# Patient Record
Sex: Female | Born: 1955
Health system: Southern US, Community
[De-identification: ages and names within clinical notes are randomized; demographics above are authoritative.]

## PROBLEM LIST (undated history)

## (undated) DIAGNOSIS — I471 Supraventricular tachycardia: Secondary | ICD-10-CM

## (undated) DIAGNOSIS — I4892 Unspecified atrial flutter: Secondary | ICD-10-CM

## (undated) DIAGNOSIS — H15101 Unspecified episcleritis, right eye: Secondary | ICD-10-CM

## (undated) DIAGNOSIS — R9430 Abnormal result of cardiovascular function study, unspecified: Secondary | ICD-10-CM

## (undated) DIAGNOSIS — R232 Flushing: Secondary | ICD-10-CM

## (undated) DIAGNOSIS — I480 Paroxysmal atrial fibrillation: Secondary | ICD-10-CM

## (undated) DIAGNOSIS — K52832 Lymphocytic colitis: Secondary | ICD-10-CM

## (undated) DIAGNOSIS — H15001 Unspecified scleritis, right eye: Secondary | ICD-10-CM

## (undated) DIAGNOSIS — Z9289 Personal history of other medical treatment: Secondary | ICD-10-CM

## (undated) DIAGNOSIS — I10 Essential (primary) hypertension: Secondary | ICD-10-CM

## (undated) HISTORY — DX: Lymphocytic colitis: K52.832

## (undated) HISTORY — DX: Paroxysmal atrial fibrillation: I48.0

## (undated) HISTORY — PX: WRIST SURGERY: SHX841

## (undated) HISTORY — DX: Personal history of other medical treatment: Z92.89

## (undated) HISTORY — DX: Supraventricular tachycardia: I47.1

## (undated) HISTORY — DX: Abnormal result of cardiovascular function study, unspecified: R94.30

## (undated) HISTORY — DX: Unspecified atrial flutter: I48.92

## (undated) HISTORY — DX: Flushing: R23.2

## (undated) HISTORY — DX: Unspecified episcleritis, right eye: H15.101

## (undated) HISTORY — DX: Essential (primary) hypertension: I10

---
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
## 1988-03-05 HISTORY — PX: VARICOSE VEIN SURGERY: SHX832

## 2012-08-28 ENCOUNTER — Ambulatory Visit (INDEPENDENT_AMBULATORY_CARE_PROVIDER_SITE_OTHER): Payer: BC Managed Care – PPO | Admitting: Family Medicine

## 2012-08-28 ENCOUNTER — Encounter: Payer: Self-pay | Admitting: Family Medicine

## 2012-08-28 VITALS — BP 124/80 | Temp 98.3°F | Ht 62.25 in | Wt 171.0 lb

## 2012-08-28 DIAGNOSIS — H15001 Unspecified scleritis, right eye: Secondary | ICD-10-CM

## 2012-08-28 DIAGNOSIS — J329 Chronic sinusitis, unspecified: Secondary | ICD-10-CM

## 2012-08-28 DIAGNOSIS — R232 Flushing: Secondary | ICD-10-CM

## 2012-08-28 DIAGNOSIS — N951 Menopausal and female climacteric states: Secondary | ICD-10-CM

## 2012-08-28 DIAGNOSIS — Z803 Family history of malignant neoplasm of breast: Secondary | ICD-10-CM | POA: Insufficient documentation

## 2012-08-28 DIAGNOSIS — Z7689 Persons encountering health services in other specified circumstances: Secondary | ICD-10-CM

## 2012-08-28 DIAGNOSIS — H15009 Unspecified scleritis, unspecified eye: Secondary | ICD-10-CM

## 2012-08-28 DIAGNOSIS — H15101 Unspecified episcleritis, right eye: Secondary | ICD-10-CM

## 2012-08-28 DIAGNOSIS — Z7189 Other specified counseling: Secondary | ICD-10-CM

## 2012-08-28 HISTORY — DX: Unspecified scleritis, right eye: H15.001

## 2012-08-28 HISTORY — DX: Unspecified episcleritis, right eye: H15.101

## 2012-08-28 MED ORDER — AMOXICILLIN 875 MG PO TABS: 875.0000 mg | ORAL_TABLET | Freq: Two times a day (BID) | ORAL | Status: DC

## 2012-08-28 NOTE — Addendum Note (Signed)
Addended by: Terressa Koyanagi on: 08/28/2012 12:10 PM   Modules accepted: Level of Service

## 2012-08-28 NOTE — Patient Instructions (Addendum)
-  schedule your mammogram  -schedule physical with pap and breast exam  -As we discussed, we have prescribed a new medication for you at this appointment. We discussed the common and serious potential adverse effects of this medication and you can review these and more with the pharmacist when you pick up your medication.  Please follow the instructions for use carefully and notify us immediately if you have any problems taking this medication.  -We placed a referral for you as discussed. It usually takes about 1-2 weeks to process and schedule this referral. If you have not heard from [place] regarding this appointment in 2 weeks please contact our office.  -schedule your physical with pap in the next 1-2 months

## 2012-08-28 NOTE — Progress Notes (Addendum)
Chief Complaint  Patient presents with  . Establish Care  . Medication Management  . URI    achy, fever, mucus,congestion     HPI:  Veronica Valencia is here to establish care. Was seeing Merri Brunette recently, but wants to come here as husband is here. Husband sees me and struggled with alcoholism and depression but is doing better.  Last PCP and physical: almost 2 years ago - pap and mammo and all normal - had cholesterol and basic labs about several months ago and was normal  Has the following chronic problems and concerns today:  Patient Active Problem List   Diagnosis Date Noted  . Hot flashes 08/28/2012  . FH: breast cancer in first degree relative 08/28/2012  . Scleritis and episcleritis of right eye 08/28/2012   Hot Flashes: -on HRT norethindrone and ethinyl estradiol, on this for about 1 year - takes daily -aware of risks and does want to continue for now  Sinus congestion: -started 3-4 weeks ago -symptoms: nasal congestion, cough, PND - seems to be getting better now, sinus pain, a little aches  Health Maintenance: -needs physical, UTD on vaccines  ROS: See pertinent positives and negatives per HPI.  Past Medical History  Diagnosis Date  . Hot flashes   . Episcleritis of right eye     Family History  Problem Relation Age of Onset  . Diabetes Mother   . Hypertension Mother   . Heart disease Father 46    MI  . Stroke Father   . Hypertension Father   . Cancer Sister 74    breast    History   Social History  . Marital Status: Married    Spouse Name: N/A    Number of Children: N/A  . Years of Education: N/A   Social History Main Topics  . Smoking status: Never Smoker   . Smokeless tobacco: None  . Alcohol Use: Yes     Comment: couple of glasses of wine daily   . Drug Use: No  . Sexually Active: None   Other Topics Concern  . 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 , Disp: , Rfl: ;  prednisoLONE acetate (PRED FORTE) 1 % ophthalmic suspension, Place 1 drop into the right eye 4 (four) times daily. , Disp: , Rfl:   EXAM:  Filed Vitals:   08/28/12 1119  BP: 124/80  Temp: 98.3 F (36.8 C)    Body mass index is 31.03 kg/(m^2).  GENERAL: vitals reviewed and listed above, alert, oriented, appears well hydrated and in no acute distress  HEENT: atraumatic, conjunttiva clear, no obvious abnormalities on inspection of external nose and ears, normal appearance of ear canals and TMs, clear nasal congestion, mild post oropharyngeal erythema with PND, no tonsillar edema or exudate, no sinus TTP  NECK: no obvious masses on inspection  LUNGS: clear to auscultation bilaterally, no wheezes, rales or rhonchi, good air movement  CV: HRRR, no peripheral edema  MS: moves all extremities without noticeable abnormality  PSYCH:  pleasant and cooperative, no obvious depression or anxiety  ASSESSMENT AND PLAN:  Discussed the following assessment and plan:  Encounter to establish care  FH: breast cancer in first degree relative - Plan: Ambulatory referral to Genetics  Sinusitis - Plan: amoxicillin (AMOXIL) 875 MG tablet  Hot flashes  Scleritis and episcleritis of right eye   -We reviewed the PMH, PSH, FH, SH, Meds and Allergies. -We provided refills for any medications we will prescribe as needed. -We addressed current concerns per orders and patient instructions. -We have asked for records for pertinent exams, studies, vaccines and notes from previous providers. -We have advised patient to follow up per instructions below. -amox for sinusitis - risks  and return precautions discussed -follow up for CPE with pap 45 minutes spent face to face with this patient   -Patient advised to return or notify a doctor immediately if symptoms worsen or persist or new concerns arise.  Patient Instructions  -schedule your mammogram  -schedule physical with pap and breast exam  -As we discussed, we have prescribed a new medication for you at this appointment. We discussed the common and serious potential adverse effects of this medication and you can review these and more with the pharmacist when you pick up your medication.  Please follow the instructions for use carefully and notify us immediately if you have any problems taking this medication.  -We placed a referral for you as discussed. It usually takes about 1-2 weeks to process and schedule this referral. If you have not heard from [place] regarding this appointment in 2 weeks please contact our office.  -schedule your physical with pap in the next 1-2 months        KIM, HANNAH R.

## 2012-09-02 DIAGNOSIS — I480 Paroxysmal atrial fibrillation: Secondary | ICD-10-CM

## 2012-09-02 HISTORY — DX: Paroxysmal atrial fibrillation: I48.0

## 2012-09-19 DIAGNOSIS — I480 Paroxysmal atrial fibrillation: Secondary | ICD-10-CM | POA: Insufficient documentation

## 2012-09-23 ENCOUNTER — Encounter (HOSPITAL_COMMUNITY): Payer: Self-pay | Admitting: *Deleted

## 2012-09-23 ENCOUNTER — Emergency Department (HOSPITAL_COMMUNITY)
Admission: EM | Admit: 2012-09-23 | Discharge: 2012-09-23 | Disposition: A | Discharge status: Home / Self Care | Payer: BC Managed Care – PPO | Attending: Emergency Medicine | Admitting: Emergency Medicine

## 2012-09-23 ENCOUNTER — Telehealth: Payer: Self-pay | Admitting: Genetic Counselor

## 2012-09-23 ENCOUNTER — Emergency Department (HOSPITAL_COMMUNITY): Payer: BC Managed Care – PPO

## 2012-09-23 DIAGNOSIS — I498 Other specified cardiac arrhythmias: Secondary | ICD-10-CM | POA: Insufficient documentation

## 2012-09-23 DIAGNOSIS — I471 Supraventricular tachycardia: Secondary | ICD-10-CM

## 2012-09-23 DIAGNOSIS — Z792 Long term (current) use of antibiotics: Secondary | ICD-10-CM | POA: Insufficient documentation

## 2012-09-23 DIAGNOSIS — Z79899 Other long term (current) drug therapy: Secondary | ICD-10-CM | POA: Insufficient documentation

## 2012-09-23 DIAGNOSIS — Z8669 Personal history of other diseases of the nervous system and sense organs: Secondary | ICD-10-CM | POA: Insufficient documentation

## 2012-09-23 DIAGNOSIS — Z8742 Personal history of other diseases of the female genital tract: Secondary | ICD-10-CM | POA: Insufficient documentation

## 2012-09-23 DIAGNOSIS — Z791 Long term (current) use of non-steroidal anti-inflammatories (NSAID): Secondary | ICD-10-CM | POA: Insufficient documentation

## 2012-09-23 LAB — CBC WITH DIFFERENTIAL/PLATELET
Basophils Absolute: 0.1 10*3/uL (ref 0.0–0.1)
Basophils Relative: 1 % (ref 0–1)
Eosinophils Absolute: 0.1 10*3/uL (ref 0.0–0.7)
Eosinophils Relative: 1 % (ref 0–5)
HCT: 38.2 % (ref 36.0–46.0)
Hemoglobin: 13.7 g/dL (ref 12.0–15.0)
Lymphocytes Relative: 15 % (ref 12–46)
Lymphs Abs: 0.9 10*3/uL (ref 0.7–4.0)
MCH: 33.7 pg (ref 26.0–34.0)
MCHC: 35.9 g/dL (ref 30.0–36.0)
MCV: 94.1 fL (ref 78.0–100.0)
Monocytes Absolute: 1 10*3/uL (ref 0.1–1.0)
Monocytes Relative: 18 % — ABNORMAL HIGH (ref 3–12)
Neutro Abs: 3.9 10*3/uL (ref 1.7–7.7)
Neutrophils Relative %: 66 % (ref 43–77)
Platelets: 162 10*3/uL (ref 150–400)
RBC: 4.06 MIL/uL (ref 3.87–5.11)
RDW: 12.4 % (ref 11.5–15.5)
WBC: 5.9 10*3/uL (ref 4.0–10.5)

## 2012-09-23 LAB — BASIC METABOLIC PANEL
BUN: 15 mg/dL (ref 6–23)
CO2: 22 mEq/L (ref 19–32)
Calcium: 9.7 mg/dL (ref 8.4–10.5)
Chloride: 101 mEq/L (ref 96–112)
Creatinine, Ser: 0.47 mg/dL — ABNORMAL LOW (ref 0.50–1.10)
GFR calc Af Amer: >90 mL/min (ref >90)
GFR calc non Af Amer: >90 mL/min (ref >90)
Glucose, Bld: 103 mg/dL — ABNORMAL HIGH (ref 70–99)
Potassium: 4.1 mEq/L (ref 3.5–5.1)
Sodium: 135 mEq/L (ref 135–145)

## 2012-09-23 LAB — TSH: TSH: 1.262 u[IU]/mL (ref 0.350–4.500)

## 2012-09-23 LAB — TROPONIN I: Troponin I: <0.3 ng/mL (ref <0.30)

## 2012-09-23 MED ORDER — METOPROLOL TARTRATE 12.5 MG HALF TABLET: 25.0000 mg | ORAL_TABLET | Freq: Two times a day (BID) | ORAL | Status: DC

## 2012-09-23 NOTE — ED Provider Notes (Signed)
History    CSN: [id number] Arrival date & time 09/23/12  4098  First MD Initiated Contact with Patient 09/23/12 [phone]     Chief Complaint  Patient presents with  . Resolved SVT    (Consider location/radiation/quality/duration/timing/severity/associated sxs/prior Treatment) HPI Comments: 57 year old female with no significant past medical history who presents with a complaint of palpitations and chest pain. She states that this has been intermittent over the last several days, nothing seems to make it better or worse, it was severe this morning and when the paramedics found the patient she was diaphoretic with a heart rate of 200. According to the paramedics cardiac tracings she was in an SVT, narrow complex, regular and this resolved with bearing down vagal maneuvers. She states that a normal day for her is drinking approximately 3 cups of coffee in the morning followed by 2 diet sodas later in the day and 2-3 glasses of alcohol in the evening. At this time the patient has no chest pain, no shortness of breath, no other symptoms. She does note having 2 loose stools earlier today. She has no history of cardiac disease, takes no upper prescription medications and has not been on any over-the-counter medications in the last 3 weeks after having a sinus infection that was treated with sinus medications as well as an antibiotic.  The history is provided by the patient.   Past Medical History  Diagnosis Date  . Hot flashes   . Episcleritis of right eye    Past Surgical History  Procedure Laterality Date  . Varicose vein surgery  1990  . Cesarean section     Family History  Problem Relation Age of Onset  . Diabetes Mother   . Hypertension Mother   . Heart disease Father 70    MI  . Stroke Father   . Hypertension Father   . Cancer Sister 70    breast   History  Substance Use Topics  . Smoking status: Never Smoker   . Smokeless tobacco: Not on file  . Alcohol Use: Yes     Comment:  couple of glasses of wine daily    OB History   Grav Para Term Preterm Abortions TAB SAB Ect Mult Living                 Review of Systems  All other systems reviewed and are negative.    Allergies  Review of patient's allergies indicates no known allergies.  Home Medications   Current Outpatient Rx  Name  Route  Sig  Dispense  Refill  . JINTELI 1-5 MG-MCG TABS   Oral   Take 1 tablet by mouth every morning.          . naproxen sodium (ANAPROX) 220 MG tablet   Oral   Take 220 mg by mouth 2 (two) times daily with a meal.         . prednisoLONE acetate (PRED FORTE) 1 % ophthalmic suspension   Right Eye   Place 1 drop into the right eye 4 (four) times daily.          Marland Kitchen amoxicillin (AMOXIL) 875 MG tablet   Oral   Take 1 tablet (875 mg total) by mouth 2 (two) times daily.   20 tablet   0   . metoprolol (LOPRESSOR) 12.5 mg TABS   Oral   Take 1 tablet (25 mg total) by mouth 2 (two) times daily.   60 tablet   1    BP  142/81  Pulse 64  Temp(Src) 98.7 F (37.1 C) (Oral)  Resp 20  SpO2 98% Physical Exam  Nursing note and vitals reviewed. Constitutional: She appears well-developed and well-nourished. No distress.  HENT:  Head: Normocephalic and atraumatic.  Mouth/Throat: Oropharynx is clear and moist. No oropharyngeal exudate.  Eyes: Conjunctivae and EOM are normal. Pupils are equal, round, and reactive to light. Right eye exhibits no discharge. Left eye exhibits no discharge. No scleral icterus.  Neck: Normal range of motion. Neck supple. No JVD present. No thyromegaly present.  Cardiovascular: Normal rate, regular rhythm, normal heart sounds and intact distal pulses.  Exam reveals no gallop and no friction rub.   No murmur heard. Pulmonary/Chest: Effort normal and breath sounds normal. No respiratory distress. She has no wheezes. She has no rales.  Abdominal: Soft. Bowel sounds are normal. She exhibits no distension and no mass. There is no tenderness.   Musculoskeletal: Normal range of motion. She exhibits no edema and no tenderness.  Lymphadenopathy:    She has no cervical adenopathy.  Neurological: She is alert. Coordination normal.  Skin: Skin is warm and dry. No rash noted. No erythema.  Psychiatric: She has a normal mood and affect. Her behavior is normal.    ED Course  Procedures (including critical care time) Labs Reviewed  BASIC METABOLIC PANEL - Abnormal; Notable for the following:    Glucose, Bld 103 (*)    Creatinine, Ser 0.47 (*)    All other components within normal limits  CBC WITH DIFFERENTIAL  ETHANOL  TROPONIN I  TSH   Dg Chest 2 View  09/23/2012   *RADIOLOGY REPORT*  Clinical Data: Shortness of breath, palpitations.  CHEST - 2 VIEW  Comparison: None  Findings: Heart is upper limits normal in size.  Lungs are clear. No effusions or edema.  No acute bony abnormality.  IMPRESSION: No acute cardiopulmonary disease.   Original Report Authenticated By: Charlett Nose, M.D.   1. SVT (supraventricular tachycardia)     MDM  At this time the patient is a normal cardiac exam, she has no signs of thyroid dysfunction including no weight loss, no changes in her hair, no subacute or chronic changes in her bowel habits. She is afebrile, normal blood pressure, normal heart rate and her EKG shows no signs of WPW or other arrhythmia. Laboratory workup pending, TSH drawn, patient informed that she will need to followup as an outpatient for further testing and cardiology referral. She will likely require low-dose beta blocker treatment for prevention. I've arty cautioned her on her ingestions including the amount of stimulant that she takes them everyday. She has expressed her understanding.  ED ECG REPORT  I personally interpreted this EKG   Date: 09/23/2012   Rate: 85  Rhythm: normal sinus rhythm  QRS Axis: left  Intervals: normal  ST/T Wave abnormalities: normal  Conduction Disutrbances:none  Narrative Interpretation:  - no WPW   Old EKG Reviewed: none available  Rhythm strips from EMS according to my interpretation show that she had a regular narrow complex tachycardia without obvious P waves.  The post vagal maneuvers ECG show normal sinus rhythm.  I have personally seen and interpreted the chest x-ray which is a 2 view PA and lateral, there is no signs of infiltrate asymmetry, pneumothorax, abnormal mediastinum or other significant abnormalities.  No further arrhythmias while in the ED - I prescribed metoprolol, the patient will be discharged. At this time we do not have a CBC vailable to [place] as the  machine is broken in the lab according to the lab technician.  The patient does not appear acutely anemic or in distress, her family doctor can followup on this test as well as her TSH.  Vida Roller, MD 09/23/12 1146

## 2012-09-23 NOTE — Progress Notes (Signed)
While in ED  Pt.'s friend requested that I visit with pt. to provide emotional, spiritual support and ministry of presence. Pt was experiencing  high levels of anxiety. I encouraged pt. through presence, conversation and listening. Friend is at bedside offering support and comfort.  Will follow as needed.   09/23/12 1100  Clinical Encounter Type  Visited With Patient;Health care provider;Other (Comment) (Personal Friend at bedside)  Visit Type Initial;Spiritual support;ED  Referral From Other (Comment) (Pt.'s friend)  Spiritual Encounters  Spiritual Needs Emotional  Stress Factors  Patient Stress Factors Exhausted  Veronica Valencia [place] [phone]

## 2012-09-23 NOTE — Telephone Encounter (Signed)
LVOM FOR PT TO RETURN CALL IN RE TO GENETIC APPT.  °

## 2012-09-23 NOTE — ED Notes (Signed)
Pt has been having couple days of intermittent chest pain and on arrival by ems pt was diaphoretic with HR 270.  HR decreased to 80 with bearing down.  Pt usually takes 3 cups coffee per day and diet sodas.

## 2012-09-26 ENCOUNTER — Encounter: Payer: Self-pay | Admitting: Family Medicine

## 2012-09-26 ENCOUNTER — Ambulatory Visit (INDEPENDENT_AMBULATORY_CARE_PROVIDER_SITE_OTHER): Payer: BC Managed Care – PPO | Admitting: Family Medicine

## 2012-09-26 VITALS — BP 100/70 | Temp 97.5°F | Wt 169.0 lb

## 2012-09-26 DIAGNOSIS — R232 Flushing: Secondary | ICD-10-CM

## 2012-09-26 DIAGNOSIS — N951 Menopausal and female climacteric states: Secondary | ICD-10-CM

## 2012-09-26 DIAGNOSIS — I498 Other specified cardiac arrhythmias: Secondary | ICD-10-CM

## 2012-09-26 DIAGNOSIS — R079 Chest pain, unspecified: Secondary | ICD-10-CM

## 2012-09-26 DIAGNOSIS — I471 Supraventricular tachycardia: Secondary | ICD-10-CM

## 2012-09-26 MED ORDER — NORETHINDRONE-ETH ESTRADIOL 1-5 MG-MCG PO TABS: 1.0000 | ORAL_TABLET | Freq: Every morning | ORAL | Status: DC

## 2012-09-26 NOTE — Progress Notes (Signed)
Chief Complaint  Patient presents with  . Hospitalization Follow-up    HPI:  ED follow up for tachycardia: -taken to ED on 09/23/12 by EMS for CP, back and neck pain, palpitations and per ED report diaphoretic with HR in 200 with narrow regular SVT -apparently this resolved with vagal maneuvers -EKG in ED was normal per report with NSR -labs/studies in ED included CXR,TSH, ethanol, CBC, BMP one set cardiac enzymes all ok -she was told needed outpt follow up scheduled with cardiology and wants referral -she reports: she reports had one more episode of racing heart when got home from ED, then then started metoprolol 12.5 bid; she wants to see a cardiologist for evaluation - she has had no further episodes since starting metoprolol and denies CP, SOB, DOE, palpitations, swelling -she has not been under more stress then usually; no anxiety or panic at the time, she was drinking 2 cups of coffee per day and 3 sodas - but she has cut back significantly - but had been doing this forever and had only had 1 cup of coffee the day this happened  ROS: See pertinent positives and negatives per HPI.  Past Medical History  Diagnosis Date  . Hot flashes   . Episcleritis of right eye     Family History  Problem Relation Age of Onset  . Diabetes Mother   . Hypertension Mother   . Heart disease Father 75    MI  . Stroke Father   . Hypertension Father   . Cancer Sister 2    breast    History   Social History  . Marital Status: Married    Spouse Name: N/A    Number of Children: N/A  . Years of Education: N/A   Social History Main Topics  . Smoking status: Never Smoker   . Smokeless tobacco: None  . Alcohol Use: Yes     Comment: couple of glasses of wine daily   . Drug Use: No  . Sexually Active: None   Other Topics Concern  . 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      Spiritual Beliefs: Christian       Lifestyle: hour of walking daily, free weights; healthy diet             Current outpatient prescriptions:metoprolol (LOPRESSOR) 12.5 mg TABS, Take 1 tablet (25 mg total) by mouth 2 (two) times daily., Disp: 60 tablet, Rfl: 1;  naproxen sodium (ANAPROX) 220 MG tablet, Take 220 mg by mouth 2 (two) times daily with a meal., Disp: , Rfl: ;  norethindrone-ethinyl estradiol (JINTELI) 1-5 MG-MCG TABS, Take 1 tablet by mouth every morning., Disp: 28 tablet, Rfl: 2 prednisoLONE acetate (PRED FORTE) 1 % ophthalmic suspension, Place 1 drop into the right eye 4 (four) times daily. , Disp: , Rfl:   EXAM:  Filed Vitals:   09/26/12 1020  BP: 100/70  Temp: 97.5 F (36.4 C)    Body mass index is 30.67 kg/(m^2).  GENERAL: vitals reviewed and listed above, alert, oriented, appears well hydrated and in no acute distress  HEENT: atraumatic, conjunttiva clear, no obvious abnormalities on inspection of external nose and ears  NECK: no obvious masses on inspection  LUNGS: clear to auscultation bilaterally, no wheezes, rales or rhonchi, good air movement  CV: HRRR, no peripheral edema  MS: moves all extremities without noticeable abnormality  PSYCH: pleasant and  cooperative, no obvious depression or anxiety  ASSESSMENT AND PLAN:  Discussed the following assessment and plan:  Chest pain - Plan: Ambulatory referral to Cardiology, CANCELED: Ambulatory referral to Cardiology  SVT (supraventricular tachycardia) - Plan: Ambulatory referral to Cardiology, CANCELED: Ambulatory referral to Cardiology -reviewed ED notes/labs -she is doing well today and feels fine; we discussed potential etiologies and will have her see the cardiologist - referral place -ED precuations  Hot flashes - Plan: norethindrone-ethinyl estradiol (JINTELI) 1-5 MG-MCG TABS -discussed risks including CV risks - but she reports life is unbearable without this due to severe hot flashes and begged to continue for now   -Patient  advised to return or notify a doctor immediately if symptoms worsen or persist or new concerns arise.  There are no Patient Instructions on file for this visit.   Kriste Basque R.

## 2012-09-30 ENCOUNTER — Telehealth: Payer: Self-pay | Admitting: Family Medicine

## 2012-09-30 NOTE — Telephone Encounter (Signed)
Please check the status of the referral for this patient

## 2012-09-30 NOTE — Telephone Encounter (Signed)
What is the status of the referral? Ensure it it is urgent.

## 2012-09-30 NOTE — Telephone Encounter (Signed)
Spoke with patient and she feels "okay now".  She has agreed if her symptoms return that she will go to the ER.

## 2012-09-30 NOTE — Telephone Encounter (Signed)
Caller: Veronica Valencia/Patient; Phone: [phone]; Reason for Call: Pt states she was seen in the ER on 09/23/12 for rapid heart rate (HR=210).  Pt was seen by Kriste Basque on 09/26/12.  Pt is going to have cardiology referral.  Pt is calling to report that this am she had fatigue and knot in chest.  BP was 145/98 and HR was 85 and irregular.  Pt states symptoms have resolved now.  Pt declines triage and does not want to come back to office.  Pt states she is calling to report her symptoms and see if referral can be expidited.  OFFICE, please follow up with patient

## 2012-09-30 NOTE — Telephone Encounter (Signed)
If having CP or irr heart beat needs to go to ED. Please see on check on status of referral and ensure urgent, but in meantime if CP or heart arrhythmia needs to go to ED.

## 2012-10-01 NOTE — Telephone Encounter (Signed)
Pt is scheduled for 7/31@ 10:00 at [facility].Lmom for pt

## 2012-10-02 ENCOUNTER — Ambulatory Visit: Payer: BC Managed Care – PPO | Admitting: Internal Medicine

## 2012-10-03 DIAGNOSIS — I4892 Unspecified atrial flutter: Secondary | ICD-10-CM

## 2012-10-03 HISTORY — DX: Unspecified atrial flutter: I48.92

## 2012-10-06 ENCOUNTER — Encounter: Payer: Self-pay | Admitting: Cardiology

## 2012-10-06 ENCOUNTER — Telehealth: Payer: Self-pay | Admitting: Internal Medicine

## 2012-10-06 ENCOUNTER — Ambulatory Visit (INDEPENDENT_AMBULATORY_CARE_PROVIDER_SITE_OTHER): Payer: BC Managed Care – PPO | Admitting: Cardiology

## 2012-10-06 VITALS — BP 128/82 | HR 133 | Ht 62.5 in | Wt 170.9 lb

## 2012-10-06 DIAGNOSIS — I471 Supraventricular tachycardia, unspecified: Secondary | ICD-10-CM | POA: Insufficient documentation

## 2012-10-06 DIAGNOSIS — I341 Nonrheumatic mitral (valve) prolapse: Secondary | ICD-10-CM

## 2012-10-06 DIAGNOSIS — R011 Cardiac murmur, unspecified: Secondary | ICD-10-CM

## 2012-10-06 DIAGNOSIS — I059 Rheumatic mitral valve disease, unspecified: Secondary | ICD-10-CM

## 2012-10-06 DIAGNOSIS — I498 Other specified cardiac arrhythmias: Secondary | ICD-10-CM

## 2012-10-06 DIAGNOSIS — I358 Other nonrheumatic aortic valve disorders: Secondary | ICD-10-CM | POA: Insufficient documentation

## 2012-10-06 DIAGNOSIS — I4892 Unspecified atrial flutter: Secondary | ICD-10-CM | POA: Insufficient documentation

## 2012-10-06 HISTORY — DX: Supraventricular tachycardia, unspecified: I47.10

## 2012-10-06 HISTORY — DX: Supraventricular tachycardia: I47.1

## 2012-10-06 MED ORDER — METOPROLOL TARTRATE 25 MG PO TABS: 25.0000 mg | ORAL_TABLET | Freq: Two times a day (BID) | ORAL | Status: DC

## 2012-10-06 NOTE — Telephone Encounter (Signed)
New problem   Tina/Dr Dorethea Clan stated Dr Dorethea Clan wanted pt to be seen ASAP the only appt i could give was 11/17/12 and I advised office I would give message to nurse and have someone look at notes in epic to determine if pt could be seen sooner. Please call Inetta Fermo concerning this matter.

## 2012-10-06 NOTE — Assessment & Plan Note (Signed)
Unusual that she would have EMS at the [facility], and shortly after had EKG suggestive of atrial flutter (even consideration atrial fibrillation). Although her EKG is read as atrial flutter, and does appear to be, her rapid heart rate broke with Valsalva and is very unusual for atrial flutter.  Plan: See above, increase beta blocker dose therapy, and referral to EP.

## 2012-10-06 NOTE — Assessment & Plan Note (Addendum)
I have no reason to doubt a heart rate in the 200 beats per minute range probably was not atrial flutter, and therefore was most likely a true SVT.  She's had at least 2 more shorter episodes while on beta blocker, presents here with an ECG showing atrial flutter.  I am concerned with her symptoms and fatigue associated with these episodes, and frequency over last couple weeks. He should be a great candidate to consider EP study with possible SVT plus or minus atrial flutter ablation.  Plan: Increase metoprolol 25 mg twice a day.  Referral to [facility] for evaluation and possible treatment/consideration of ablation. Reduce caffeine intake even further.   Ensure adequate hydration. CardioNet event monitor, determine her arrhythmia burden, rates, and variability.

## 2012-10-06 NOTE — Assessment & Plan Note (Signed)
I cannot be sure, due to the irregularity of her examination, however the different occasions and all holosystolic murmur with also +/- mitral click warrants evaluation with echocardiogram.

## 2012-10-06 NOTE — Assessment & Plan Note (Signed)
Her heart exam is actually relatively difficult because of how much change in the. When she was slow, it was a soft. Murmur and either a delayed split S2, S4 or a mitral click.  Simply based on her arrhythmias, with the potential for prolapse this should be investigated further.  Plan: Daily echocardiogram.

## 2012-10-06 NOTE — Patient Instructions (Addendum)
Your physician has recommended that you wear an event monitor. Event monitors are medical devices that record the heart's electrical activity. Doctors most often [place] these monitors to diagnose arrhythmias. Arrhythmias are problems with the speed or rhythm of the heartbeat. The monitor is a small, portable device. You can wear one while you do your normal daily activities. This is usually used to diagnose what is causing palpitations/syncope (passing out).  Your physician has requested that you have an echocardiogram. Echocardiography is a painless test that uses sound waves to create images of your heart. It provides your doctor with information about the size and shape of your heart and how well your heart's chambers and valves are working. This procedure takes approximately one hour. There are no restrictions for this procedure.   Change Metoprolol Tart 25 mg twice a day   You have been referred to [facility] Electrophysicologist-irregular heart beat

## 2012-10-06 NOTE — Progress Notes (Signed)
Patient ID: Veronica Valencia, female   DOB: 04-Jul-1955, 57 y.o.   MRN: [id number]  PCP: Terressa Koyanagi., DO  Clinic Note: No chief complaint on file.   HPI: Veronica Valencia is a 57 y.o. female with a PMH below who presents today for initial evaluation for rapid heart rates including supraventricular tachycardia as well as other episodes of irregular heartbeats and shortness of breath. She was initially seen in the emergency room on July 22, after being found to be in SVT by EMS with a heart rate in the 200s. This was relieved by vagal maneuvers. She noted that she had palpitations and chest discomfort for last several days before that., Usually at night when she was ready to go to sleep, or morning she was about ready to wake up. It gave her ER visit, she did note any/saw him morning followed by her usual 2 cans of diet soda. She also couple glasses all the usual course. She denied any additional social stressors or other areas of concern over the past weeks. Nothing unusual. She was discharged from the emergency room on Metoprolol Tartrate 12.5 mg twice a day.  Interval History: Since anesthesia a least 2 more occasions a prolonged fast heart rates, that didn't last as long the night she went to the hospital. Every night since then, she has had it is somewhat irregular heartbeats but not the long necessarily prolonged episodes. When the episodes do occur, she feels anxious short of breath, flushed, cool and clammy. She notes discomfort in her chest pressure.  When she got emergency room that night she told not talking. As you to one cup of coffee a and 1 diet drink (TAB) a day. In addition he is also noted that she can walk about 3 time and not really a problem, however when she is walking up hill or up steps she feels short of breath and fatigue. She does not get chest pain. She does as a result not do so activities use in the past. She simply tires easier than was her usual. She also describes having  episodes that are often 3 to 4 in the morning when she wakes up with a knot-like feeling in her chest that takes her breath away.  Because she wakes up with it, she is not sure if this just her feeling palpitations.  With these episodes, she denies feeling symptoms of of syncope, but does feel as if she may pass out.  The remainder of cardiac review of systems is as follows: Cardiovascular ROS: negative for - edema, loss of consciousness, murmur, orthopnea, paroxysmal nocturnal dyspnea or Lightheadedness, dizziness. No TIA or amaurosis fugax symptoms.  No melena, hematochezia.  Past Medical History  Diagnosis Date  . Hot flashes   . Episcleritis of right eye     Prior Cardiac Evaluation and Past Surgical History: Past Surgical History  Procedure Laterality Date  . Varicose vein surgery  1990  . Cesarean section     No Known Allergies  Current Outpatient Prescriptions  Medication Sig Dispense Refill  . metoprolol (LOPRESSOR) 12.5 mg TABS Take 1 tablet (25 mg total) by mouth 2 (two) times daily.  60 tablet  1  . naproxen sodium (ANAPROX) 220 MG tablet Take 220 mg by mouth as needed.       . norethindrone-ethinyl estradiol (JINTELI) 1-5 MG-MCG TABS Take 1 tablet by mouth every morning.  28 tablet  2  . metoprolol tartrate (LOPRESSOR) 25 MG tablet Take 1 tablet (25 mg  total) by mouth 2 (two) times daily.  60 tablet  11   No current facility-administered medications for this visit.    History   Social History  . Marital Status: Married    Spouse Name: N/A    Number of Children: N/A  . Years of Education: N/A   Occupational History  . Not on file.   Social History Main Topics  . Smoking status: Never Smoker   . Smokeless tobacco: Not on file  . Alcohol Use: Yes     Comment: couple of glasses of wine daily   . Drug Use: No  . Sexually Active: Not on file   Other Topics Concern  . Not on file   Social History Narrative   She is a married mother of 3. 30 years she had a  slow knee 14 days a week using 1.   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   Family History  Problem Relation Age of Onset  . Diabetes Mother   . Hypertension Mother   . Stroke Father   . Hypertension Father   . Cancer Sister 22    breast  . Heart attack Father 79   ROS: A comprehensive Review of Systems - Negative except Pertinent positives noted above. She did note having a few loose stools on the day she went to the emergency room.  PHYSICAL EXAM  BP 128/82  Pulse 133  Ht 5' 2.5" (1.588 m)  Wt 170 lb 14.4 oz (77.52 kg)  BMI 30.74 kg/m2; HR broke to SR / SBrady 55-60 during the visit.  General appearance: alert, cooperative, appears stated age, no distress and pleasant mood and affect; when questioning her further, she does note feeling somewhat clammy and lives breath with change in her chest, initial examination  HEENT: [place]/AT, EOMI, MMM, anicteric sclera  Neck: no adenopathy, no carotid bruit, no JVD, supple, symmetrical, trachea midline and thyroid not enlarged, symmetric, no tenderness/mass/nodules  Lungs: clear to auscultation bilaterally, normal percussion bilaterally and nonlabored air movement  Heart: On initial examination, she had a tachycardic regular rhythm that is too fast hear any extra heart sounds. Following carotid massage and call then became regular and somewhat slower, there is possible very soft murmur was mostly holosystolic in nature, this is also associated with either a split S2 or possible mitral click.  --> When I rechecked her symptoms later, she was back rapidly with leads in the 130s. This again was broken with Valsalva after several attempts; finally she went back rapidly and in written capture rhythm strip will possible atrial fibrillation however more likely to be longer walk and  or sinus or. She had a couple of PACs and has remained in sinus bradycardia/sinus rhythm until she left the office   Abdomen: soft, non-tender; bowel sounds normal; no masses,  no organomegaly  Extremities: extremities normal, atraumatic, no cyanosis or edema and she did feel somewhat cool and clammy on initial evaluation, but this is her heart rate with a normal this resolved  Pulses: 2+ and symmetric; initially rapid and very, however when regular with a normal.  Skin: Skin color, texture, turgor normal. No rashes or lesions  Neurologic: Alert and oriented X 3, normal strength and tone. Normal symmetric reflexes. Normal coordination and gait  [id number] today: Yes Rate:133 , Rhythm:  Atrial flutter with 21 block, left axis deviation, nonspecific IVCD. --> EKG she clearly appear to be atrial flutter, however it broke to sinus rhythm with vagal maneuvers, and carotid massage.  ECG at a computer as atrial flutter with very LOC, PVCs left he bases and there is a short pulse. ECG showed a similar finding but was read as atrial fibrillation with RVR. -- The ECGs are all scanned in Epic  Recent Labs: 09/23/2012  TSH 1.26  CBC: WBC 5.9, Hemoglobin 13.7, platelets 162  BMP: Sodium 135, potassium 4.41, CO2 22, BUN 15, creatinine 0.7, calcium 9.7, glucose 103  ASSESSMENT / PLAN: Very interesting presentation. Unfortunately I do not have any EMS strips showing her in SVT was broken by Valsalva. Here in the clinic she had clearly a similar spell, albeit not as fast.  Paroxysmal SVT (supraventricular tachycardia) I have no reason to doubt a heart rate in the 200 beats per minute range probably was not atrial flutter, and therefore was most likely a true SVT.  She's had at least 2 more shorter episodes while on beta blocker, presents here with an ECG showing atrial flutter.  I am concerned with her symptoms and fatigue associated with these episodes, and frequency over last couple weeks. He should  be a great candidate to consider EP study with possible SVT plus or minus atrial flutter ablation.  Plan: Increase metoprolol 25 mg twice a day.  Referral to [facility] for evaluation and possible treatment/consideration of ablation. Reduce caffeine intake even further.   Ensure adequate hydration. CardioNet event monitor, determine her arrhythmia burden, rates, and variability.  Atrial flutter Unusual that she would have EMS at the [facility], and shortly after had EKG suggestive of atrial flutter (even consideration atrial fibrillation). Although her EKG is read as atrial flutter, and does appear to be, her rapid heart rate broke with Valsalva and is very unusual for atrial flutter.  Plan: See above, increase beta blocker dose therapy, and referral to EP.  Murmur, cardiac Her heart exam is actually relatively difficult because of how much change in the. When she was slow, it was a soft. Murmur and either a delayed split S2, S4 or a mitral click.  Simply based on her arrhythmias, with the potential for prolapse this should be investigated further.  Plan: Daily echocardiogram.  MVP (mitral valve prolapse) I cannot be sure, due to the irregularity of her examination, however the different occasions and all holosystolic murmur with also +/- mitral click warrants evaluation with echocardiogram.    Orders Placed This Encounter  Procedures  . Ambulatory referral to Cardiac Electrophysiology    Referral Priority:  Routine    Referral Type:  Consultation    Referral Reason:  Specialty Services Required    Requested Specialty:  Cardiology    Number of Visits Requested:  1  . EKG 12-Lead  . Cardiac event monitor    SVT, AFLUTTER    Standing Status: Future     Number of Occurrences: 1     Standing Expiration Date: 10/06/2013    Order Specific Question:  Where should this test be performed    Answer:  [facility] [place]  . 2D Echocardiogram  without contrast    Possible MVP,murmur    Standing Status: Future     Number of Occurrences:      Standing Expiration Date: 10/06/2013    Order Specific Question:  Type of Echo    Answer:  Complete  Order Specific Question:  Where should this test be performed    Answer:  [facility]    Order Specific Question:  Reason for exam-Echo    Answer:  Murmur  785.2    Order Specific Question:  Reason for exam-Echo    Answer:  Atrial Flutter  427.32    Order Specific Question:  Reason for exam-Echo    Answer:  Other - See Comments Section   Meds ordered this encounter  Medications  . metoprolol tartrate (LOPRESSOR) 25 MG tablet    Sig: Take 1 tablet (25 mg total) by mouth 2 (two) times daily.    Dispense:  60 tablet    Refill:  11    Followup: 4-6 weeks  Veronica Valencia W. Herbie Baltimore, M.D., M.S. [facility] [address] [place], [place]  [address]  [phone] Pager # [phone]

## 2012-10-08 ENCOUNTER — Telehealth: Payer: Self-pay | Admitting: Cardiology

## 2012-10-08 DIAGNOSIS — I4892 Unspecified atrial flutter: Secondary | ICD-10-CM

## 2012-10-08 NOTE — Telephone Encounter (Signed)
High HR Afib 198 lasting 75 secs.  Will fax report

## 2012-10-08 NOTE — Telephone Encounter (Signed)
New onset Afib and High HR Afib >180 lasting ~33 secs.  Fax received and HR 85 and 100.  Call to [facility] and asked to fax report showing High HR Afib > 180.  Informed by Shanda Bumps that pt did not have High HR Afib.  Stated highest HR was 155 and lasted 11 secs.  Stated call was to report new onset Afib.  Message forwarded to Dr. Herbie Baltimore.  Will notify Extender this afternoon to view strip (on Triage Cart).

## 2012-10-08 NOTE — Telephone Encounter (Signed)
OK -> as long as the episodes are short, no acute Rx needed.   We just increased her BB dose.

## 2012-10-08 NOTE — Telephone Encounter (Signed)
No message

## 2012-10-10 NOTE — Telephone Encounter (Signed)
[facility]  High HR Afib 154 lasted 1 min 15 sec.  Will fax report.

## 2012-10-10 NOTE — Telephone Encounter (Signed)
No treatment at this time.  Continue to monitor.

## 2012-10-10 NOTE — Telephone Encounter (Signed)
Message forwarded to L. Annie Paras, NP for review.

## 2012-10-13 ENCOUNTER — Telehealth: Payer: Self-pay | Admitting: *Deleted

## 2012-10-13 NOTE — Telephone Encounter (Signed)
Instructed patient per Vernona Rieger, NP on medication changes. Lopressor 25mg  q8h (change from Lopressor 25mg  BID)

## 2012-10-13 NOTE — Telephone Encounter (Signed)
Per Nada Boozer, NP - patient needs to take Lopressor 25mg  q8h for frequent PAF; left VM on 10/13/12

## 2012-10-13 NOTE — Telephone Encounter (Signed)
Message copied by Lindell Spar on Mon Oct 13, 2012 12:55 PM ------      Message from: Leone Brand      Created: Mon Oct 13, 2012  9:52 AM       Have pt take lopressor 25 mg every 8 hours for more frequent episodes of PAF. ------

## 2012-10-13 NOTE — Telephone Encounter (Signed)
Message copied by Lindell Spar on Mon Oct 13, 2012 11:10 AM ------      Message from: Veronica Valencia      Created: Mon Oct 13, 2012  9:52 AM       Have pt take lopressor 25 mg every 8 hours for more frequent episodes of PAF. ------

## 2012-10-13 NOTE — Telephone Encounter (Signed)
Message copied by Lindell Spar on Mon Oct 13, 2012 10:01 AM ------      Message from: Leone Brand      Created: Mon Oct 13, 2012  9:52 AM       Have pt take lopressor 25 mg every 8 hours for more frequent episodes of PAF. ------

## 2012-10-14 ENCOUNTER — Ambulatory Visit (HOSPITAL_COMMUNITY)
Admission: RE | Admit: 2012-10-14 | Discharge: 2012-10-14 | Disposition: A | Discharge status: Home / Self Care | Payer: BC Managed Care – PPO | Source: Ambulatory Visit | Attending: Cardiovascular Disease | Admitting: Cardiovascular Disease

## 2012-10-14 DIAGNOSIS — I517 Cardiomegaly: Secondary | ICD-10-CM | POA: Insufficient documentation

## 2012-10-14 DIAGNOSIS — I379 Nonrheumatic pulmonary valve disorder, unspecified: Secondary | ICD-10-CM | POA: Insufficient documentation

## 2012-10-14 DIAGNOSIS — I4892 Unspecified atrial flutter: Secondary | ICD-10-CM | POA: Insufficient documentation

## 2012-10-14 DIAGNOSIS — R0609 Other forms of dyspnea: Secondary | ICD-10-CM | POA: Insufficient documentation

## 2012-10-14 DIAGNOSIS — I498 Other specified cardiac arrhythmias: Secondary | ICD-10-CM | POA: Insufficient documentation

## 2012-10-14 DIAGNOSIS — R0989 Other specified symptoms and signs involving the circulatory and respiratory systems: Secondary | ICD-10-CM | POA: Insufficient documentation

## 2012-10-14 DIAGNOSIS — I471 Supraventricular tachycardia: Secondary | ICD-10-CM

## 2012-10-14 DIAGNOSIS — R011 Cardiac murmur, unspecified: Secondary | ICD-10-CM

## 2012-10-14 DIAGNOSIS — I359 Nonrheumatic aortic valve disorder, unspecified: Secondary | ICD-10-CM | POA: Insufficient documentation

## 2012-10-14 DIAGNOSIS — I079 Rheumatic tricuspid valve disease, unspecified: Secondary | ICD-10-CM | POA: Insufficient documentation

## 2012-10-14 DIAGNOSIS — I341 Nonrheumatic mitral (valve) prolapse: Secondary | ICD-10-CM

## 2012-10-14 NOTE — Progress Notes (Signed)
[facility]   2D echo completed 10/14/2012.   Cindy Yecheskel Kurek, RDCS  

## 2012-10-21 ENCOUNTER — Encounter: Payer: Self-pay | Admitting: Family Medicine

## 2012-10-21 ENCOUNTER — Other Ambulatory Visit (HOSPITAL_COMMUNITY)
Admission: RE | Admit: 2012-10-21 | Discharge: 2012-10-21 | Disposition: A | Discharge status: Home / Self Care | Payer: BC Managed Care – PPO | Source: Ambulatory Visit | Attending: Family Medicine | Admitting: Family Medicine

## 2012-10-21 ENCOUNTER — Ambulatory Visit (INDEPENDENT_AMBULATORY_CARE_PROVIDER_SITE_OTHER): Payer: BC Managed Care – PPO | Admitting: Family Medicine

## 2012-10-21 VITALS — BP 130/80 | Temp 97.6°F | Ht 62.25 in | Wt 171.0 lb

## 2012-10-21 DIAGNOSIS — Z1151 Encounter for screening for human papillomavirus (HPV): Secondary | ICD-10-CM | POA: Insufficient documentation

## 2012-10-21 DIAGNOSIS — Z0000 Encounter for general adult medical examination without abnormal findings: Secondary | ICD-10-CM

## 2012-10-21 DIAGNOSIS — R232 Flushing: Secondary | ICD-10-CM

## 2012-10-21 DIAGNOSIS — I4892 Unspecified atrial flutter: Secondary | ICD-10-CM

## 2012-10-21 DIAGNOSIS — Z01419 Encounter for gynecological examination (general) (routine) without abnormal findings: Secondary | ICD-10-CM | POA: Insufficient documentation

## 2012-10-21 DIAGNOSIS — N951 Menopausal and female climacteric states: Secondary | ICD-10-CM

## 2012-10-21 DIAGNOSIS — I471 Supraventricular tachycardia: Secondary | ICD-10-CM

## 2012-10-21 LAB — LIPID PANEL
Cholesterol: 180 mg/dL (ref 0–200)
LDL Cholesterol: 114 mg/dL — ABNORMAL HIGH (ref 0–99)
Total CHOL/HDL Ratio: 4
VLDL: 24.8 mg/dL (ref 0.0–40.0)

## 2012-10-21 NOTE — Progress Notes (Signed)
Quick Note:  Left a detailed message for pt at designated cell phone number. ______ 

## 2012-10-21 NOTE — Progress Notes (Signed)
Chief Complaint  Patient presents with  . Annual Exam    HPI:  Here for CPE:  -Concerns today:  1) palpitations/irr heart beat: -saw [facility] cards - metop increased, referred to EP, echo, monitor -per phone notes, frequent PAF - metop increased to 25mg  q 8 -Echo ok  -reports: doing better, occ DOE - has appt with cards next week and with EP the week after -denies: frequent palpitations since increased metoprolol, CP, SOB  2)HRT for hot flashes: -severe hot flashes of HRT -wants to continue til after wedding in a few months then try taper off -understands risks  -Diet: variety of foods, balance and well rounded  -Taking vitamin D and calcium: multivitamin  -Exercise: yes, regular  -Diabetes and Dyslipidemia Screening: done about 1 year ago per her report and normal  -Hx of HTN: no  -Vaccines: UTD  -pap history: 2- 3 years ago normal per report, all normal in the past  -FDLMP: N/A postmenopausal, hot flashes on HRT - aware of risks and prefers to continue due to severe hot flashes when, no bleeding  -sexual activity: yes, female partner, no new partners  -wants STI testing: no  -FH breast, colon or ovarian ca: see FH -last mammo: can't rememeber last mammo -last colon canncer screening - had colonoscopy at age 76 and normal per her report  -Alcohol, Tobacco, drug use: see social history  Review of Systems - complete ros negative except where noted above  Past Medical History  Diagnosis Date  . Hot flashes   . Episcleritis of right eye     Family History  Problem Relation Age of Onset  . Diabetes Mother   . Hypertension Mother   . Stroke Father   . Hypertension Father   . Cancer Sister 78    breast  . Heart attack Father 62    History   Social History  . Marital Status: Married    Spouse Name: N/A    Number of Children: N/A  . Years of Education: N/A   Social History Main Topics  . Smoking status: Never Smoker   . Smokeless tobacco: None  . Alcohol  Use: Yes     Comment: couple of glasses of wine daily   . Drug Use: No  . Sexual Activity: None   Other Topics Concern  . 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    Current outpatient prescriptions:metoprolol tartrate (LOPRESSOR) 25 MG tablet, Take 25 mg by mouth 3 (three) times daily. , Disp: , Rfl: ;  naproxen sodium (ANAPROX) 220 MG tablet, Take 220 mg by mouth as needed. , Disp: , Rfl: ;  norethindrone-ethinyl estradiol (JINTELI) 1-5 MG-MCG TABS, Take 1 tablet by mouth every morning., Disp: 28 tablet, Rfl: 2  EXAM:  Filed Vitals:   10/21/12 0944  BP: 130/80  Temp: 97.6 F (36.4 C)    GENERAL: vitals reviewed and listed below, alert, oriented, appears well hydrated and in no acute distress  HEENT: head atraumatic, PERRLA, normal appearance of eyes, ears, nose and mouth. moist mucus membranes.  NECK: supple, no masses or lymphadenopathy  LUNGS: clear to auscultation bilaterally, no rales, rhonchi or wheeze  CV: HRRR, no peripheral edema or cyanosis, normal  pedal pulses  BREAST: normal appearance - no lesions or discharge, on palpation normal breast tissue without any suspicious masses  ABDOMEN: bowel sounds normal, soft, non tender to palpation, no masses, no rebound or guarding  GU: normal appearance of external genitalia - no lesions or masses, normal vaginal mucosa - no abnormal discharge, normal appearance of cervix - no lesions or abnormal discharge, no masses or tenderness on palpation of uterus and ovaries.  RECTAL: refused  SKIN: no rash or abnormal lesions  MS: normal gait, moves all extremities normally  NEURO: CN II-XII grossly intact, normal muscle strength and sensation to light touch on extremities  PSYCH: normal  affect, pleasant and cooperative  ASSESSMENT AND PLAN:  Discussed the following assessment and plan:  Preventative health care - Plan: Hemoglobin A1c, Lipid Panel  Hot flashes  Paroxysmal SVT (supraventricular tachycardia)  Atrial flutter   -Discussed and advised all [place] preventive services health task force level A and B recommendations for age, sex and risks.  -Advised at least 150 minutes of exercise per week and a healthy diet low in saturated fats and sweets and consisting of fresh fruits and vegetables, lean meats such as fish and white chicken and whole grains.  -labs, studies and vaccines per orders this encounter  -FASTING LABS  -doing well - following up with cards for PAF  -HRT, will taper off after wedding  Orders Placed This Encounter  Procedures  . Hemoglobin A1c  . Lipid Panel    Patient Instructions  -1000 IU Vit D3 daily; calcium 1200mg  total from diet and supplement  -call and schedule your mammogram - [phone]  -PLEASE SIGN UP FOR MYCHART TODAY   We recommend the following healthy lifestyle measures: - eat a healthy diet consisting of lots of vegetables, fruits, beans, nuts, seeds, healthy meats such as white chicken and fish and whole grains.  - avoid fried foods, fast food, processed foods, sodas, red meet and other fattening foods.  - get a least 150 minutes of aerobic exercise per week.   Follow up in: 4-6 months or as needed     Patient advised to return to clinic immediately if symptoms worsen or persist or new concerns.   No Follow-up on file.  Kriste Basque R.

## 2012-10-21 NOTE — Patient Instructions (Addendum)
-  1000 IU Vit D3 daily; calcium 1200mg  total from diet and supplement  -call and schedule your mammogram - [phone]  -PLEASE SIGN UP FOR MYCHART TODAY   We recommend the following healthy lifestyle measures: - eat a healthy diet consisting of lots of vegetables, fruits, beans, nuts, seeds, healthy meats such as white chicken and fish and whole grains.  - avoid fried foods, fast food, processed foods, sodas, red meet and other fattening foods.  - get a least 150 minutes of aerobic exercise per week.   Follow up in: 4-6 months or as needed

## 2012-10-21 NOTE — Addendum Note (Signed)
Addended by: Azucena Freed on: 10/21/2012 10:23 AM   Modules accepted: Orders

## 2012-10-24 NOTE — Progress Notes (Signed)
Quick Note:  Left a message for pt pap normal. ______

## 2012-10-27 ENCOUNTER — Telehealth: Payer: Self-pay | Admitting: *Deleted

## 2012-10-27 NOTE — Telephone Encounter (Signed)
Called patient. Per Dr Herbie Baltimore. She does not need to keep appointment on 8/26 because she has an appointment on 8/27 with Dr Ladona Ridgel. Reschedule appointment for 10 -14 days or next available - scheduler will call her.  Result given to patient about Echo. Weekly monitor reports will be scanned for office visit with Dr Ladona Ridgel.  Pt verbalized understanding.

## 2012-10-28 ENCOUNTER — Ambulatory Visit: Payer: BC Managed Care – PPO | Admitting: Cardiology

## 2012-10-28 ENCOUNTER — Encounter: Payer: Self-pay | Admitting: Cardiology

## 2012-10-29 ENCOUNTER — Encounter: Payer: Self-pay | Admitting: Internal Medicine

## 2012-10-29 ENCOUNTER — Ambulatory Visit (INDEPENDENT_AMBULATORY_CARE_PROVIDER_SITE_OTHER): Payer: BC Managed Care – PPO | Admitting: Internal Medicine

## 2012-10-29 VITALS — BP 127/82 | HR 81 | Ht 62.0 in | Wt 172.2 lb

## 2012-10-29 DIAGNOSIS — I4891 Unspecified atrial fibrillation: Secondary | ICD-10-CM

## 2012-10-29 MED ORDER — FLECAINIDE ACETATE 50 MG PO TABS: 50.0000 mg | ORAL_TABLET | Freq: Two times a day (BID) | ORAL | Status: DC

## 2012-10-29 MED ORDER — METOPROLOL TARTRATE 25 MG PO TABS: ORAL_TABLET | ORAL | Status: DC

## 2012-10-29 NOTE — Patient Instructions (Signed)
Your physician has requested that you have an exercise tolerance test. For further information please visit [email or web address] Please also follow instruction sheet, as given.  Your physician has recommended you make the following change in your medication:  1) Start Flecainide 50mg  ---take 1 1/2 tablets twice daily

## 2012-11-06 ENCOUNTER — Telehealth: Payer: Self-pay | Admitting: Cardiology

## 2012-11-06 NOTE — Telephone Encounter (Signed)
Per B. Hager, PA-C, Dr. Ladona Ridgel started pt on flecainide recently.  Asked that report be forwarded to Dr. Ladona Ridgel.  Report faxed and message routed to Dr. Lewayne Bunting.

## 2012-11-06 NOTE — Telephone Encounter (Deleted)
Message forwarded to [facility]. Berlinda Last, LPN to discuss w/ Dr. Alanda Amass.  This note and paper chart# [id number] placed on Dr. Kandis Cocking cart.

## 2012-11-06 NOTE — Telephone Encounter (Signed)
Fax received and Message forwarded to B. Leron Croak, PA-C for further instructions.  Report on cart.

## 2012-11-06 NOTE — Telephone Encounter (Signed)
Severe bradycardia at 33 for about 1 min at 4:20am.  Awaiting faxed report.

## 2012-11-17 ENCOUNTER — Ambulatory Visit: Payer: BC Managed Care – PPO | Admitting: Internal Medicine

## 2012-11-20 ENCOUNTER — Encounter: Payer: Self-pay | Admitting: Internal Medicine

## 2012-11-20 NOTE — Telephone Encounter (Signed)
Would not change medications yet unless she has symptomatic bradycardia. Nocturnal bradycardia is not clinically important.

## 2012-11-20 NOTE — Progress Notes (Signed)
HPI Veronica Valencia is referred today by Dr. Herbie Baltimore for evaluation of recurrent atrial arrhythmias. She is a pleasant 56 yo woman with a h/o palpitations including documented atrial fibrillation and atrial flutter. She is also thought to have SVT and paramedic reports have demonstrated SVT at 200/min which was terminated with vagal maneuvers/carotid massage. These episodes occur several times a week and may last nearly an hour though usually much less. She gets sob and chest pressure but no syncope. She has not taken any anti-arrhythmic medications.  No Known Allergies   Current Outpatient Prescriptions  Medication Sig Dispense Refill  . metoprolol tartrate (LOPRESSOR) 25 MG tablet Take 25 mg by mouth 2 (two) times daily.      . naproxen sodium (ANAPROX) 220 MG tablet Take 220 mg by mouth as needed.       . norethindrone-ethinyl estradiol (JINTELI) 1-5 MG-MCG TABS Take 1 tablet by mouth every morning.  28 tablet  2  . flecainide (TAMBOCOR) 50 MG tablet Take 1 tablet (50 mg total) by mouth 2 (two) times daily.  180 tablet  3   No current facility-administered medications for this visit.     Past Medical History  Diagnosis Date  . Hot flashes   . Episcleritis of right eye     ROS:   All systems reviewed and negative except as noted in the HPI.   Past Surgical History  Procedure Laterality Date  . Varicose vein surgery  1990  . Cesarean section       Family History  Problem Relation Age of Onset  . Diabetes Mother   . Hypertension Mother   . Stroke Father   . Hypertension Father   . Cancer Sister 61    breast  . Heart attack Father 3     History   Social History  . Marital Status: Married    Spouse Name: N/A    Number of Children: N/A  . Years of Education: N/A   Occupational History  . Not on file.   Social History Main Topics  . Smoking status: Never Smoker   . Smokeless tobacco: Not on file  . Alcohol Use: Yes     Comment: couple of glasses of wine daily    . Drug Use: No  . Sexual Activity: Not on file   Other Topics Concern  . Not on file   Social History Narrative   She is a married mother of 3.    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     BP 127/82  Pulse 81  Ht 5\' 2"  (1.575 m)  Wt 172 lb 3.2 oz (78.109 kg)  BMI 31.49 kg/m2  Physical Exam:  Well appearing overweight middle aged woman, NAD HEENT: Unremarkable Neck:  7 cm JVD, no thyromegally Back:  No CVA tenderness Lungs:  Clear with no wheezes HEART:  Regular rate rhythm, no murmurs, no rubs, no clicks Abd:  soft, positive bowel sounds, no organomegally, no rebound, no guarding Ext:  2 plus pulses, no edema, no cyanosis, no clubbing Skin:  No rashes no nodules Neuro:  CN II through XII intact, motor grossly intact  EKG - nsr  Assess/Plan:

## 2012-11-20 NOTE — Assessment & Plan Note (Signed)
The patient's ECG and cardiac monitor demonstrate both atrial fibrillation and atrial flutter. I suspect she may also have SVT which could be atrial tachycardia or even AVNRT. As she is symptomatic, I have recommended she begin taking low dose flecainide and a beta blocker. She could be a candidate for catheter ablation but would allow her to fail an anti-arrhythmic medication first.

## 2012-11-24 ENCOUNTER — Encounter: Payer: Self-pay | Admitting: Internal Medicine

## 2012-11-24 ENCOUNTER — Ambulatory Visit (INDEPENDENT_AMBULATORY_CARE_PROVIDER_SITE_OTHER): Payer: BC Managed Care – PPO | Admitting: Internal Medicine

## 2012-11-24 ENCOUNTER — Encounter: Payer: BC Managed Care – PPO | Admitting: Internal Medicine

## 2012-11-24 DIAGNOSIS — R0789 Other chest pain: Secondary | ICD-10-CM

## 2012-11-24 DIAGNOSIS — R9430 Abnormal result of cardiovascular function study, unspecified: Secondary | ICD-10-CM

## 2012-11-24 HISTORY — DX: Abnormal result of cardiovascular function study, unspecified: R94.30

## 2012-11-24 NOTE — Progress Notes (Signed)
Exercise Treadmill Test  Pre-Exercise Testing Evaluation Rhythm: sinus bradycardia  Rate: 43     Test  Exercise Tolerance Test Ordering MD: Lewayne Bunting, MD  Interpreting MD: Lewayne Bunting, MD  Unique Test No: 1  Treadmill:  1  Indication for ETT: chest pain - rule out ischemia  Contraindication to ETT: No   Stress Modality: exercise - treadmill  Cardiac Imaging Performed: non   Protocol: standard Bruce - maximal  Max BP:  197/91  Max MPHR (bpm):  165 85% MPR (bpm):  140  MPHR obtained (bpm):  131 % MPHR obtained:  79  Reached 85% MPHR (min:sec):  n/a Total Exercise Time (min-sec):  9:00  Workload in METS:  10.1 Borg Scale: 16  Reason ETT Terminated:  fatigue    ST Segment Analysis At Rest: normal ST segments - no evidence of significant ST depression With Exercise: no evidence of significant ST depression  Other Information Arrhythmia:  No Angina during ETT:  absent (0) Quality of ETT:  diagnostic  ETT Interpretation:  normal - no evidence of ischemia by ST analysis  Comments: Attenuated heart rate response due to beta blocker  Recommendations: Continue current meds.

## 2012-11-25 ENCOUNTER — Encounter: Payer: Self-pay | Admitting: Cardiology

## 2012-11-25 ENCOUNTER — Ambulatory Visit (INDEPENDENT_AMBULATORY_CARE_PROVIDER_SITE_OTHER): Payer: BC Managed Care – PPO | Admitting: Cardiology

## 2012-11-25 VITALS — BP 140/86 | HR 64 | Ht 62.5 in | Wt 173.3 lb

## 2012-11-25 DIAGNOSIS — R079 Chest pain, unspecified: Secondary | ICD-10-CM

## 2012-11-25 DIAGNOSIS — I471 Supraventricular tachycardia, unspecified: Secondary | ICD-10-CM

## 2012-11-25 DIAGNOSIS — R011 Cardiac murmur, unspecified: Secondary | ICD-10-CM

## 2012-11-25 DIAGNOSIS — I4891 Unspecified atrial fibrillation: Secondary | ICD-10-CM

## 2012-11-25 DIAGNOSIS — I480 Paroxysmal atrial fibrillation: Secondary | ICD-10-CM

## 2012-11-25 NOTE — Patient Instructions (Addendum)
Lets hope that with the current combination of medications, that we can keep things under control.  I will give you a Rx for Nitroglycerin to use on an as needed basis for the chest pain.  I don't think it is related to "fixed" heart artery disease since you did OK on the Stress Test, but it could be related to spasm.  Marykay Lex, MD  Your physician wants you to follow-up in: 3 You will receive a reminder letter in the mail two months in advance. If you don't receive a letter, please call our office to schedule the follow-up appointment.

## 2012-11-30 ENCOUNTER — Encounter: Payer: Self-pay | Admitting: Cardiology

## 2012-11-30 DIAGNOSIS — R079 Chest pain, unspecified: Secondary | ICD-10-CM | POA: Insufficient documentation

## 2012-11-30 NOTE — Assessment & Plan Note (Signed)
It would appear that she clearly has atrial fibrillation with very rapid rates. This may simply have been what was seen as opposed to SVT where with the rate in the 190s it would appear to be almost regular. Appreciate Dr. Lubertha Basque input and evaluation.

## 2012-11-30 NOTE — Assessment & Plan Note (Signed)
Improved symptoms with low-dose flecainide and increased metoprolol dose to 37.5 twice a day. She still is having some breakthrough episodes. Depending on how she is doing next followup we may consider increasing flecainide 100 twice a day

## 2012-11-30 NOTE — Assessment & Plan Note (Signed)
Not sure what to make of her episodes of chest pain especially with her doing so well on the stress test. She could have microvascular ischemia. I will give a try of sublingual nitroglycerin to see if this would help.

## 2012-11-30 NOTE — Assessment & Plan Note (Signed)
No real source noted on echocardiogram. Likely benign murmur, it could in a flow murmur with rapid heart rate.

## 2012-11-30 NOTE — Progress Notes (Signed)
PCP: Veronica Koyanagi., DO  Clinic Note: Chief Complaint  Patient presents with  . Follow-up stress test.    Hasn't had any chest pain since stress test, has experienced tingling and numbness at night in both hands.   HPI: Veronica Valencia is a 57 y.o. female with a PMH below who presents today for followup of atrial fibrillation/flutter. I first saw her back in August when she had frequent episodes of atrial fibrillation and flutter simply in the room during her visit. She had a history of SVT documented by EMS with heart rates as high as the 200 range. I referred her to Dr. Ladona Ridgel who has initiated flecainide therapy. She had a treadmill stress test which showed no evidence of ischemia. He decided not to initiate anticoagulation due to her age and low risk score.  She wore a monitor which showed extensive episodes of rapid atrial fibrillation with rates up to 190s. She also had bradycardia spells when first breaking from A. fib to sinus rhythm. She had occasionally rough 2 second pauses.  Interval History: Since her last visit, she is noted much less frequent episodes of palpitations. She has had this at least 3-4 episodes in the last week or so, associated with chest discomfort this past Wednesday she had a longer than usual episode. Then Saturday as well Thursday she noted episodes of chest discomfort which were associated with longitudinal and long. This Clinical research associate the time that she passed a treadmill stress test. Whether or apparatus, she has not had any symptoms of syncope or near-syncope. She denies any TIA or amaurosis fugax symptoms. No PND, orthopnea or edema.  Melena - no, hematochezia no; hematuria - no; nosebleeds - no; claudication - no  Past Medical History  Diagnosis Date  . Hot flashes   . Episcleritis of right eye   . Paroxysmal SVT (supraventricular tachycardia) 10/06/2012    This may have simply been misdiagnosed A. fib  . PAF (paroxysmal atrial fibrillation) July 2014  . H/O  echocardiogram 10/14/2012    EF 60-65% with mild concentric LVH. Normal wall motion. Grade 1 diastolic dysfunction. Trace aortic regurgitation. No clear-cut evidence of murmur source  . Exercise tolerance test abnormal 11/24/2012    Did not reach 85% maximum heart rate. No ischemic changes noted at 79%. Notably beta blocker was not held; no arrhythmias noted.   No Known Allergies  Current Outpatient Prescriptions  Medication Sig Dispense Refill  . flecainide (TAMBOCOR) 50 MG tablet Take 1 tablet (50 mg total) by mouth 2 (two) times daily.  180 tablet  3  . metoprolol tartrate (LOPRESSOR) 25 MG tablet Take 37.5 mg by mouth 2 (two) times daily.       . naproxen sodium (ANAPROX) 220 MG tablet Take 220 mg by mouth as needed.       . norethindrone-ethinyl estradiol (JINTELI) 1-5 MG-MCG TABS Take 1 tablet by mouth every morning.  28 tablet  2   No current facility-administered medications for this visit.    History   Social History Narrative   She is a married mother of 3.    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   ROS: A comprehensive Review of Systems - Negative except Palpitations above and: Endocrine ROS: positive for - hot flashes and Significant sweating  PHYSICAL EXAM  BP 140/86  Pulse 64  Ht 5' 2.5" (1.588 m)  Wt 173 lb 4.8 oz (78.608 kg)  BMI 31.17 kg/m2 General appearance: alert, cooperative, appears stated age, no distress and Healthy-appearing. Well-nourished well-groomed. Neck: no adenopathy, no carotid bruit, no JVD and supple, symmetrical, trachea midline Lungs: clear to auscultation bilaterally, normal percussion bilaterally and Nonlabored, good air movement Heart: regular rate and rhythm, S1, S2 normal, no murmur, click, rub or gallop and normal apical impulse Abdomen: soft, non-tender; bowel sounds  normal; no masses,  no organomegaly Extremities: extremities normal, atraumatic, no cyanosis or edema Pulses: 2+ and symmetric  WJX:BJYNWGNFA today: No Recent Labs: 10/21/2012:  TC 180, TG 124, HDL 41, LDL 114 --relatively at goal for her risk factors.  ASSESSMENT / PLAN: Paroxysmal atrial fibrillation Improved symptoms with low-dose flecainide and increased metoprolol dose to 37.5 twice a day. She still is having some breakthrough episodes. Depending on how she is doing next followup we may consider increasing flecainide 100 twice a day  Paroxysmal SVT (supraventricular tachycardia) It would appear that she clearly has atrial fibrillation with very rapid rates. This may simply have been what was seen as opposed to SVT where with the rate in the 190s it would appear to be almost regular. Appreciate Dr. Lubertha Basque input and evaluation.   Murmur, cardiac No real source noted on echocardiogram. Likely benign murmur, it could in a flow murmur with rapid heart rate.  Chest pain with low risk for cardiac etiology Not sure what to make of her episodes of chest pain especially with her doing so well on the stress test. She could have microvascular ischemia. I will give a try of sublingual nitroglycerin to see if this would help.   No orders of the defined types were placed in this encounter.   No orders of the defined types were placed in this encounter.    Followup: 3 months  Veronica Valencia, M.D., M.S. [facility] [address] [place], [place]  [address]  [phone] Pager # [phone]

## 2013-04-06 ENCOUNTER — Other Ambulatory Visit: Payer: Self-pay

## 2013-04-06 DIAGNOSIS — Z1231 Encounter for screening mammogram for malignant neoplasm of breast: Secondary | ICD-10-CM

## 2013-04-07 ENCOUNTER — Ambulatory Visit (INDEPENDENT_AMBULATORY_CARE_PROVIDER_SITE_OTHER): Payer: BC Managed Care – PPO | Admitting: Family Medicine

## 2013-04-07 ENCOUNTER — Encounter: Payer: Self-pay | Admitting: Family Medicine

## 2013-04-07 VITALS — BP 122/90 | Temp 98.2°F | Wt 190.0 lb

## 2013-04-07 DIAGNOSIS — N951 Menopausal and female climacteric states: Secondary | ICD-10-CM

## 2013-04-07 DIAGNOSIS — R232 Flushing: Secondary | ICD-10-CM

## 2013-04-07 DIAGNOSIS — Z23 Encounter for immunization: Secondary | ICD-10-CM

## 2013-04-07 MED ORDER — NORETHINDRONE-ETH ESTRADIOL 1-5 MG-MCG PO TABS: 1.0000 | ORAL_TABLET | Freq: Every morning | ORAL | Status: DC

## 2013-04-07 NOTE — Addendum Note (Signed)
Addended by: Colleen Can on: 04/07/2013 09:46 AM   Modules accepted: Orders

## 2013-04-07 NOTE — Progress Notes (Addendum)
Chief Complaint  Patient presents with  . Medication Refill    HPI:  Follow up:  Hot flashes/menopausal symptoms: -had wanted to taper off her hormones in the past -started hormone therapy in March 2012 with previous PCP -reports when misses a dose hot flashes are intolerable and decided she wants to continue this and understands risks -denies: CP, SOB, HA with aura  ROS: See pertinent positives and negatives per HPI.  Past Medical History  Diagnosis Date  . Hot flashes   . Episcleritis of right eye   . Paroxysmal SVT (supraventricular tachycardia) 10/06/2012    This may have simply been misdiagnosed A. fib  . PAF (paroxysmal atrial fibrillation) July 2014  . H/O echocardiogram 10/14/2012    EF 60-65% with mild concentric LVH. Normal wall motion. Grade 1 diastolic dysfunction. Trace aortic regurgitation. No clear-cut evidence of murmur source  . Exercise tolerance test abnormal 11/24/2012    Did not reach 85% maximum heart rate. No ischemic changes noted at 79%. Notably beta blocker was not held; no arrhythmias noted.    Past Surgical History  Procedure Laterality Date  . Varicose vein surgery  1990  . Cesarean section      Family History  Problem Relation Age of Onset  . Diabetes Mother   . Hypertension Mother   . Stroke Father   . Hypertension Father   . Cancer Sister 24    breast  . Heart attack Father 5    History   Social History  . Marital Status: Married    Spouse Name: N/A    Number of Children: N/A  . Years of Education: N/A   Social History Main Topics  . Smoking status: Never Smoker   . Smokeless tobacco: Never Used  . Alcohol Use: Yes     Comment: couple of glasses of wine daily   . Drug Use: No  . Sexual Activity: None   Other Topics Concern  . 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    Current outpatient prescriptions:flecainide (TAMBOCOR) 50 MG tablet, Take 1 tablet (50 mg total) by mouth 2 (two) times daily., Disp: 180 tablet, Rfl: 3;  metoprolol tartrate (LOPRESSOR) 25 MG tablet, Take 37.5 mg by mouth 2 (two) times daily. , Disp: , Rfl: ;  naproxen sodium (ANAPROX) 220 MG tablet, Take 220 mg by mouth as needed. , Disp: , Rfl:  norethindrone-ethinyl estradiol (JINTELI) 1-5 MG-MCG TABS, Take 1 tablet by mouth every morning., Disp: 84 tablet, Rfl: 3  EXAM:  Filed Vitals:   04/07/13 0912  BP: 122/90  Temp: 98.2 F (36.8 C)    Body mass index is 34.18 kg/(m^2).  GENERAL: vitals reviewed and listed above, alert, oriented, appears well hydrated and in no acute distress  HEENT: atraumatic, conjunttiva clear, no obvious abnormalities on inspection of external nose and ears  NECK: no obvious masses on inspection  MS: moves all extremities without noticeable abnormality  PSYCH: pleasant and cooperative, no obvious depression or anxiety  ASSESSMENT AND PLAN:  Discussed the following assessment and plan:  Hot flashes - Plan: norethindrone-ethinyl estradiol (JINTELI) 1-5 MG-MCG TABS, DISCONTINUED: norethindrone-ethinyl estradiol (JINTELI) 1-5 MG-MCG TABS  -discussed risks of HRT and she wishes to  continue - she will discuss with her cardiologist as well -HM: flu vaccine today, she will schedule her mammogram -she is going to schedule follow up with her cardiologist, but is doing great -Patient advised to return or notify a doctor immediately if symptoms worsen or persist or new concerns arise.  There are no Patient Instructions on file for this visit.   Colin Benton R.

## 2013-04-07 NOTE — Progress Notes (Signed)
Pre visit review using our clinic review tool, if applicable. No additional management support is needed unless otherwise documented below in the visit note. 

## 2013-04-22 ENCOUNTER — Ambulatory Visit
Admission: RE | Admit: 2013-04-22 | Discharge: 2013-04-22 | Disposition: A | Discharge status: Home / Self Care | Payer: BC Managed Care – PPO | Source: Ambulatory Visit

## 2013-04-22 DIAGNOSIS — Z1231 Encounter for screening mammogram for malignant neoplasm of breast: Secondary | ICD-10-CM

## 2013-05-12 ENCOUNTER — Other Ambulatory Visit: Payer: Self-pay | Admitting: Internal Medicine

## 2013-06-15 ENCOUNTER — Ambulatory Visit (INDEPENDENT_AMBULATORY_CARE_PROVIDER_SITE_OTHER): Payer: BC Managed Care – PPO | Admitting: Cardiology

## 2013-06-15 ENCOUNTER — Encounter: Payer: Self-pay | Admitting: Cardiology

## 2013-06-15 VITALS — BP 122/80 | HR 57 | Ht 63.0 in | Wt 183.3 lb

## 2013-06-15 DIAGNOSIS — R5381 Other malaise: Secondary | ICD-10-CM

## 2013-06-15 DIAGNOSIS — R5383 Other fatigue: Secondary | ICD-10-CM

## 2013-06-15 DIAGNOSIS — I471 Supraventricular tachycardia: Secondary | ICD-10-CM

## 2013-06-15 DIAGNOSIS — R079 Chest pain, unspecified: Secondary | ICD-10-CM

## 2013-06-15 DIAGNOSIS — I480 Paroxysmal atrial fibrillation: Secondary | ICD-10-CM

## 2013-06-15 DIAGNOSIS — I4892 Unspecified atrial flutter: Secondary | ICD-10-CM

## 2013-06-15 DIAGNOSIS — I4891 Unspecified atrial fibrillation: Secondary | ICD-10-CM

## 2013-06-15 NOTE — Patient Instructions (Signed)
Your physician wants you to follow-up in: 4 months with Dr. Ellyn Hack.  You will receive a reminder letter in the mail two months in advance. If you don't receive a letter, please call our office to schedule the follow-up appointment.

## 2013-06-18 ENCOUNTER — Encounter: Payer: Self-pay | Admitting: Cardiology

## 2013-06-18 DIAGNOSIS — R5383 Other fatigue: Secondary | ICD-10-CM | POA: Insufficient documentation

## 2013-06-18 NOTE — Progress Notes (Signed)
PCP: Lucretia Kern., DO  Clinic Note: Chief Complaint  Patient presents with  . 6 MONTH VISIT     NO CHEST PAIN , LESS ENERGITIC, SOB OCCASWALKING UP HILL, HAVE NOT NOTICE FAST HEART RATE SINCE INCREASE MEDICATION,    HPI: Veronica Valencia is a 58 y.o. female with a Cardiovascular Problem List below who presents today for followup of her arrhythmias including atrial fibrillation/flutter. I saw her back in August of last year for what looked like SVT, then during my evaluation she had intermittent spells of SVT as well as atrial fibrillation atrial flutter. She was referred to Dr. Crissie Sickles. He started her on flecainide in addition to the metoprolol. Her metoprolol dose was increased to 37.5 twice a day, and her episodes of all but on the way with the exception of a few intermittent spells only lasting a few seconds. She was evaluated with an echocardiogram and a treadmill stress test which showed chronotropic incompetence likely related to medications.  Interval History: Her main issue today is she describes today is that she notes significantly reduced exercise tolerance and fatigue. Also mildly reduced libido. She denies any really significant rapid or irregular heartbeats (may be less than 5 in last 6 months). She denies any chest tightness or pressure with rest or exertion, but does note occasionally having dyspnea with walking up a hill or trying to exert herself. Otherwise her Cardiovascular ROS is as follows:  No PND, orthopnea or edema.  No  lightheadedness, dizziness, weakness or syncope/near syncope. No TIA/amaurosis fugax symptoms. No melena, hematochezia, hematuria, or epistaxis No claudication  Past Medical History  Diagnosis Date  . Hot flashes   . Episcleritis of right eye   . Paroxysmal SVT (supraventricular tachycardia) 10/06/2012    This may have simply been misdiagnosed A. fib  . PAF (paroxysmal atrial fibrillation) July 2014  . Paroxysmal atrial flutter August 2014  .  H/O echocardiogram OS 20/40    EF 60-65% with mild concentric LVH. Normal wall motion. Grade 1 diastolic dysfunction. Trace aortic regurgitation. No clear-cut evidence of murmur source  . Abnormal exercise tolerance test 11/24/2012    Did not reach 85% maximum heart rate. No ischemic changes noted at 79%. Notably beta blocker was not held; no arrhythmias noted.   MEDICATIONS and ALLERGIES reviewed in Epic  SOCIAL and FAMILY HISTORY reviewed in Epic  ROS: A comprehensive Review of Systems - Negative except Noted in the history of present illness. She is suffering from hot flashes.  Had one episode with some blood in her stool with wiping.; Had some issues with difficulty sleeping. Doing very and well weight using diphenhydramine.  PHYSICAL EXAM BP 122/80  Pulse 57  Ht 5\' 3"  (1.6 m)  Wt 183 lb 4.8 oz (83.144 kg)  BMI 32.48 kg/m2 General appearance: alert, cooperative, appears stated age, no distress and mildly obese Neck: no adenopathy, no carotid bruit and no JVD HEENT: [place]/AT, EOMI, MMM, anicteric sclera Lungs: clear to auscultation bilaterally, normal percussion bilaterally and non-labored Heart: regular rate and rhythm, S1, S2 normal, no murmur, click, rub or gallop; nondisplaced PMI Abdomen: soft, non-tender; bowel sounds normal; no masses,  no organomegaly; Extremities: extremities normal, atraumatic, no cyanosis, and no edema; Pulses: 2+ and symmetric;  Neurologic: Mental status: Alert, oriented, thought content appropriate; Cranial nerves: II-XII grossly intact   Adult ECG Report  Rate: 57 ;  Rhythm: sinus bradycardia  QRS Axis, PR Interval,  QRS Duration, QTc,  Voltages: Normal  Conduction Disturbances: none  Other Abnormalities: none   Narrative Interpretation:  sinus bradycardia but otherwise normal.  Recent Labs: none available   ASSESSMENT / PLAN: Fatigue Think a lot of her exertional dyspnea and fatigue decreased exercise tolerance is probably related to medication  mediated chronotropic incompetence. The metoprolol dose maybe a little much for her. I will notify Dr. Lovena Le to see you if you would potentially consider switching her to acebutolol from metoprolol which has less side effect profile.  If he is in agreement, we will notify her and change in medication.  Paroxysmal SVT (supraventricular tachycardia) Stable on current medications. Appreciate Dr. Tanna Furry input. It seems it is recommended certainly minimize if not removed her recurrences.  Paroxysmal atrial fibrillation /flutter No more breakthrough episodes on current medications. As noted above, may need to consider adjusting her beta blocker medication dose or switching to acebutolol. If we do change to acebutolol I would defer starting dose to Dr. Lovena Le.  Chest pain with low risk for cardiac etiology As she tolerated the heart rates in the high 190s to 200s without having chest discomfort, I doubt seriously she has significant ischemic coronary disease. Her stress test was abnormal syncope she failed to reach target heart rate. She is not noticing much of his discomfort symptoms at all now, though they may very well have been related to her arrhythmias.    Orders Placed This Encounter  Procedures  . EKG 12-Lead    Order Specific Question:  Where should this test be performed    Answer:  [facility]   Meds ordered this encounter  Medications  . aspirin EC 81 MG tablet    Sig: Take 81 mg by mouth daily.  . DiphenhydrAMINE HCl, Sleep, (ZZZQUIL) 25 MG CAPS    Sig: Take 50 mg by mouth at bedtime as needed.  . Naproxen Sodium (ALEVE PO)    Sig: Take by mouth as needed.    Followup: 4 months  Meggen Spaziani W. Ellyn Hack, M.D., M.S. Interventional Cardiologist [facility]

## 2013-06-18 NOTE — Assessment & Plan Note (Signed)
Think a lot of her exertional dyspnea and fatigue decreased exercise tolerance is probably related to medication mediated chronotropic incompetence. The metoprolol dose maybe a little much for her. I will notify Dr. Lovena Le to see you if you would potentially consider switching her to acebutolol from metoprolol which has less side effect profile.  If he is in agreement, we will notify her and change in medication.

## 2013-06-18 NOTE — Assessment & Plan Note (Signed)
Stable on current medications. Appreciate Dr. Tanna Furry input. It seems it is recommended certainly minimize if not removed her recurrences.

## 2013-06-18 NOTE — Assessment & Plan Note (Signed)
As she tolerated the heart rates in the high 190s to 200s without having chest discomfort, I doubt seriously she has significant ischemic coronary disease. Her stress test was abnormal syncope she failed to reach target heart rate. She is not noticing much of his discomfort symptoms at all now, though they may very well have been related to her arrhythmias.

## 2013-06-18 NOTE — Assessment & Plan Note (Addendum)
No more breakthrough episodes on current medications. As noted above, may need to consider adjusting her beta blocker medication dose or switching to acebutolol. If we do change to acebutolol I would defer starting dose to Dr. Lovena Le.

## 2013-06-23 NOTE — Telephone Encounter (Signed)
error 

## 2013-07-02 ENCOUNTER — Telehealth: Payer: Self-pay | Admitting: Cardiology

## 2013-07-02 MED ORDER — METOPROLOL TARTRATE 25 MG PO TABS: ORAL_TABLET | ORAL | Status: DC

## 2013-07-02 NOTE — Telephone Encounter (Signed)
Pt says she have been waiting on a prescription to be called in for Metoprolol ?mg,please call to [facility] on [address].

## 2013-07-02 NOTE — Telephone Encounter (Signed)
Last refill not sent by our office and refill request may have gone to [facility] location.   Returned call and pt verified x 2.  Pt informed message received.  Script was sent on 3.11.15 for #90, which is a 30-day supply.  Pt wants 90-day supply.  Informed RN will correct script and send in now.  Pt verbalized understanding and agreed w/ plan.  Refill(s) sent to pharmacy.

## 2013-08-27 ENCOUNTER — Telehealth: Payer: Self-pay | Admitting: Cardiology

## 2013-08-27 NOTE — Telephone Encounter (Signed)
Yes - lets try converting from Metoprolol 12.5 mg bid  To Acebutalol 200 mg bid (but start as QD). Try to increase to BID after ~6 days.  If that doesn't work - will try Atenolol 25 mg.  Leonie Man, MD

## 2013-08-27 NOTE — Telephone Encounter (Signed)
Want to talk to nurse regarding her medication.  Thought it was going to be changed.  Please call

## 2013-08-27 NOTE — Telephone Encounter (Signed)
She and Dr. Ellyn Hack talked about  Maybe changing her meds during her last visit in April.  Per note Dr. Ellyn Hack wanted to talk with Dr. Lovena Le first and maybe have Dr. Lovena Le dose any new med.  Still feeling fatigued and has gained another 7 lbs. Since her last visit.  Will send this info to Dr. Ellyn Hack for advise. Patient voiced understanding.

## 2013-08-27 NOTE — Telephone Encounter (Signed)
Note sent to Dr. Ellyn Hack for response.

## 2013-08-28 MED ORDER — ACEBUTOLOL HCL 200 MG PO CAPS: 200.0000 mg | ORAL_CAPSULE | Freq: Two times a day (BID) | ORAL | Status: DC

## 2013-08-28 NOTE — Telephone Encounter (Signed)
Left message to call back  

## 2013-08-28 NOTE — Telephone Encounter (Signed)
Note sent to Trixie Dredge, RN to advise patient.

## 2013-08-28 NOTE — Telephone Encounter (Signed)
Spoke to patient.Instruction given. New prescription e-sent. Patient aware to take one tablet daily for 6 days then twice a day

## 2013-09-08 ENCOUNTER — Ambulatory Visit: Payer: BC Managed Care – PPO | Admitting: Family Medicine

## 2013-09-08 ENCOUNTER — Telehealth: Payer: Self-pay | Admitting: *Deleted

## 2013-09-08 ENCOUNTER — Ambulatory Visit (INDEPENDENT_AMBULATORY_CARE_PROVIDER_SITE_OTHER): Payer: BC Managed Care – PPO | Admitting: Family Medicine

## 2013-09-08 ENCOUNTER — Telehealth: Payer: Self-pay | Admitting: Internal Medicine

## 2013-09-08 ENCOUNTER — Encounter: Payer: Self-pay | Admitting: Family Medicine

## 2013-09-08 VITALS — BP 104/80 | HR 64 | Temp 97.6°F | Ht 63.0 in | Wt 193.0 lb

## 2013-09-08 DIAGNOSIS — M546 Pain in thoracic spine: Secondary | ICD-10-CM

## 2013-09-08 DIAGNOSIS — R079 Chest pain, unspecified: Secondary | ICD-10-CM

## 2013-09-08 DIAGNOSIS — R635 Abnormal weight gain: Secondary | ICD-10-CM

## 2013-09-08 DIAGNOSIS — K219 Gastro-esophageal reflux disease without esophagitis: Secondary | ICD-10-CM

## 2013-09-08 DIAGNOSIS — I484 Atypical atrial flutter: Secondary | ICD-10-CM

## 2013-09-08 DIAGNOSIS — I4892 Unspecified atrial flutter: Secondary | ICD-10-CM

## 2013-09-08 LAB — TSH: TSH: 1.57 u[IU]/mL (ref 0.35–4.50)

## 2013-09-08 LAB — T4, FREE: Free T4: 0.88 ng/dL (ref 0.60–1.60)

## 2013-09-08 MED ORDER — CYCLOBENZAPRINE HCL 5 MG PO TABS: 5.0000 mg | ORAL_TABLET | Freq: Three times a day (TID) | ORAL | Status: DC | PRN

## 2013-09-08 NOTE — Patient Instructions (Addendum)
-  heat and exercises for back pain  -We placed a referral for you as discussed. It usually takes about 1-2 weeks to process and schedule this referral. If you have not heard from [place] regarding this appointment in 2 weeks please contact our office.  -please let your cardiologist know about your chest pain since changing your medication - I will ask my assistant to get amessage to your cardiologist as well, seek emergency care if symptoms recur or worsen  -tylenol 1000mg  up to three times daily  -muscle relaxer up to 3 times daily  -prilosec 20mg  daily for 1 month  -follow up in 1 month for annual exam and follow up

## 2013-09-08 NOTE — Telephone Encounter (Signed)
New message    Patient was seen by Dr. Maudie Mercury today off / on .    Since beta blocker was change patient having atypical chest pain.    MD is requesting someone to call patient with recommendation at to her atypical chest pain.

## 2013-09-08 NOTE — Telephone Encounter (Signed)
Patient of Dr. Darcus Pester - forwarded to Trixie Dredge, RN.

## 2013-09-08 NOTE — Progress Notes (Signed)
Pre visit review using our clinic review tool, if applicable. No additional management support is needed unless otherwise documented below in the visit note. 

## 2013-09-08 NOTE — Progress Notes (Signed)
No chief complaint on file.   HPI:  Follow up:  Severe hot flashes/menopausal symptoms: -started on HRT in 2012 by prior PCP, tried taper off and hot flashes unbearable 2015 -reports: doing better in terms of hot flashes -denies: CP, SOB, swelling, migraine with aura  Hx of A. Fib/A. Flutter, CP - has seen cardiologist for this: -followed by cardiology, reviewed recent notes -meds: asa, acebutolol, flecainide -per notes, some fatigue from medications (BB) (per phone notes changed from metoprolol to acebutalol recently -reports: since change in medication - intermittent more frequent chest discomfort mainly at night and at rest, never with activity, usually last all night, L chest -she has been having acid reflux - not taking anything for this -denies: SOB, palpitations, swelling, jaw pain -walking five miles per day - no cp or DOE with activity  Strained IT band and L knee pain: -seeing Dr. Berenice Primas for this -walking 5 miles per day  Back pain: -on and off in mid back, fell yesterday when tripped over dog gate and caught herself with L arm -then had L upper back and arm pain constant, severe, called EMS  - EKG normal, vitals normal and told they thought it was musculoskeletal so she opted not to go to the hospital  Weight gain: -over last year - wants to check thyroid -denies: swelling, SOB, changes in diet or exercise  ROS: See pertinent positives and negatives per HPI.  Past Medical History  Diagnosis Date  . Hot flashes   . Episcleritis of right eye   . Paroxysmal SVT (supraventricular tachycardia) 10/06/2012    This may have simply been misdiagnosed A. fib  . PAF (paroxysmal atrial fibrillation) July 2014  . Paroxysmal atrial flutter August 2014  . H/O echocardiogram OS 20/40    EF 60-65% with mild concentric LVH. Normal wall motion. Grade 1 diastolic dysfunction. Trace aortic regurgitation. No clear-cut evidence of murmur source  . Abnormal exercise tolerance test  11/24/2012    Did not reach 85% maximum heart rate. No ischemic changes noted at 79%. Notably beta blocker was not held; no arrhythmias noted.    Past Surgical History  Procedure Laterality Date  . Varicose vein surgery  1990  . Cesarean section      Family History  Problem Relation Age of Onset  . Diabetes Mother   . Hypertension Mother   . Stroke Father   . Hypertension Father   . Cancer Sister 4    breast  . Heart attack Father 22    History   Social History  . Marital Status: Married    Spouse Name: N/A    Number of Children: N/A  . Years of Education: N/A   Social History Main Topics  . Smoking status: Never Smoker   . Smokeless tobacco: Never Used  . Alcohol Use: Yes     Comment: couple of glasses of wine daily   . Drug Use: No  . Sexual Activity: None   Other Topics Concern  . 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    Current outpatient prescriptions:acebutolol (SECTRAL) 200 MG capsule, Take 1 capsule (200 mg total) by mouth 2 (two) times daily., Disp: 60 capsule, Rfl: 11;  aspirin  EC 81 MG tablet, Take 81 mg by mouth daily., Disp: , Rfl: ;  flecainide (TAMBOCOR) 50 MG tablet, Take 1 tablet (50 mg total) by mouth 2 (two) times daily., Disp: 180 tablet, Rfl: 3 norethindrone-ethinyl estradiol (JINTELI) 1-5 MG-MCG TABS, Take 1 tablet by mouth every morning., Disp: 84 tablet, Rfl: 3;  cyclobenzaprine (FLEXERIL) 5 MG tablet, Take 1 tablet (5 mg total) by mouth 3 (three) times daily as needed for muscle spasms., Disp: 30 tablet, Rfl: 1  EXAM:  Filed Vitals:   09/08/13 1046  BP: 104/80  Pulse: 64  Temp: 97.6 F (36.4 C)    Body mass index is 34.2 kg/(m^2).  GENERAL: vitals reviewed and listed above, alert, oriented,  appears well hydrated and in no acute distress  HEENT: atraumatic, conjunttiva clear, no obvious abnormalities on inspection of external nose and ears  NECK: no obvious masses on inspection  LUNGS: clear to auscultation bilaterally, no wheezes, rales or rhonchi, good air movement  CV: HRRR, no peripheral edem  MS: moves all extremities without noticeable abnormality, TTP L rhomboid, no bony TTP, no UE weakness  PSYCH: pleasant and cooperative, no obvious depression or anxiety  ASSESSMENT AND PLAN:  Discussed the following assessment and plan:  Left-sided thoracic back pain - Plan: cyclobenzaprine (FLEXERIL) 5 MG tablet, Ambulatory referral to Orthopedic Surgery -reproduced with palpation of rhomboid muscles, likely muscular -HEP, tylenol, heat, flexeril and referral to ortho per her request, return and ed precuations  Atypical atrial flutter, Chest pain with low risk for cardiac etiology -more likely GERD, but with hx advise her cardiologist weight in on this - will have assistant get message to her cardiologist to see if they feel could be related to change in medication and recs -start prilosec daily, she was instructed to contact her cardiologist as well and to seek emergency care if recurs/worsening or other symptoms  Gastroesophageal reflux disease, esophagitis presence not specified  Weight gain - Plan: TSH, T4, Free  -Patient advised to return or notify a doctor immediately if symptoms worsen or persist or new concerns arise.  Patient Instructions  -heat and exercises for back pain  -We placed a referral for you as discussed. It usually takes about 1-2 weeks to process and schedule this referral. If you have not heard from [place] regarding this appointment in 2 weeks please contact our office.  -please let your cardiologist know about your chest pain since changing your medication - I will ask my assistant to get amessage to your cardiologist as well, seek emergency care if  symptoms recur or worsen  -tylenol 1000mg  up to three times daily  -muscle relaxer up to 3 times daily  -prilosec 20mg  daily for 1 month  -follow up in 1 month for annual exam and follow up       Colin Benton R.

## 2013-09-08 NOTE — Telephone Encounter (Signed)
Left message for patient to call back - discussing  Her chest discomfort - from PCP

## 2013-09-08 NOTE — Telephone Encounter (Signed)
Per Dr Maudie Mercury I called Dr Tanna Furry (cardiolgist) office at [phone] as they are treating the pt for a-fib and flutter and left a message with Jari Sportsman to inform Dr Lovena Le the pt has had atypical chest pain since she changed beta-blockers and Dr Maudie Mercury recommended their office call the pt with recommendations for her.

## 2013-09-21 ENCOUNTER — Ambulatory Visit: Payer: BC Managed Care – PPO | Admitting: Cardiology

## 2013-10-22 ENCOUNTER — Other Ambulatory Visit: Payer: Self-pay | Admitting: Internal Medicine

## 2013-11-24 ENCOUNTER — Other Ambulatory Visit: Payer: Self-pay | Admitting: Cardiology

## 2013-11-24 NOTE — Telephone Encounter (Signed)
Rx was sent to pharmacy electronically. 

## 2014-01-04 ENCOUNTER — Ambulatory Visit (INDEPENDENT_AMBULATORY_CARE_PROVIDER_SITE_OTHER): Payer: BC Managed Care – PPO | Admitting: Cardiology

## 2014-01-04 ENCOUNTER — Encounter: Payer: Self-pay | Admitting: Cardiology

## 2014-01-04 VITALS — BP 120/80 | HR 61 | Ht 63.0 in | Wt 182.2 lb

## 2014-01-04 DIAGNOSIS — R0609 Other forms of dyspnea: Secondary | ICD-10-CM

## 2014-01-04 DIAGNOSIS — I4589 Other specified conduction disorders: Secondary | ICD-10-CM

## 2014-01-04 DIAGNOSIS — I480 Paroxysmal atrial fibrillation: Secondary | ICD-10-CM

## 2014-01-04 DIAGNOSIS — I471 Supraventricular tachycardia: Secondary | ICD-10-CM

## 2014-01-04 MED ORDER — DILTIAZEM HCL ER 60 MG PO CP12
60.0000 mg | ORAL_CAPSULE | Freq: Two times a day (BID) | ORAL | Status: DC
Start: 2014-01-04 — End: 2014-03-01

## 2014-01-04 MED ORDER — FLECAINIDE ACETATE 50 MG PO TABS: 50.0000 mg | ORAL_TABLET | Freq: Two times a day (BID) | ORAL | Status: DC

## 2014-01-04 NOTE — Patient Instructions (Addendum)
Will switch to  Diltiazem 60 mg twice a day For the first week  , please take diltiazem in the morning and acebutolol at night After the first week go to diltiazem 60 mg twice a day and stop acebutolol  Your physician wants you to follow-up in 3-4 month DR harding. 30 min appt.  You will receive a reminder letter in the mail two months in advance. If you don't receive a letter, please call our office to schedule the follow-up appointment.

## 2014-01-04 NOTE — Progress Notes (Signed)
PCP: Lucretia Kern., DO  Clinic Note: Chief Complaint  Patient presents with  . Follow-up    pt denies chest pain. pt states that she does experience sob and her left hand goes numb sometimes   HPI: Veronica Valencia is a 58 y.o. female with a PMH below who presents today for six-month follow-up of PAF and PSVT. She has been converted to acebutolol plus flecainide.   Past Medical History  Diagnosis Date  . Hot flashes   . Episcleritis of right eye   . Paroxysmal SVT (supraventricular tachycardia) 10/06/2012    This may have simply been misdiagnosed A. fib  . PAF (paroxysmal atrial fibrillation) July 2014  . Paroxysmal atrial flutter August 2014  . H/O echocardiogram OS 20/40    EF 60-65% with mild concentric LVH. Normal wall motion. Grade 1 diastolic dysfunction. Trace aortic regurgitation. No clear-cut evidence of murmur source  . Abnormal exercise tolerance test 11/24/2012    Did not reach 85% maximum heart rate. No ischemic changes noted at 79%. Notably beta blocker was not held; no arrhythmias noted.   Interval History: She presents today really without any major complaints, not having any recurrent rapid/irregular heartbeat episodes. She does note however being somewhat fatigued and tired. But the biggest thing is that although she can walk about 5 miles in the course today, as soon as she try to walk up a hill or up a flight of steps she gets profoundly dyspneic. She denies any chest tightness or pressure associated with the dyspnea. No PND, orthopnea or edema.  Only rare applications, but no rapid heart rates like she had before.   No weakness, syncope/near syncope, or TIA/amaurosis fugax symptoms.  A comprehensive ROS was performed. Review of Systems  Constitutional: Negative for weight loss and malaise/fatigue.  HENT: Negative for nosebleeds.   Respiratory: Negative for cough and wheezing.        Exertional dyspnea  Cardiovascular: Negative for claudication.    Gastrointestinal: Negative for blood in stool and melena.  Genitourinary: Negative for hematuria.  Psychiatric/Behavioral: Negative for depression. The patient is not nervous/anxious.   All other systems reviewed and are negative.   Current Outpatient Prescriptions on File Prior to Visit  Medication Sig Dispense Refill  . aspirin EC 81 MG tablet Take 81 mg by mouth daily.    . norethindrone-ethinyl estradiol (JINTELI) 1-5 MG-MCG TABS Take 1 tablet by mouth every morning. 84 tablet 3   No current facility-administered medications on file prior to visit.   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  Wt Readings from Last 3 Encounters:  01/04/14 182 lb 3.2 oz (82.645 kg)  09/08/13 193 lb (87.544 kg)  06/15/13 183 lb 4.8 oz (83.144 kg)   PHYSICAL EXAM BP 120/80 mmHg  Pulse 61  Ht 5\' 3"  (1.6 m)  Wt 182 lb 3.2 oz (82.645 kg)  BMI 32.28 kg/m2 General appearance: alert, cooperative, appears stated age, no distress and mildly obese Neck: no adenopathy, no carotid bruit and no JVD HEENT: [place]/AT, EOMI, MMM, anicteric sclera Lungs: clear to auscultation bilaterally, normal percussion bilaterally and non-labored Heart: regular rate and rhythm, S1, S2 normal, no murmur, click, rub or gallop; nondisplaced PMI Abdomen: soft, non-tender; bowel sounds normal; no masses, no organomegaly; Extremities: extremities normal, atraumatic, no cyanosis, and no edema; Pulses: 2+  and symmetric;  Neurologic: Mental status: Alert, oriented, thought content appropriate; Cranial nerves: II-XII grossly intact   Adult ECG Report  Rate: 61 ;  Rhythm: normal sinus rhythm  Narrative Interpretation: normal EKG  Recent Labs:  No recent labs checked   ASSESSMENT / PLAN: Paroxysmal atrial fibrillation /flutter She has not had any more breakthrough since  adding flecainide to the beta blocker. With what sounds like probably chronotropic incompetence related exertional dyspnea, I would back off a little bit on the AV nodal blockade. Going to gradually transition from acebutolol over to diltiazem. CHA2DS2Vasc Score = 0, therefore no need for any regulation beyond aspirin.   Paroxysmal SVT (supraventricular tachycardia) Stable on current medications. We'll see how she does with switching to calcium channel blocker from the beta blocker.  Exertional dyspnea Probably related to chronotropic incompetence.she was unable to get her heart rate up during her exercise tolerance test while on beta blocker. I wonder if she simply is unable to increase her heart rate in order to meet demands of the increased exertion with walking up a hill or up a flight of steps.   Chronotropic incompetence Possibly medication related. It may be that the beta blockers more potent than we needed to be. Unfortunately we can't reduce the dose of acebutolol anymore. Intervertebral diltiazem to see if this gives her a little bit better rate response. She needs to be on any nodal agents while on flecainide which has kept her arrhythmias at [place].   Orders Placed This Encounter  Procedures  . EKG 12-Lead   Meds ordered this encounter  Medications  . flecainide (TAMBOCOR) 50 MG tablet    Sig: Take 1 tablet (50 mg total) by mouth 2 (two) times daily.    Dispense:  180 tablet    Refill:  3  . diltiazem (CARDIZEM SR) 60 MG 12 hr capsule    Sig: Take 1 capsule (60 mg total) by mouth 2 (two) times daily.    Dispense:  60 capsule    Refill:  1   Followup: 3-4 months   HARDING,DAVID W, M.D., M.S. Interventional Cardiologist   Pager # [phone]

## 2014-01-06 ENCOUNTER — Encounter: Payer: Self-pay | Admitting: Cardiology

## 2014-01-06 DIAGNOSIS — I4589 Other specified conduction disorders: Secondary | ICD-10-CM | POA: Insufficient documentation

## 2014-01-06 DIAGNOSIS — R0609 Other forms of dyspnea: Secondary | ICD-10-CM

## 2014-01-06 NOTE — Assessment & Plan Note (Signed)
Probably related to chronotropic incompetence.she was unable to get her heart rate up during her exercise tolerance test while on beta blocker. I wonder if she simply is unable to increase her heart rate in order to meet demands of the increased exertion with walking up a hill or up a flight of steps.

## 2014-01-06 NOTE — Assessment & Plan Note (Addendum)
She has not had any more breakthrough since adding flecainide to the beta blocker. With what sounds like probably chronotropic incompetence related exertional dyspnea, I would back off a little bit on the AV nodal blockade. Going to gradually transition from acebutolol over to diltiazem. CHA2DS2Vasc Score = 0, therefore no need for any regulation beyond aspirin.

## 2014-01-06 NOTE — Assessment & Plan Note (Signed)
Possibly medication related. It may be that the beta blockers more potent than we needed to be. Unfortunately we can't reduce the dose of acebutolol anymore. Intervertebral diltiazem to see if this gives her a little bit better rate response. She needs to be on any nodal agents while on flecainide which has kept her arrhythmias at [place].

## 2014-01-06 NOTE — Assessment & Plan Note (Signed)
Stable on current medications. We'll see how she does with switching to calcium channel blocker from the beta blocker.

## 2014-03-01 ENCOUNTER — Other Ambulatory Visit: Payer: Self-pay | Admitting: Cardiology

## 2014-03-01 NOTE — Telephone Encounter (Signed)
Rx refill sent to patient pharmacy   

## 2014-03-12 ENCOUNTER — Other Ambulatory Visit: Payer: Self-pay | Admitting: Family Medicine

## 2014-03-15 ENCOUNTER — Encounter: Payer: Self-pay | Admitting: Family Medicine

## 2014-03-15 ENCOUNTER — Ambulatory Visit (INDEPENDENT_AMBULATORY_CARE_PROVIDER_SITE_OTHER): Payer: BLUE CROSS/BLUE SHIELD | Admitting: Family Medicine

## 2014-03-15 VITALS — BP 124/90 | HR 86 | Temp 97.7°F | Ht 63.0 in | Wt 179.1 lb

## 2014-03-15 DIAGNOSIS — J069 Acute upper respiratory infection, unspecified: Secondary | ICD-10-CM

## 2014-03-15 DIAGNOSIS — H109 Unspecified conjunctivitis: Secondary | ICD-10-CM

## 2014-03-15 MED ORDER — AMOXICILLIN 875 MG PO TABS: 875.0000 mg | ORAL_TABLET | Freq: Two times a day (BID) | ORAL | Status: DC

## 2014-03-15 MED ORDER — SULFACETAMIDE SODIUM 10 % OP SOLN: 1.0000 [drp] | OPHTHALMIC | Status: DC

## 2014-03-15 NOTE — Patient Instructions (Addendum)
INSTRUCTIONS FOR UPPER RESPIRATORY INFECTION:  -plenty of rest and fluids  -compresses for eyes  -antibiotics if worsening or not improving  -nasal saline wash 2-3 times daily (use prepackaged nasal saline or bottled/distilled water if making your own)   -can use AFRIN nasal spray for drainage and nasal congestion - but do NOT use longer then 3-4 days  -can use tylenol or ibuprofen as directed for aches and sorethroat  -in the winter time, using a humidifier at night is helpful (please follow cleaning instructions)  -if you are taking a cough medication - use only as directed, may also try a teaspoon of honey to coat the throat and throat lozenges  -for sore throat, salt water gargles can help  -follow up if you have fevers, facial pain, tooth pain, difficulty breathing or are worsening or not getting better in 5-7 days  -schedule mammogram  -schedule physical

## 2014-03-15 NOTE — Progress Notes (Signed)
Pre visit review using our clinic review tool, if applicable. No additional management support is needed unless otherwise documented below in the visit note. 

## 2014-03-15 NOTE — Progress Notes (Signed)
HPI:  URI: -started: about 12 days ago -symptoms:nasal congestion, sore throat, cough, a little sinus pain initially, eyes irritated, no vision loss, no sig eye pain, drainage for last few days -denies:fever, SOB, NVD, tooth pain, body aches -has tried: dayquil and nyquio -sick contacts/travel/risks: denies flu exposure, tick exposure or or Ebola risks  ROS: See pertinent positives and negatives per HPI.  Past Medical History  Diagnosis Date  . Hot flashes   . Episcleritis of right eye   . Paroxysmal SVT (supraventricular tachycardia) 10/06/2012    This may have simply been misdiagnosed A. fib  . PAF (paroxysmal atrial fibrillation) July 2014  . Paroxysmal atrial flutter August 2014  . H/O echocardiogram OS 20/40    EF 60-65% with mild concentric LVH. Normal wall motion. Grade 1 diastolic dysfunction. Trace aortic regurgitation. No clear-cut evidence of murmur source  . Abnormal exercise tolerance test 11/24/2012    Did not reach 85% maximum heart rate. No ischemic changes noted at 79%. Notably beta blocker was not held; no arrhythmias noted.    Past Surgical History  Procedure Laterality Date  . Varicose vein surgery  1990  . Cesarean section      Family History  Problem Relation Age of Onset  . Diabetes Mother   . Hypertension Mother   . Stroke Father   . Hypertension Father   . Cancer Sister 62    breast  . Heart attack Father 68    History   Social History  . Marital Status: Married    Spouse Name: N/A    Number of Children: N/A  . Years of Education: N/A   Social History Main Topics  . Smoking status: Never Smoker   . Smokeless tobacco: Never Used  . Alcohol Use: Yes     Comment: couple of glasses of wine daily   . Drug Use: No  . Sexual Activity: None   Other Topics Concern  . 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    Current outpatient prescriptions: aspirin EC 81 MG tablet, Take 81 mg by mouth daily., Disp: , Rfl: ;  diltiazem (CARDIZEM SR) 60 MG 12 hr capsule, TAKE ONE CAPSULE BY MOUTH TWICE A DAY, Disp: 60 capsule, Rfl: 5;  flecainide (TAMBOCOR) 50 MG tablet, Take 1 tablet (50 mg total) by mouth 2 (two) times daily., Disp: 180 tablet, Rfl: 3;  JINTELI 1-5 MG-MCG TABS, TAKE 1 TABLET BY MOUTH EVERY MORNING., Disp: 84 tablet, Rfl: 3 amoxicillin (AMOXIL) 875 MG tablet, Take 1 tablet (875 mg total) by mouth 2 (two) times daily., Disp: 20 tablet, Rfl: 0;  sulfacetamide (BLEPH-10) 10 % ophthalmic solution, Place 1 drop into both eyes every 3 (three) hours while awake., Disp: 15 mL, Rfl: 0  EXAM:  Filed Vitals:   03/15/14 1129  BP: 124/90  Pulse: 86  Temp: 97.7 F (36.5 C)    Body mass index is 31.73 kg/(m^2).  GENERAL: vitals reviewed and listed above, alert, oriented, appears well hydrated and in no acute distress  HEENT: atraumatic, conjunttiva mildy erythematous, clear drainage, PERRLA, visual acuity grossly intact, no obvious abnormalities on inspection of external nose and ears, normal appearance of ear canals and TMs, thick nasal  congestion, mild post oropharyngeal erythema with PND, no tonsillar edema or exudate, no sinus TTP  NECK: no obvious masses on inspection  LUNGS: clear to auscultation bilaterally, no wheezes, rales or rhonchi, good air movement  CV: HRRR, no peripheral edema  MS: moves all extremities without noticeable abnormality  PSYCH: pleasant and cooperative, no obvious depression or anxiety  ASSESSMENT AND PLAN:  Discussed the following assessment and plan:  Bilateral conjunctivitis - Plan: sulfacetamide (BLEPH-10) 10 % ophthalmic solution  Acute upper respiratory infection - Plan: amoxicillin (AMOXIL) 875 MG tablet  -given HPI and exam findings today, a  serious infection or illness is unlikely. We discussed potential etiologies, with VURI being most likely, and advised supportive care and monitoring. We discussed treatment side effects, likely course, antibiotic misuse, transmission, and signs of developing a serious illness. -given exam findings development of sinusitis possible an dif any signs of this or symptoms persist she has abx to take -conjunctivitis likely viral and advised tx with compresses, drops only if not improving -of course, we advised to return or notify a doctor immediately if symptoms worsen or persist or new concerns arise.    Patient Instructions  INSTRUCTIONS FOR UPPER RESPIRATORY INFECTION:  -plenty of rest and fluids  -compresses for eyes  -antibiotics if worsening or not improving  -nasal saline wash 2-3 times daily (use prepackaged nasal saline or bottled/distilled water if making your own)   -can use AFRIN nasal spray for drainage and nasal congestion - but do NOT use longer then 3-4 days  -can use tylenol or ibuprofen as directed for aches and sorethroat  -in the winter time, using a humidifier at night is helpful (please follow cleaning instructions)  -if you are taking a cough medication - use only as directed, may also try a teaspoon of honey to coat the throat and throat lozenges  -for sore throat, salt water gargles can help  -follow up if you have fevers, facial pain, tooth pain, difficulty breathing or are worsening or not getting better in 5-7 days  -schedule mammogram  -schedule physical      KIM, HANNAH R.

## 2014-03-17 ENCOUNTER — Other Ambulatory Visit: Payer: Self-pay | Admitting: Family Medicine

## 2014-04-14 ENCOUNTER — Telehealth: Payer: Self-pay | Admitting: Family Medicine

## 2014-04-14 NOTE — Telephone Encounter (Signed)
Noted. Appt scheduled. 

## 2014-04-14 NOTE — Telephone Encounter (Signed)
[facility] - Client [facility] Patient Name: Veronica Valencia DOB: 1955-10-10 Initial Comment Caller states she has had respiratory issues and has been taking a antibiotic. IT is not working and now she is struggling breathing and having back pain. Cough has made it hard to breathe. Nurse Assessment Nurse: Erlene Quan, RN, Manuela Schwartz Date/Time Eilene Ghazi Time): 04/14/2014 10:38:17 AM Confirm and document reason for call. If symptomatic, describe symptoms. ---Caller states she has had respiratory issues and has been taking a antibiotic that she finished Feb 1st - It did not work and now she is struggling breathing and having back pain due to cough - Coughing spells has made it hard to breathe - caller then states she is not struggling to breath but coughing has gotten worse and not really any better since antibiotic - no fever - she is still working Has the patient traveled out of the country within the last 30 days? ---Not Applicable Does the patient require triage? ---Yes Related visit to physician within the last 2 weeks? ---No Does the PT have any chronic conditions? (i.e. diabetes, asthma, etc.) ---No Guidelines Guideline Title Affirmed Question Affirmed Notes Cough - Acute Productive SEVERE coughing spells (e.g., whooping sound after coughing, vomiting after coughing) Final Disposition User See Physician within [place], RN, Manuela Schwartz Comments Appointment 04/15/2014 1230 pm with Megan Salon

## 2014-04-15 ENCOUNTER — Ambulatory Visit (INDEPENDENT_AMBULATORY_CARE_PROVIDER_SITE_OTHER): Payer: BLUE CROSS/BLUE SHIELD | Admitting: Family

## 2014-04-15 ENCOUNTER — Encounter: Payer: Self-pay | Admitting: Family

## 2014-04-15 VITALS — BP 128/70 | HR 87 | Temp 98.7°F

## 2014-04-15 DIAGNOSIS — M940 Chondrocostal junction syndrome [Tietze]: Secondary | ICD-10-CM

## 2014-04-15 DIAGNOSIS — R059 Cough, unspecified: Secondary | ICD-10-CM

## 2014-04-15 DIAGNOSIS — R05 Cough: Secondary | ICD-10-CM

## 2014-04-15 MED ORDER — GUAIFENESIN-CODEINE 100-10 MG/5ML PO SYRP: 5.0000 mL | ORAL_SOLUTION | Freq: Three times a day (TID) | ORAL | Status: DC | PRN

## 2014-04-15 MED ORDER — METHYLPREDNISOLONE 4 MG PO KIT: PACK | ORAL | Status: AC

## 2014-04-15 NOTE — Progress Notes (Signed)
Pre visit review using our clinic review tool, if applicable. No additional management support is needed unless otherwise documented below in the visit note. 

## 2014-04-15 NOTE — Progress Notes (Signed)
Subjective:    Patient ID: Veronica Valencia, female    DOB: 05-26-1955, 59 y.o.   MRN: [id number]  HPI 59 year old white female, nonsmoker is in today with complaints of persistent cough that originally began 03/05/2014. Was seen January 11 by Dr. Maudie Mercury and diagnosed with an upper respiratory infection. She took amoxicillin 2 weeks later and her symptoms did not get much better. Reports having a deep cough that is painful. Pain is worse with movement. Clear phlegm. Has been using cough drops. No other over-the-counter measures.   Review of Systems  Constitutional: Negative.   HENT: Positive for postnasal drip, rhinorrhea and sore throat.   Respiratory: Positive for cough. Negative for shortness of breath and wheezing.        Chest wall pain  Gastrointestinal: Negative.   Endocrine: Negative.   Genitourinary: Negative.   Musculoskeletal: Negative.   Skin: Negative.   Allergic/Immunologic: Negative.   Neurological: Negative.   Psychiatric/Behavioral: Negative.    Past Medical History  Diagnosis Date  . Hot flashes   . Episcleritis of right eye   . Paroxysmal SVT (supraventricular tachycardia) 10/06/2012    This may have simply been misdiagnosed A. fib  . PAF (paroxysmal atrial fibrillation) July 2014  . Paroxysmal atrial flutter August 2014  . H/O echocardiogram OS 20/40    EF 60-65% with mild concentric LVH. Normal wall motion. Grade 1 diastolic dysfunction. Trace aortic regurgitation. No clear-cut evidence of murmur source  . Abnormal exercise tolerance test 11/24/2012    Did not reach 85% maximum heart rate. No ischemic changes noted at 79%. Notably beta blocker was not held; no arrhythmias noted.    History   Social History  . Marital Status: Married    Spouse Name: N/A  . Number of Children: N/A  . Years of Education: N/A   Occupational History  . Not on file.   Social History Main Topics  . Smoking status: Never Smoker   . Smokeless tobacco: Never Used  . Alcohol Use:  Yes     Comment: couple of glasses of wine daily   . Drug Use: No  . Sexual Activity: Not on file   Other Topics Concern  . Not on file   Social History Narrative   She is a married mother of 3.    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    Past Surgical History  Procedure Laterality Date  . Varicose vein surgery  1990  . Cesarean section      Family History  Problem Relation Age of Onset  . Diabetes Mother   . Hypertension Mother   . Stroke Father   . Hypertension Father   . Cancer Sister 58    breast  . Heart attack Father 58    No Known Allergies  Current Outpatient Prescriptions on File Prior to Visit  Medication Sig Dispense Refill  . aspirin EC 81 MG tablet Take 81 mg by mouth daily.    Marland Kitchen diltiazem (CARDIZEM SR) 60 MG 12 hr capsule TAKE ONE CAPSULE BY MOUTH TWICE A DAY 60 capsule 5  . flecainide (TAMBOCOR) 50 MG tablet Take 1 tablet (50 mg total) by mouth 2 (two) times daily. 180 tablet 3  . JINTELI 1-5 MG-MCG TABS TAKE 1 TABLET BY MOUTH EVERY MORNING. 84 tablet 3  No current facility-administered medications on file prior to visit.    BP 128/70 mmHg  Pulse 87  Temp(Src) 98.7 F (37.1 C) (Oral)chart    Objective:   Physical Exam  Constitutional: She appears well-developed and well-nourished.  HENT:  Right Ear: External ear normal.  Left Ear: External ear normal.  Mouth/Throat: Oropharynx is clear and moist.  Neck: Normal range of motion. Neck supple.  Cardiovascular: Normal rate, regular rhythm and normal heart sounds.   Pulmonary/Chest: Effort normal.  Musculoskeletal: Normal range of motion.  Neurological: She is alert.  Skin: Skin is warm and dry.  Psychiatric: She has a normal mood and affect.          Assessment & Plan:  Diagnoses and all orders for this  visit:  Costalchondritis  Cough  Other orders -     methylPREDNISolone (MEDROL DOSEPAK) 4 MG tablet; follow package directions -     guaiFENesin-codeine (CHERATUSSIN AC) 100-10 MG/5ML syrup; Take 5 mLs by mouth 3 (three) times daily as needed.   Call the office with any questions or concerns. Recheck as scheduled and sooner as needed.

## 2014-04-15 NOTE — Patient Instructions (Signed)
Costochondritis Costochondritis, sometimes called Tietze syndrome, is a swelling and irritation (inflammation) of the tissue (cartilage) that connects your ribs with your breastbone (sternum). It causes pain in the chest and rib area. Costochondritis usually goes away on its own over time. It can take up to 6 weeks or longer to get better, especially if you are unable to limit your activities. CAUSES  Some cases of costochondritis have no known cause. Possible causes include:  Injury (trauma).  Exercise or activity such as lifting.  Severe coughing. SIGNS AND SYMPTOMS  Pain and tenderness in the chest and rib area.  Pain that gets worse when coughing or taking deep breaths.  Pain that gets worse with specific movements. DIAGNOSIS  Your health care provider will do a physical exam and ask about your symptoms. Chest X-rays or other tests may be done to rule out other problems. TREATMENT  Costochondritis usually goes away on its own over time. Your health care provider may prescribe medicine to help relieve pain. HOME CARE INSTRUCTIONS   Avoid exhausting physical activity. Try not to strain your ribs during normal activity. This would include any activities using chest, abdominal, and side muscles, especially if heavy weights are used.  Apply ice to the affected area for the first 2 days after the pain begins.  Put ice in a plastic bag.  Place a towel between your skin and the bag.  Leave the ice on for 20 minutes, 2-3 times a day.  Only take over-the-counter or prescription medicines as directed by your health care provider. SEEK MEDICAL CARE IF:  You have redness or swelling at the rib joints. These are signs of infection.  Your pain does not go away despite rest or medicine. SEEK IMMEDIATE MEDICAL CARE IF:   Your pain increases or you are very uncomfortable.  You have shortness of breath or difficulty breathing.  You cough up blood.  You have worse chest pains,  sweating, or vomiting.  You have a fever or persistent symptoms for more than 2-3 days.  You have a fever and your symptoms suddenly get worse. MAKE SURE YOU:   Understand these instructions.  Will watch your condition.  Will get help right away if you are not doing well or get worse. Document Released: 11/29/2004 Document Revised: 12/10/2012 Document Reviewed: 09/23/2012 ExitCare Patient Information 2015 [employer]. This information is not intended to replace advice given to you by your health care provider. Make sure you discuss any questions you have with your health care provider.  

## 2014-05-07 ENCOUNTER — Ambulatory Visit (INDEPENDENT_AMBULATORY_CARE_PROVIDER_SITE_OTHER): Payer: BLUE CROSS/BLUE SHIELD | Admitting: Family Medicine

## 2014-05-07 ENCOUNTER — Encounter: Payer: Self-pay | Admitting: Family Medicine

## 2014-05-07 VITALS — BP 130/86 | HR 60 | Temp 98.3°F | Ht 62.25 in | Wt 183.0 lb

## 2014-05-07 DIAGNOSIS — N951 Menopausal and female climacteric states: Secondary | ICD-10-CM

## 2014-05-07 DIAGNOSIS — I480 Paroxysmal atrial fibrillation: Secondary | ICD-10-CM

## 2014-05-07 DIAGNOSIS — I471 Supraventricular tachycardia: Secondary | ICD-10-CM

## 2014-05-07 DIAGNOSIS — Z0000 Encounter for general adult medical examination without abnormal findings: Secondary | ICD-10-CM

## 2014-05-07 DIAGNOSIS — R232 Flushing: Secondary | ICD-10-CM

## 2014-05-07 LAB — LIPID PANEL
CHOLESTEROL: 181 mg/dL (ref 0–200)
HDL: 64.3 mg/dL (ref >39.00)
LDL CALC: 102 mg/dL — AB (ref 0–99)
NonHDL: 116.7
Total CHOL/HDL Ratio: 3
Triglycerides: 72 mg/dL (ref 0.0–149.0)
VLDL: 14.4 mg/dL (ref 0.0–40.0)

## 2014-05-07 LAB — BASIC METABOLIC PANEL
BUN: 14 mg/dL (ref 6–23)
CHLORIDE: 102 meq/L (ref 96–112)
CO2: 29 mEq/L (ref 19–32)
Calcium: 9.6 mg/dL (ref 8.4–10.5)
Creatinine, Ser: 0.71 mg/dL (ref 0.40–1.20)
GFR: 90.11 mL/min (ref >60.00)
GLUCOSE: 110 mg/dL — AB (ref 70–99)
POTASSIUM: 4.9 meq/L (ref 3.5–5.1)
SODIUM: 137 meq/L (ref 135–145)

## 2014-05-07 LAB — HEMOGLOBIN A1C: Hgb A1c MFr Bld: 5.6 % (ref 4.6–6.5)

## 2014-05-07 NOTE — Progress Notes (Signed)
HPI:  Here for CPE:  -Concerns and/or follow up today:  Hot flashes/menopausal symptoms: -had wanted to taper off her hormones in the past -started hormone therapy in March 2012 with previous PCP -reports when misses a dose hot flashes are intolerable and decided she wants to continue this and understands risks -denies: CP, SOB, HA with aura  PSVT, PAF, -managed by Dr. Ellyn Hack, he cardiologist -recent notes reviewed -meds: asa, dilt 60 bid, flecainide 50  -Diet: variety of foods, balance and well rounded   -Exercise: back to regular exercise - 4 miles this morning, but had been dealing with mother in law passing and had not been as good  -Taking folic acid, vitamin D or calcium:  no  -Diabetes and Dyslipidemia Screening: FASTING today  -Vaccines: influenza  -pap history: 10/2012 - normal  -FDLMP: n/a  -sexual activity: yes, female partner, no new partners  -wants STI testing: no  -FH breast, colon or ovarian ca: see FH Last mammogram: 04/2013 Last colon cancer screening: don  -Alcohol, Tobacco, drug use: see social history  Review of Systems - no fevers, unintentional weight loss, vision loss, hearing loss, chest pain, sob, hemoptysis, melena, hematochezia, hematuria, genital discharge, changing or concerning skin lesions, bleeding, bruising, loc, thoughts of self harm or SI  Past Medical History  Diagnosis Date  . Hot flashes   . Episcleritis of right eye   . Paroxysmal SVT (supraventricular tachycardia) 10/06/2012    This may have simply been misdiagnosed A. fib  . PAF (paroxysmal atrial fibrillation) July 2014  . Paroxysmal atrial flutter August 2014  . H/O echocardiogram OS 20/40    EF 60-65% with mild concentric LVH. Normal wall motion. Grade 1 diastolic dysfunction. Trace aortic regurgitation. No clear-cut evidence of murmur source  . Abnormal exercise tolerance test 11/24/2012    Did not reach 85% maximum heart rate. No ischemic changes noted at 79%. Notably  beta blocker was not held; no arrhythmias noted.    Past Surgical History  Procedure Laterality Date  . Varicose vein surgery  1990  . Cesarean section      Family History  Problem Relation Age of Onset  . Diabetes Mother   . Hypertension Mother   . Stroke Father   . Hypertension Father   . Cancer Sister 48    breast  . Heart attack Father 75    History   Social History  . Marital Status: Married    Spouse Name: N/A  . Number of Children: N/A  . Years of Education: N/A   Social History Main Topics  . Smoking status: Never Smoker   . Smokeless tobacco: Never Used  . Alcohol Use: Yes     Comment: couple of glasses of wine daily   . Drug Use: No  . Sexual Activity: Not on file   Other Topics Concern  . 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     Current outpatient prescriptions:  .  aspirin EC 81 MG tablet, Take 81 mg by mouth daily., Disp: , Rfl:  .  diltiazem (CARDIZEM SR) 60 MG 12 hr capsule, TAKE ONE CAPSULE BY MOUTH TWICE A DAY, Disp: 60 capsule, Rfl: 5 .  flecainide (TAMBOCOR) 50  MG tablet, Take 1 tablet (50 mg total) by mouth 2 (two) times daily., Disp: 180 tablet, Rfl: 3 .  JINTELI 1-5 MG-MCG TABS, TAKE 1 TABLET BY MOUTH EVERY MORNING., Disp: 84 tablet, Rfl: 3  EXAM:  Filed Vitals:   05/07/14 0943  BP: 130/86  Pulse: 60  Temp: 98.3 F (36.8 C)    GENERAL: vitals reviewed and listed below, alert, oriented, appears well hydrated and in no acute distress  HEENT: head atraumatic, PERRLA, normal appearance of eyes, ears, nose and mouth. moist mucus membranes.  NECK: supple, no masses or lymphadenopathy  LUNGS: clear to auscultation bilaterally, no rales, rhonchi or wheeze  CV: HRRR, no peripheral edema or  cyanosis, normal pedal pulses  BREAST: declined  ABDOMEN: bowel sounds normal, soft, non tender to palpation, no masses, no rebound or guarding  GU: declined  RECTAL: refused  SKIN: no rash or abnormal lesions  MS: normal gait, moves all extremities normally  NEURO: CN II-XII grossly intact, normal muscle strength and sensation to light touch on extremities  PSYCH: normal affect, pleasant and cooperative  ASSESSMENT AND PLAN:  Discussed the following assessment and plan:  Visit for preventive health examination - Plan: Basic metabolic panel, Lipid Panel, Hemoglobin A1c  Hot flashes  Paroxysmal atrial fibrillation /flutter  Paroxysmal SVT (supraventricular tachycardia)  -Discussed and advised all [place] preventive services health task force level A and B recommendations for age, sex and risks.  -Advised at least 150 minutes of exercise per week and a healthy diet low in saturated fats and sweets and consisting of fresh fruits and vegetables, lean meats such as fish and white chicken and whole grains.  -FASTING labs, studies and vaccines per orders this encounter  Orders Placed This Encounter  Procedures  . Basic metabolic panel  . Lipid Panel  . Hemoglobin A1c    Patient advised to return to clinic immediately if symptoms worsen or persist or new concerns.  Patient Instructions  Vit D3 [phone] IU daily  Adequate dietary intake of calcium (1200mg )  We recommend the following healthy lifestyle measures: - eat a healthy diet consisting of lots of vegetables, fruits, beans, nuts, seeds, healthy meats such as white chicken and fish and whole grains.  - avoid fried foods, fast food, processed foods, sodas, red meet and other fattening foods.  - get a least 150 minutes of aerobic exercise per week.    Please go to [email or web address] for more information on genetic testing for breast cancer    No Follow-up on file.  Colin Benton R.

## 2014-05-07 NOTE — Progress Notes (Signed)
Pre visit review using our clinic review tool, if applicable. No additional management support is needed unless otherwise documented below in the visit note. 

## 2014-05-07 NOTE — Patient Instructions (Addendum)
Vit D3 [phone] IU daily  Adequate dietary intake of calcium (1200mg )  We recommend the following healthy lifestyle measures: - eat a healthy diet consisting of lots of vegetables, fruits, beans, nuts, seeds, healthy meats such as white chicken and fish and whole grains.  - avoid fried foods, fast food, processed foods, sodas, red meet and other fattening foods.  - get a least 150 minutes of aerobic exercise per week.    Please go to [email or web address] for more information on genetic testing for breast cancer

## 2014-05-20 ENCOUNTER — Ambulatory Visit (INDEPENDENT_AMBULATORY_CARE_PROVIDER_SITE_OTHER): Payer: BLUE CROSS/BLUE SHIELD | Admitting: Cardiology

## 2014-05-20 VITALS — BP 116/82 | HR 72 | Ht 63.0 in | Wt 186.0 lb

## 2014-05-20 DIAGNOSIS — R0609 Other forms of dyspnea: Secondary | ICD-10-CM

## 2014-05-20 DIAGNOSIS — I358 Other nonrheumatic aortic valve disorders: Secondary | ICD-10-CM

## 2014-05-20 DIAGNOSIS — I480 Paroxysmal atrial fibrillation: Secondary | ICD-10-CM

## 2014-05-20 DIAGNOSIS — I4589 Other specified conduction disorders: Secondary | ICD-10-CM

## 2014-05-20 DIAGNOSIS — I471 Supraventricular tachycardia: Secondary | ICD-10-CM

## 2014-05-20 MED ORDER — DILTIAZEM HCL ER 60 MG PO CP12: 60.0000 mg | ORAL_CAPSULE | Freq: Two times a day (BID) | ORAL | Status: DC

## 2014-05-20 NOTE — Patient Instructions (Addendum)
MAY TAKE AN EXTRA DOSE OF DILTIAZEM OR FLECAINIDE IF EXTRA RUN OF FAST HEARTBEAT.    Your physician wants you to follow-up in July 2016 Dr HARDING 30 MIN AP POINTMENT.  You will receive a reminder letter in the mail two months in advance. If you don't receive a letter, please call our office to schedule the follow-up appointment.

## 2014-05-22 ENCOUNTER — Encounter: Payer: Self-pay | Admitting: Cardiology

## 2014-05-22 NOTE — Assessment & Plan Note (Signed)
Notably improved with changing from beta blocker to low-dose diltiazem.

## 2014-05-22 NOTE — Assessment & Plan Note (Signed)
As I  Was concerned about,this is probably related to her beta blocker. Therefore we will continue with calcitonin blockers and avoid beta blockers.

## 2014-05-22 NOTE — Progress Notes (Signed)
PCP: Lucretia Kern., DO  Clinic Note: Chief Complaint  Patient presents with  . Follow-up    4 months, pt stated she's been well, denied chest pain and SOB  . Atrial Fibrillation    And SVT    HPI: Veronica Valencia is a 59 y.o. female with a PMH below who presents today for roughly 6 month followup of her various arrhythmias.  She has a history of PSVT as well as paroxysmal atrial fibrillation. When I saw her in November, she was really noticing that she is feeling fatigue and exertional dyspnea. We switched her from beta blocker to calcium channel blocker at a lower dose.  Interval History: Since making this adjustment, she is felt much better. She is back exercising, and fixated on going on her family vacation to [place] where she will be hoping to climb [employer].  She has minimal if any palpitations, and has had much better stamina. She will occasional 2-3 second bursts but nothing last more than a minute. She also has got pretty good about using her vagal maneuvers. She's not had any resting or exertional chest tightness pressure or dyspnea. No heart failure symptoms of PND, orthopnea or edema. No syncope or near syncope, TIA or amaurosis fugax.  Past Medical History  Diagnosis Date  . Hot flashes   . Episcleritis of right eye   . Paroxysmal SVT (supraventricular tachycardia) 10/06/2012    This may have simply been misdiagnosed A. fib  . PAF (paroxysmal atrial fibrillation) July 2014  . Paroxysmal atrial flutter August 2014  . H/O echocardiogram OS 20/40    EF 60-65% with mild concentric LVH. Normal wall motion. Grade 1 diastolic dysfunction. Trace aortic regurgitation. No clear-cut evidence of murmur source  . Abnormal exercise tolerance test 11/24/2012    Did not reach 85% maximum heart rate. No ischemic changes noted at 79%. Notably beta blocker was not held; no arrhythmias noted.    Prior Cardiac Evaluation and Past Surgical History: Past Surgical History  Procedure Laterality  Date  . Varicose vein surgery  1990  . Cesarean section     ROS: A comprehensive was performed. Review of Systems  Constitutional: Negative for malaise/fatigue (Much better stamina and energy after stopping beta blocker).  HENT: Negative for nosebleeds.   Respiratory: Negative.  Negative for shortness of breath (No longer noting exertional dyspnea).   Cardiovascular: Positive for palpitations (Notably improved. Has minimal symptoms). Negative for claudication.  Gastrointestinal: Negative.  Negative for blood in stool and melena.  Genitourinary: Negative for hematuria.  Musculoskeletal: Positive for joint pain (Mild arthralgias).  Neurological: Negative for dizziness and headaches.  Endo/Heme/Allergies: Does not bruise/bleed easily.  Psychiatric/Behavioral: Negative.     Current Outpatient Prescriptions on File Prior to Visit  Medication Sig Dispense Refill  . aspirin EC 81 MG tablet Take 81 mg by mouth daily.    . flecainide (TAMBOCOR) 50 MG tablet Take 1 tablet (50 mg total) by mouth 2 (two) times daily. 180 tablet 3  . JINTELI 1-5 MG-MCG TABS TAKE 1 TABLET BY MOUTH EVERY MORNING. 84 tablet 3   No current facility-administered medications on file prior to visit.   No Known Allergies  History  Substance Use Topics  . Smoking status: Never Smoker   . Smokeless tobacco: Never Used  . Alcohol Use: Yes     Comment: couple of glasses of wine daily    Family History  Problem Relation Age of Onset  . Diabetes Mother   . Hypertension  Mother   . Stroke Father   . Hypertension Father   . Cancer Sister 18    breast  . Heart attack Father 75    Wt Readings from Last 3 Encounters:  05/20/14 186 lb (84.369 kg)  05/07/14 183 lb (83.008 kg)  03/15/14 179 lb 1.6 oz (81.239 kg)    PHYSICAL EXAM BP 116/82 mmHg  Pulse 72  Ht 5\' 3"  (1.6 m)  Wt 186 lb (84.369 kg)  BMI 32.96 kg/m2 General appearance: alert, cooperative, appears stated age, no distress and mildly obese HEENT:  [place]/AT, EOMI, MMM, anicteric sclera Neck: no adenopathy, no carotid bruit and no JVD HEENT: [place]/AT, EOMI, MMM, anicteric sclera Lungs: CTAB, normal percussion bilaterally and non-labored Heart: RRR, S1, S2 normal, no murmur, click, rub or gallop; nondisplaced PMI Abdomen: soft, non-tender; bowel sounds normal; no masses, no organomegaly; Extremities: extremities normal, atraumatic, no cyanosis, and no edema; Pulses: 2+ and symmetric;  Neurologic: Mental status: Alert, oriented, thought content appropriate; Cranial nerves: II-XII grossly intact Psych: normal/pleasant mood and affect.   Adult ECG Report  Rate: 72 ;  Rhythm: normal sinus rhythm (computer read as possible left atrial enlargement is technically correct, but not likely)  Narrative Interpretation: stable EKG  Recent Labs:   Recently checked by PCP - reviewed in Epic.   Chemistry      Component Value Date/Time   NA 137 05/07/2014 1016   K 4.9 05/07/2014 1016   CL 102 05/07/2014 1016   CO2 29 05/07/2014 1016   BUN 14 05/07/2014 1016   CREATININE 0.71 05/07/2014 1016      Component Value Date/Time   CALCIUM 9.6 05/07/2014 1016     Lab Results  Component Value Date   CHOL 181 05/07/2014   HDL 64.30 05/07/2014   LDLCALC 102* 05/07/2014   TRIG 72.0 05/07/2014   CHOLHDL 3 05/07/2014   Essentially a goal for her  ASSESSMENT / PLAN: Problem List Items Addressed This Visit    Aortic valve sclerosis (Chronic)    Her soft murmur is probably related to sclerosis, but is still benign.      Relevant Medications   diltiazem (CARDIZEM SR) 12 hr capsule   Chronotropic incompetence with beta blockers (Chronic)    Notably improved with changing from beta blocker to low-dose diltiazem.      Relevant Orders   EKG 12-Lead (Completed)   Exertional dyspnea - with Beta Blockers (Chronic)    As I  Was concerned about,this is probably related to her beta blocker. Therefore we will continue with calcitonin blockers and avoid beta  blockers.      Paroxysmal atrial fibrillation /flutter (Chronic)    Doing very well with no obvious breakthrough episodes on flecainide. We changed her AV nodal agents seen low-dose diltiazem. If she were to have a breakthrough on this low dose, I told her she would take a when necessary dose of flecainide and diltiazem at the time of onset of symptoms and would probably need to use increased doses for a day or so.  So far she has not had any breakthrough episodes. She does not have enough risk factors to have a CHA2DS2VASC score high enough to require anticoagulation.      Relevant Medications   diltiazem (CARDIZEM SR) 12 hr capsule   Other Relevant Orders   EKG 12-Lead (Completed)   Paroxysmal SVT (supraventricular tachycardia) - Primary (Chronic)    She is on calcium channel blocker now relatively stable. Potentially assisted by flecainide.  We  talked about vagal maneuvers again, she has become quickly with these options.      Relevant Medications   diltiazem (CARDIZEM SR) 12 hr capsule   Other Relevant Orders   EKG 12-Lead (Completed)      Orders Placed This Encounter  Procedures  . EKG 12-Lead   Meds ordered this encounter  Medications  . diltiazem (CARDIZEM SR) 60 MG 12 hr capsule    Sig: Take 1 capsule (60 mg total) by mouth 2 (two) times daily.    Dispense:  180 capsule    Refill:  3    Followup: prior to her trip to the [place] in August   Ellyn Hack, Leonie Green, M.D., M.S. Interventional Cardiologist   Pager # [phone]

## 2014-05-22 NOTE — Assessment & Plan Note (Signed)
Doing very well with no obvious breakthrough episodes on flecainide. We changed her AV nodal agents seen low-dose diltiazem. If she were to have a breakthrough on this low dose, I told her she would take a when necessary dose of flecainide and diltiazem at the time of onset of symptoms and would probably need to use increased doses for a day or so.  So far she has not had any breakthrough episodes. She does not have enough risk factors to have a CHA2DS2VASC score high enough to require anticoagulation.

## 2014-05-22 NOTE — Assessment & Plan Note (Signed)
Her soft murmur is probably related to sclerosis, but is still benign.

## 2014-05-22 NOTE — Assessment & Plan Note (Signed)
She is on calcium channel blocker now relatively stable. Potentially assisted by flecainide.  We talked about vagal maneuvers again, she has become quickly with these options.

## 2014-07-05 ENCOUNTER — Other Ambulatory Visit: Payer: Self-pay

## 2014-07-05 DIAGNOSIS — Z1231 Encounter for screening mammogram for malignant neoplasm of breast: Secondary | ICD-10-CM

## 2014-07-28 ENCOUNTER — Ambulatory Visit
Admission: RE | Admit: 2014-07-28 | Discharge: 2014-07-28 | Disposition: A | Discharge status: Home / Self Care | Payer: BLUE CROSS/BLUE SHIELD | Source: Ambulatory Visit

## 2014-07-28 DIAGNOSIS — Z1231 Encounter for screening mammogram for malignant neoplasm of breast: Secondary | ICD-10-CM

## 2014-08-13 ENCOUNTER — Telehealth: Payer: Self-pay | Admitting: Family Medicine

## 2014-08-13 DIAGNOSIS — M25562 Pain in left knee: Secondary | ICD-10-CM

## 2014-08-13 NOTE — Telephone Encounter (Signed)
Patient informed and of the message below and I offered a few different dates for appts for the pt and none of these would work for her.  She requested Dr Maudie Mercury call her and the message was forwarded.

## 2014-08-13 NOTE — Telephone Encounter (Signed)
I think she saw Milagros Evener? If persistent issues, she may want to get back in with ortho or here for eval as would need prior to referral to see what is needed. Thanks.

## 2014-08-13 NOTE — Telephone Encounter (Signed)
Pt would like a referral for physical therapy for left knee pain. Pt fell almost a year ago. Pt was referred to ortho md last year

## 2014-08-16 NOTE — Telephone Encounter (Signed)
IT band syndrome L per pt which she reports is flaring up again and she wants referral to PT. Reports feels the same as in the past when evaluated by ortho and feels ortho did not really do much. Advised if not improving iwht PT over the next few weeks to come for eval.

## 2014-08-31 ENCOUNTER — Ambulatory Visit: Payer: BLUE CROSS/BLUE SHIELD | Attending: Family Medicine

## 2014-08-31 DIAGNOSIS — M6289 Other specified disorders of muscle: Secondary | ICD-10-CM | POA: Diagnosis not present

## 2014-08-31 DIAGNOSIS — M25551 Pain in right hip: Secondary | ICD-10-CM | POA: Diagnosis not present

## 2014-08-31 DIAGNOSIS — M25651 Stiffness of right hip, not elsewhere classified: Secondary | ICD-10-CM | POA: Insufficient documentation

## 2014-08-31 DIAGNOSIS — G8929 Other chronic pain: Secondary | ICD-10-CM | POA: Diagnosis not present

## 2014-08-31 DIAGNOSIS — M25561 Pain in right knee: Secondary | ICD-10-CM

## 2014-08-31 DIAGNOSIS — R29898 Other symptoms and signs involving the musculoskeletal system: Secondary | ICD-10-CM

## 2014-08-31 NOTE — Patient Instructions (Addendum)
IT Band: Knee Across Body   Lie on back, shoulders on surface. Stretch right knee up and across body. Hold __20_ seconds. Relax. Repeat 3  times. Do _2__ times a day. Repeat with other leg.   IT Band: Wall Lean   Stand with left hand on wall. Lean hip toward wall with other arm supporting trunk. Hold _20__ seconds. Relax. Repeat 3_ times. Do _3__ times a day. Repeat on other side.  IONTOPHORESIS PATIENT PRECAUTIONS & CONTRAINDICATIONS:  . Redness under one or both electrodes can occur.  This characterized by a uniform redness that usually disappears within 12 hours of treatment. . Small pinhead size blisters may result in response to the drug.  Contact your physician if the problem persists more than 24 hours. . On rare occasions, iontophoresis therapy can result in temporary skin reactions such as rash, inflammation, irritation or burns.  The skin reactions may be the result of individual sensitivity to the ionic solution used, the condition of the skin at the start of treatment, reaction to the materials in the electrodes, allergies or sensitivity to dexamethasone, or a poor connection between the patch and your skin.  Discontinue using iontophoresis if you have any of these reactions and report to your therapist. . Remove the Patch or electrodes if you have any undue sensation of pain or burning during the treatment and report discomfort to your therapist. . Tell your Therapist if you have had known adverse reactions to the application of electrical current. . If using the Patch, the LED light will turn off when treatment is complete and the patch can be removed.  Approximate treatment time is 1-3 hours.  Remove the patch when light goes off or after 6 hours. . The Patch can be worn during normal activity, however excessive motion where the electrodes have been placed can cause poor contact between the skin and the electrode or uneven electrical current resulting in greater risk of skin  irritation. Marland Kitchen Keep out of the reach of children.   . DO NOT use if you have a cardiac pacemaker or any other electrically sensitive implanted device. . DO NOT use if you have a known sensitivity to dexamethasone. . DO NOT use during Magnetic Resonance Imaging (MRI). . DO NOT use over broken or compromised skin (e.g. sunburn, cuts, or acne) due to the increased risk of skin reaction. . DO NOT SHAVE over the area to be treated:  To establish good contact between the Patch and the skin, excessive hair may be clipped. . DO NOT place the Patch or electrodes on or over your eyes, directly over your heart, or brain. . DO NOT reuse the Patch or electrodes as this may cause burns to occur. Marland Kitchen

## 2014-08-31 NOTE — Therapy (Signed)
[facility] [address], [place], [place], [address] Phone: [phone]   Fax:  [phone]  Physical Therapy Evaluation  Patient Details  Name: Veronica Valencia MRN: [id number] Date of Birth: 11-08-1955 Referring Provider:  Lucretia Kern, DO  Encounter Date: 08/31/2014      PT End of Session - 08/31/14 1117    Visit Number 1   Date for PT Re-Evaluation 10/26/14   PT Start Time 1022   PT Stop Time 1111   PT Time Calculation (min) 49 min   Activity Tolerance Patient tolerated treatment well   Behavior During Therapy [facility] for tasks assessed/performed      Past Medical History  Diagnosis Date  . Hot flashes   . Episcleritis of right eye   . Paroxysmal SVT (supraventricular tachycardia) 10/06/2012    This may have simply been misdiagnosed A. fib  . PAF (paroxysmal atrial fibrillation) July 2014  . Paroxysmal atrial flutter August 2014  . H/O echocardiogram OS 20/40    EF 60-65% with mild concentric LVH. Normal wall motion. Grade 1 diastolic dysfunction. Trace aortic regurgitation. No clear-cut evidence of murmur source  . Abnormal exercise tolerance test 11/24/2012    Did not reach 85% maximum heart rate. No ischemic changes noted at 79%. Notably beta blocker was not held; no arrhythmias noted.    Past Surgical History  Procedure Laterality Date  . Varicose vein surgery  1990  . Cesarean section      There were no vitals filed for this visit.  Visit Diagnosis:  Hip stiffness, right - Plan: PT plan of care cert/re-cert  Weakness of both hips - Plan: PT plan of care cert/re-cert  Knee pain, chronic, right - Plan: PT plan of care cert/re-cert  Hip pain, right - Plan: PT plan of care cert/re-cert      Subjective Assessment - 08/31/14 1041    Subjective Something "popped" in knee 1 year ago after walking on down steep steps; NWB for a couple of days. Pain has continued in the "back of the knee" for the past  year. She is going to [place] on 7/29 and wants to alleviate pain before the trip to climb [employer].    How long can you stand comfortably? 30 minutes; does not feel limited    How long can you walk comfortably? 1 hour    Diagnostic tests None    Patient Stated Goals Reduce pain, strengthen LE, knee, be able to drive without pain, walk without pain, increase sleep time without being woken up by pain    Currently in Pain? Yes   Pain Score 3    Pain Location Knee   Pain Orientation Posterior;Left   Pain Descriptors / Indicators Stabbing;Aching   Pain Type Chronic pain   Pain Radiating Towards Area of the IT band    Pain Onset More than a month ago   Pain Frequency Intermittent   Aggravating Factors  Walking, driving (stationary position)   Pain Relieving Factors Aleve,    Effect of Pain on Daily Activities Immediate onset of pain with driving, immediate onset of pain with walking             [facility] PT Assessment - 08/31/14 0001    Assessment   Medical Diagnosis M25.562-Lateral Knee Pain, Left   Onset Date/Surgical Date 08/30/13   Next MD Visit Not scheduled; may make one for coritcosteroid shot   Prior Therapy Not since childhood    Precautions  Precautions Other (comment)   Restrictions   Weight Bearing Restrictions No   Balance Screen   Has the patient fallen in the past 6 months No   Has the patient had a decrease in activity level because of a fear of falling?  No   Is the patient reluctant to leave their home because of a fear of falling?  No   Home [employer] residence   Living Arrangements Spouse/significant other   Prior Function   Level of Independence Independent   Cognition   Overall Cognitive Status Within Functional Limits for tasks assessed   Observation/Other Assessments   Focus on Therapeutic Outcomes (FOTO)  49%   ROM / Strength   AROM / PROM / Strength AROM;PROM;Strength   PROM   Overall PROM  Deficits   Overall PROM  Comments Limited hip IR motion bilaterally    Strength   Overall Strength Deficits   Overall Strength Comments Bil hip flexion: 3+/5; bil hip IR/ER: 5/5   All other LE motion 5/5    Special Tests    Special Tests Hip Special Tests   Hip Special Tests  Ober's Test  + Noble compression test Lt.;    Ober's Test   Findings Positive   Side Left                   [facility] Adult PT Treatment/Exercise - 08/31/14 0001    Modalities   Modalities Iontophoresis   Iontophoresis   Type of Iontophoresis Dexamethasone  #1   Location Lt. IT band at iliac crest   Dose 1.0cc   Time 6 hour wear time                PT Education - 08/31/14 1116    Education provided Yes   Education Details ionto, HEP    Person(s) Educated Patient   Methods Explanation;Demonstration   Comprehension Verbalized understanding;Returned demonstration          PT Short Term Goals - 08/31/14 1123    PT SHORT TERM GOAL #1   Title Will be independent with HEP    Time 4   Period Weeks   Status New   PT SHORT TERM GOAL #2   Title Will report 40% less Lt. knee pain after 10 minutes of ambulation    Time 4   Period Weeks   Status New   PT SHORT TERM GOAL #3   Title Will report 40% decrease in Lt. knee with driving    Time 4   Period Weeks   Status New           PT Long Term Goals - 08/31/14 1124    PT LONG TERM GOAL #1   Title Will be independent in advanced HEP    Time 8   Period Weeks   Status New   PT LONG TERM GOAL #2   Title Will report ambulating 30 minutes before pain onset in Lt. knee    Time 8   Period Weeks   Status New   PT LONG TERM GOAL #3   Title Reduce FOTO limitation to < or = to 40%    Time 8   Period Weeks   Status New   PT LONG TERM GOAL #4   Title Will report driving for 30 minutes with 2/10 in Lt. knee    Time 8   Period Weeks   Status New  Plan - 08/31/14 1117    Clinical Impression Statement 59 y.o female presents with Lt. knee  pain and hip pain and that began one year ago after descending stairs and hearing a loud pop. Pain interferes with ability to walk, drive and stand.  Positive Ober and Noble compression test indicate potential IT band involvement for Lt. knee pain. Will reqiure skilled PT for pain management, hip/knee strengthening, flexiblity exercises and endurance activities for daily ADLs and for upcoming trip to [place] on 7/29.     Pt will benefit from skilled therapeutic intervention in order to improve on the following deficits Decreased range of motion;Difficulty walking;Decreased activity tolerance;Pain;Decreased strength   Rehab Potential Good   PT Frequency 2x / week   PT Duration 8 weeks   PT Treatment/Interventions Moist Heat;Ultrasound;Therapeutic activities;Therapeutic exercise;Gait training;Stair training;Iontophoresis 4mg /ml Dexamethasone;Functional mobility training;Patient/family education;Neuromuscular re-education;Passive range of motion;Manual techniques;Energy conservation;Electrical Stimulation;Cryotherapy   PT Next Visit Plan Review HEP and add strengthening exercises, begin with glute strengthening (flexion/abduction) , review foam roll technique for IT band in sidelying, check SLR/provide hamstring exercises    Recommended Other Services Treat for IT band and related hip pain    Consulted and Agree with Plan of Care Patient         Problem List Patient Active Problem List   Diagnosis Date Noted  . Exertional dyspnea - with Beta Blockers 01/06/2014  . Chronotropic incompetence with beta blockers 01/06/2014    Class: Question of  . Fatigue 06/18/2013  . Chest pain with low risk for cardiac etiology 11/30/2012  . Paroxysmal SVT (supraventricular tachycardia) 10/06/2012  . Atrial flutter 10/06/2012    Class: Diagnosis of  . Aortic valve sclerosis 10/06/2012  . Paroxysmal atrial fibrillation /flutter 09/19/2012  . Hot flashes 08/28/2012  . FH: breast cancer in first degree relative  08/28/2012  . Scleritis and episcleritis of right eye 08/28/2012    Corinne Goucher, SPT 08/31/2014 12:09 PM   During this treatment session, the therapist was present, participating in, and directing the treatment. TAKACS,KELLY 08/31/2014, 12:09 PM  [facility] [address], [place], [place], [address] Phone: [phone]   Fax:  [phone]

## 2014-09-04 ENCOUNTER — Other Ambulatory Visit: Payer: Self-pay | Admitting: Cardiology

## 2014-09-07 ENCOUNTER — Ambulatory Visit: Payer: BLUE CROSS/BLUE SHIELD | Attending: Family Medicine | Admitting: Physical Therapy

## 2014-09-07 ENCOUNTER — Encounter: Payer: Self-pay | Admitting: Physical Therapy

## 2014-09-07 DIAGNOSIS — M6289 Other specified disorders of muscle: Secondary | ICD-10-CM | POA: Insufficient documentation

## 2014-09-07 DIAGNOSIS — M25562 Pain in left knee: Secondary | ICD-10-CM | POA: Insufficient documentation

## 2014-09-07 DIAGNOSIS — G8929 Other chronic pain: Secondary | ICD-10-CM | POA: Insufficient documentation

## 2014-09-07 DIAGNOSIS — M25651 Stiffness of right hip, not elsewhere classified: Secondary | ICD-10-CM | POA: Insufficient documentation

## 2014-09-07 DIAGNOSIS — M25561 Pain in right knee: Secondary | ICD-10-CM | POA: Insufficient documentation

## 2014-09-07 DIAGNOSIS — M25551 Pain in right hip: Secondary | ICD-10-CM

## 2014-09-07 DIAGNOSIS — R29898 Other symptoms and signs involving the musculoskeletal system: Secondary | ICD-10-CM

## 2014-09-07 NOTE — Therapy (Signed)
[facility] [address], [place], [place], [address] Phone: [phone]   Fax:  [phone]  Physical Therapy Treatment  Patient Details  Name: Veronica Valencia MRN: [id number] Date of Birth: [birth year] Referring Provider:  Lucretia Kern, DO  Encounter Date: 09/07/2014      PT End of Session - 09/07/14 1338    Visit Number 2   Date for PT Re-Evaluation 10/26/14   PT Start Time 0848   PT Stop Time 0930   PT Time Calculation (min) 42 min   Activity Tolerance Patient tolerated treatment well   Behavior During Therapy [facility] for tasks assessed/performed      Past Medical History  Diagnosis Date  . Hot flashes   . Episcleritis of right eye   . Paroxysmal SVT (supraventricular tachycardia) 10/06/2012    This may have simply been misdiagnosed A. fib  . PAF (paroxysmal atrial fibrillation) July 2014  . Paroxysmal atrial flutter August 2014  . H/O echocardiogram OS 20/40    EF 60-65% with mild concentric LVH. Normal wall motion. Grade 1 diastolic dysfunction. Trace aortic regurgitation. No clear-cut evidence of murmur source  . Abnormal exercise tolerance test 11/24/2012    Did not reach 85% maximum heart rate. No ischemic changes noted at 79%. Notably beta blocker was not held; no arrhythmias noted.    Past Surgical History  Procedure Laterality Date  . Varicose vein surgery  1990  . Cesarean section      There were no vitals filed for this visit.  Visit Diagnosis:  Weakness of both hips  Knee pain, chronic, right  Hip stiffness, right  Hip pain, right      Subjective Assessment - 09/07/14 0904    Subjective Pt reports most difficult to negotiate the stairs or sitting in the car, what cuases pain on med Hamstring insersion and ITB left   Currently in Pain? Yes   Pain Score 4    Pain Location Knee   Pain Orientation Left;Posterior  med distal insertion of hamstrings   Pain Descriptors / Indicators  Stabbing;Aching   Pain Type Chronic pain   Pain Onset More than a month ago   Pain Frequency Intermittent   Multiple Pain Sites No                         [facility] Adult PT Treatment/Exercise - 09/07/14 0001    Exercises   Exercises Knee/Hip   Knee/Hip Exercises: Stretches   ITB Stretch 3 reps;20 seconds  for Lt ITB in Rt sidelying   Other Knee/Hip Stretches unilateral ITB stretch in supine each leg   Other Knee/Hip Stretches Foam roll ITB each side x 3 and glut rolls x 3   Knee/Hip Exercises: Aerobic   Stationary Bike L 1 x 34min   Knee/Hip Exercises: Prone   Other Prone Exercises Gluteal activation x 10 with 5 sec hold   Modalities   Modalities Iontophoresis;Ultrasound   Ultrasound   Ultrasound Location Lt hip area in Rt sidelying   Ultrasound Parameters 100%, 1 MHz, 1.2Wcm, x 6 min   Ultrasound Goals Pain   Iontophoresis   Type of Iontophoresis Dexamethasone   Location Lt. IT band at iliac crest   Dose 1.0cc   Time 6 hour wear time                  PT Short Term Goals - 09/07/14 1342    PT SHORT TERM GOAL #1  Title Will be independent with HEP    Time 4   Period Weeks   Status On-going   PT SHORT TERM GOAL #2   Title Will report 40% less Lt. knee pain after 10 minutes of ambulation    Time 4   Period Weeks   Status On-going   PT SHORT TERM GOAL #3   Title Will report 40% decrease in Lt. knee with driving    Time 4   Period Weeks   Status On-going           PT Long Term Goals - 09/07/14 1342    PT LONG TERM GOAL #1   Title Will be independent in advanced HEP    Time 8   Period Weeks   Status On-going   PT LONG TERM GOAL #2   Title Will report ambulating 30 minutes before pain onset in Lt. knee    Time 8   Period Weeks   Status On-going   PT LONG TERM GOAL #3   Title Reduce FOTO limitation to < or = to 40%    Time 8   Period Weeks   Status On-going   PT LONG TERM GOAL #4   Title Will report driving for 30 minutes with  2/10 in Lt. knee    Time 8   Period Weeks   Status On-going               Plan - 09/07/14 1339    Clinical Impression Statement Pt is a 59 year old female with Lt knee and hip pain that began one year ago, after descending stairs and hearing a loud pop in knee. pain interferes with ability to walk, drive and stand. . Pt will benefit from skilled PT to adress pain, and improve flexibility, strength and enduracne for daily activities and upcoming trip to [place] 7/29   Rehab Potential Good   PT Frequency 2x / week   PT Duration 8 weeks   PT Treatment/Interventions Moist Heat;Ultrasound;Therapeutic activities;Therapeutic exercise;Gait training;Stair training;Iontophoresis 4mg /ml Dexamethasone;Functional mobility training;Patient/family education;Neuromuscular re-education;Passive range of motion;Manual techniques;Energy conservation;Electrical Stimulation;Cryotherapy   PT Next Visit Plan Review HEP and add strengthening exercises, begin with glute strengthening (flexion/abduction) , review foam roll technique for IT band in sidelying, check SLR/provide hamstring exercises    Consulted and Agree with Plan of Care Patient        Problem List Patient Active Problem List   Diagnosis Date Noted  . Exertional dyspnea - with Beta Blockers 01/06/2014  . Chronotropic incompetence with beta blockers 01/06/2014    Class: Question of  . Fatigue 06/18/2013  . Chest pain with low risk for cardiac etiology 11/30/2012  . Paroxysmal SVT (supraventricular tachycardia) 10/06/2012  . Atrial flutter 10/06/2012    Class: Diagnosis of  . Aortic valve sclerosis 10/06/2012  . Paroxysmal atrial fibrillation /flutter 09/19/2012  . Hot flashes 08/28/2012  . FH: breast cancer in first degree relative 08/28/2012  . Scleritis and episcleritis of right eye 08/28/2012    NAUMANN-HOUEGNIFIO,Tion Tse PTA 09/07/2014, 1:46 PM  [facility] [address] [place], [place], [address] Phone: [phone]   Fax:  [phone]

## 2014-09-07 NOTE — Telephone Encounter (Signed)
Rx(s) sent to pharmacy electronically.  

## 2014-09-13 ENCOUNTER — Ambulatory Visit: Payer: BLUE CROSS/BLUE SHIELD

## 2014-09-13 DIAGNOSIS — M25551 Pain in right hip: Secondary | ICD-10-CM

## 2014-09-13 DIAGNOSIS — G8929 Other chronic pain: Secondary | ICD-10-CM

## 2014-09-13 DIAGNOSIS — M6289 Other specified disorders of muscle: Secondary | ICD-10-CM | POA: Diagnosis not present

## 2014-09-13 DIAGNOSIS — M25651 Stiffness of right hip, not elsewhere classified: Secondary | ICD-10-CM

## 2014-09-13 DIAGNOSIS — R29898 Other symptoms and signs involving the musculoskeletal system: Secondary | ICD-10-CM

## 2014-09-13 DIAGNOSIS — M25561 Pain in right knee: Principal | ICD-10-CM

## 2014-09-13 NOTE — Therapy (Signed)
[facility] [address] [place], [place], [address] Phone: [phone]   Fax:  [phone]  Physical Therapy Treatment  Patient Details  Name: Veronica Valencia MRN: [id number] Date of Birth: 1956/01/23 Referring Provider:  Lucretia Kern, DO  Encounter Date: 09/13/2014      PT End of Session - 09/13/14 0923    Visit Number 3   Date for PT Re-Evaluation 10/26/14   PT Start Time 0850   PT Stop Time 0932   PT Time Calculation (min) 42 min   Activity Tolerance Patient tolerated treatment well   Behavior During Therapy [facility] for tasks assessed/performed      Past Medical History  Diagnosis Date  . Hot flashes   . Episcleritis of right eye   . Paroxysmal SVT (supraventricular tachycardia) 10/06/2012    This may have simply been misdiagnosed A. fib  . PAF (paroxysmal atrial fibrillation) July 2014  . Paroxysmal atrial flutter August 2014  . H/O echocardiogram OS 20/40    EF 60-65% with mild concentric LVH. Normal wall motion. Grade 1 diastolic dysfunction. Trace aortic regurgitation. No clear-cut evidence of murmur source  . Abnormal exercise tolerance test 11/24/2012    Did not reach 85% maximum heart rate. No ischemic changes noted at 79%. Notably beta blocker was not held; no arrhythmias noted.    Past Surgical History  Procedure Laterality Date  . Varicose vein surgery  1990  . Cesarean section      There were no vitals filed for this visit.  Visit Diagnosis:  Knee pain, chronic, right  Weakness of both hips  Hip stiffness, right  Hip pain, right      Subjective Assessment - 09/13/14 0856    Subjective Increaesd pain after last session, potentially due to foam rolling    How long can you stand comfortably? 30 minutes; does not feel limited    How long can you walk comfortably? 1 hour    Patient Stated Goals Reduce pain, strengthen LE, knee, be able to drive without pain, walk without pain, increase  sleep time without being woken up by pain    Currently in Pain? No/denies   Pain Onset --                         [facility] Adult PT Treatment/Exercise - 09/13/14 0001    Knee/Hip Exercises: Stretches   ITB Stretch 3 reps;20 seconds  for Lt ITB in Rt sidelying   Knee/Hip Exercises: Aerobic   Stationary Bike L 1 x 70min   Knee/Hip Exercises: Standing   Functional Squat 3 sets;10 reps  ball in between legs   SLS 1 min each sidex3  trampoline    Other Standing Knee Exercises side stepping with red band 3x10    Knee/Hip Exercises: Supine   Bridges with Ball Squeeze --   Other Supine Knee/Hip Exercises Bridging 3x10   Knee/Hip Exercises: Sidelying   Clams 3x10, bilaterally    Other Sidelying Knee/Hip Exercises --   Modalities   Modalities Iontophoresis;Ultrasound   Iontophoresis   Type of Iontophoresis Dexamethasone   Location Lt. glute near GT    Dose 1.0cc   Time 6 hour wear time                PT Education - 09/13/14 0934    Education provided Yes   Education Details ionto, HEP    Person(s) Educated Patient   Methods Explanation;Demonstration;Handout   Comprehension  Verbalized understanding;Returned demonstration          PT Short Term Goals - 09/13/14 5400    PT SHORT TERM GOAL #1   Title Will be independent with HEP   Reports doing IT band stretches at home and they seem to help. Provided glute strengthening HEP today    Time 4   Period Weeks   Status On-going   PT SHORT TERM GOAL #3   Title Will report 40% decrease in Lt. knee with driving   Continues to report increased pain with driving/sitting in a pew; does not report reduction with pain    Time 4   Period Weeks   Status On-going           PT Long Term Goals - 09/07/14 1342    PT LONG TERM GOAL #1   Title Will be independent in advanced HEP    Time 8   Period Weeks   Status On-going   PT LONG TERM GOAL #2   Title Will report ambulating 30 minutes before pain onset in Lt.  knee    Time 8   Period Weeks   Status On-going   PT LONG TERM GOAL #3   Title Reduce FOTO limitation to < or = to 40%    Time 8   Period Weeks   Status On-going   PT LONG TERM GOAL #4   Title Will report driving for 30 minutes with 2/10 in Lt. knee    Time 8   Period Weeks   Status On-going               Plan - 09/13/14 0935    Clinical Impression Statement Foam rolling irritated tissue at last session. Demonstrated poor balance and glute med strength with functional step down bilaterally, indicating weakness in glute muscles. Knees adduct with functional squat; performed glute strengthening exercises to improve LE mechanics. Pt tolerated exercises well without complaint of knee pain. Will continue to benefit from skilled PT for continued hip/LE strengthening and balance.    Pt will benefit from skilled therapeutic intervention in order to improve on the following deficits Difficulty walking;Decreased activity tolerance;Pain;Decreased strength;Decreased coordination;Decreased balance   PT Frequency 2x / week   PT Duration 8 weeks   PT Treatment/Interventions Moist Heat;Ultrasound;Therapeutic activities;Therapeutic exercise;Gait training;Stair training;Iontophoresis 4mg /ml Dexamethasone;Functional mobility training;Patient/family education;Neuromuscular re-education;Passive range of motion;Manual techniques;Energy conservation;Electrical Stimulation;Cryotherapy   PT Next Visit Plan Continue with glute strengthening, review clam shells and SLS exercises    Consulted and Agree with Plan of Care Patient        Problem List Patient Active Problem List   Diagnosis Date Noted  . Exertional dyspnea - with Beta Blockers 01/06/2014  . Chronotropic incompetence with beta blockers 01/06/2014    Class: Question of  . Fatigue 06/18/2013  . Chest pain with low risk for cardiac etiology 11/30/2012  . Paroxysmal SVT (supraventricular tachycardia) 10/06/2012  . Atrial flutter 10/06/2012     Class: Diagnosis of  . Aortic valve sclerosis 10/06/2012  . Paroxysmal atrial fibrillation /flutter 09/19/2012  . Hot flashes 08/28/2012  . FH: breast cancer in first degree relative 08/28/2012  . Scleritis and episcleritis of right eye 08/28/2012   Takila Kronberg, SPT 09/13/2014 10:02 AM   During this treatment session, the therapist was present, participating in, and directing the treatment. TAKACS,KELLY 09/13/2014, 10:02 AM  [facility] [address], [place], [place], [address] Phone: [phone]   Fax:  [phone]

## 2014-09-13 NOTE — Patient Instructions (Addendum)
Abductor Strength: Bridge Pose (Strap)   Lift hips up into the air. Hold for 3 seconds. Do 10x for 3 sets.   Clam Shell 45 Degrees   Lying with hips and knees bent 45, one pillow between knees and ankles. Lift knee. Be sure pelvis does not roll backward. Do not arch back. Do _10__ times, each leg, 3 sets a day.   AMBULATION: Side Step   Step sideways. Repeat in opposite direction for 10 ft. Do 4 times   .  IONTOPHORESIS PATIENT PRECAUTIONS & CONTRAINDICATIONS   Redness under one or both electrodes can occur.  This is characterized by a uniform redness that usually disappears within 12 hours of treatment.  Small pinhead size blisters may result in response to the drug.  Contact your physician if the problem persists more than 24 hours.  On rare occasions, iontophoresis therapy can result in temporary skin reactions such as rash, inflammation, irritation or burns.  The skin reaction bay be the result of individual sensitivity to the ionic solution used, the condition of the skin at the start of treatment, reaction to the materials in the electrodes, allergies or sensitivity to dexamethasone, or a poor connection between the patch and your skin.  Discontinue using iontophoresis if you have any of these reactions and report to your therapist.  Remove the Patch or electrodes if you have any undue sensation of pain or burning during the treatment and report discomfort to your therapist.  Tell your Therapist if you have had known adverse reactions to the application of electrical current.  If using the Patch, the LED light will turn off when treatment is complete and the patch can be removed.  Approximate treatment time is 1-3 hours.  Remove the patch when light goes off or after 6 hours.  The Patch can be worn during normal activity, however excessive motion where the electrodes have been placed can cause poor contact between the skin and the electrode or uneven electrical current  resulting in greater risk of skin irritation.  Keep out of the reach of children.   DO NOT use if you have a cardiac pacemaker or any other electrically sensitive  Implanted device.  DO NOT use if you have known sensitivity to Dexamethasone.  DO NOT use during Magnetic Resonance Imaging (MRI).  DO NOT use over broken or compromised skin (e.g. Sunburn, cuts or acne) due to the increased risk of skin reaction.  DO NOT SHAVE over the area to be treated:  To establish good contact between the Patch and the skin excessive hair may be clipped.  DO NOT place the Patch or electrodes on or over your eyes, directly over your heart or brain.  DO NOT reuse the Patch or electrodes as this may cause burns to occur.

## 2014-09-15 ENCOUNTER — Encounter: Payer: Self-pay | Admitting: Physical Therapy

## 2014-09-15 ENCOUNTER — Ambulatory Visit: Payer: BLUE CROSS/BLUE SHIELD | Admitting: Physical Therapy

## 2014-09-15 DIAGNOSIS — R29898 Other symptoms and signs involving the musculoskeletal system: Secondary | ICD-10-CM

## 2014-09-15 DIAGNOSIS — M25551 Pain in right hip: Secondary | ICD-10-CM

## 2014-09-15 DIAGNOSIS — M6289 Other specified disorders of muscle: Secondary | ICD-10-CM | POA: Diagnosis not present

## 2014-09-15 DIAGNOSIS — M25651 Stiffness of right hip, not elsewhere classified: Secondary | ICD-10-CM

## 2014-09-15 DIAGNOSIS — G8929 Other chronic pain: Secondary | ICD-10-CM

## 2014-09-15 DIAGNOSIS — M25561 Pain in right knee: Principal | ICD-10-CM

## 2014-09-15 NOTE — Patient Instructions (Signed)
Sit to Stand: Hard    Sit on ball or level seat, feet hip width apart on floor ignore the roller under the feet.  Look down and look at your alignment:  Stand and balance. Reverse action to sit.  Repeat 5-10___ times. Do _1-3__ sessions per day. Thighs straight, knee cap aligned with the space between the big toe/little toe.   Therapeutic - Knee to Chest   Bend knees toward chest one at a time. Use hands only to support position. Hold _20___ seconds. Repeat _3___ times. Do other side but RT side will not need as much. THEN, do the cross over stretch: cross knee over the opposite knee pulling it to the opposite shoulder. You can roll the pelvis slightly. Do 3x 20 sec each.   HIP: External Rotation   Sit at edge of surface. Cross one leg over other knee. Press down gently on knee. Hold __20_ seconds. __1-2_ reps per set, 1-3___ sets per day. This can also be done  Laying down, stretching position is exactly the same.   Flexors, Supine Bridge   Lie supine, feet shoulder-width apart. Lift hips toward ceiling. Hold 5___ seconds. Repeat __10_ times per session. Do __1-2_ sessions per day. You can put a gym ball between your knees: squeeze ball, flatten low back and roll your spine up.   Copyright  VHI. All rights reserved.    Copyright  VHI. All rights reserved.    Copyright  VHI. All rights reserved.    Copyright  VHI. All rights reserved.

## 2014-09-15 NOTE — Therapy (Signed)
[facility] [address] [place], [place], [address] Phone: [phone]   Fax:  [phone]  Physical Therapy Treatment  Patient Details  Name: Veronica Valencia MRN: [id number] Date of Birth: 01-Oct-1955 Referring Provider:  Lucretia Kern, DO  Encounter Date: 09/15/2014      PT End of Session - 09/15/14 1106    Visit Number 4   Date for PT Re-Evaluation 10/26/14   PT Start Time 1018   PT Stop Time 1106   PT Time Calculation (min) 48 min   Activity Tolerance Patient tolerated treatment well   Behavior During Therapy [facility] for tasks assessed/performed      Past Medical History  Diagnosis Date  . Hot flashes   . Episcleritis of right eye   . Paroxysmal SVT (supraventricular tachycardia) 10/06/2012    This may have simply been misdiagnosed A. fib  . PAF (paroxysmal atrial fibrillation) July 2014  . Paroxysmal atrial flutter August 2014  . H/O echocardiogram OS 20/40    EF 60-65% with mild concentric LVH. Normal wall motion. Grade 1 diastolic dysfunction. Trace aortic regurgitation. No clear-cut evidence of murmur source  . Abnormal exercise tolerance test 11/24/2012    Did not reach 85% maximum heart rate. No ischemic changes noted at 79%. Notably beta blocker was not held; no arrhythmias noted.    Past Surgical History  Procedure Laterality Date  . Varicose vein surgery  1990  . Cesarean section      There were no vitals filed for this visit.  Visit Diagnosis:  Knee pain, chronic, right  Weakness of both hips  Hip stiffness, right  Hip pain, right      Subjective Assessment - 09/15/14 1024    Subjective Since last tx pt was very pleased with results, no pain the rest of the day.    Currently in Pain? No/denies   Multiple Pain Sites No                         [facility] Adult PT Treatment/Exercise - 09/15/14 0001    Knee/Hip Exercises: Aerobic   Stationary Bike L2 x 8 min   Knee/Hip  Exercises: Sidelying   Clams yellow band added 2 x10  VC to engage lower ab muscles   Iontophoresis   Type of Iontophoresis Dexamethasone  #4   Location Lt. glute near GT    Dose 68ml   Time 6 hr, skin intact, pt verbally understands wear time                PT Education - 09/15/14 1033    Education provided Yes   Education Details HEP: sit to stand, hip stretches   Person(s) Educated Patient   Methods Explanation;Demonstration;Handout   Comprehension Verbalized understanding;Returned demonstration          PT Short Term Goals - 09/15/14 1314    PT SHORT TERM GOAL #1   Title Will be independent with HEP    Time 4   Period Weeks   Status Achieved   PT SHORT TERM GOAL #2   Title Will report 40% less Lt. knee pain after 10 minutes of ambulation    Time 4   Period Weeks   Status On-going  intermittently   PT SHORT TERM GOAL #3   Title Will report 40% decrease in Lt. knee with driving    Time 4   Period Weeks   Status On-going  PT Long Term Goals - 09/07/14 1342    PT LONG TERM GOAL #1   Title Will be independent in advanced HEP    Time 8   Period Weeks   Status On-going   PT LONG TERM GOAL #2   Title Will report ambulating 30 minutes before pain onset in Lt. knee    Time 8   Period Weeks   Status On-going   PT LONG TERM GOAL #3   Title Reduce FOTO limitation to < or = to 40%    Time 8   Period Weeks   Status On-going   PT LONG TERM GOAL #4   Title Will report driving for 30 minutes with 2/10 in Lt. knee    Time 8   Period Weeks   Status On-going               Plan - 09/15/14 1311    Clinical Impression Statement Had no pain after her last visit all day long. Pt admitts she needs to be more of a consistent stretcher of her hips. She is continuing work on her LE alignment as well as her glute strength.    Pt will benefit from skilled therapeutic intervention in order to improve on the following deficits Difficulty  walking;Decreased activity tolerance;Pain;Decreased strength;Decreased coordination;Decreased balance   Rehab Potential Good   PT Frequency 2x / week   PT Duration 8 weeks   PT Treatment/Interventions Moist Heat;Ultrasound;Therapeutic activities;Therapeutic exercise;Gait training;Stair training;Iontophoresis 4mg /ml Dexamethasone;Functional mobility training;Patient/family education;Neuromuscular re-education;Passive range of motion;Manual techniques;Energy conservation;Electrical Stimulation;Cryotherapy   PT Next Visit Plan Review new stretches and continue strengthening glute medius via abduction   Consulted and Agree with Plan of Care Patient        Problem List Patient Active Problem List   Diagnosis Date Noted  . Exertional dyspnea - with Beta Blockers 01/06/2014  . Chronotropic incompetence with beta blockers 01/06/2014    Class: Question of  . Fatigue 06/18/2013  . Chest pain with low risk for cardiac etiology 11/30/2012  . Paroxysmal SVT (supraventricular tachycardia) 10/06/2012  . Atrial flutter 10/06/2012    Class: Diagnosis of  . Aortic valve sclerosis 10/06/2012  . Paroxysmal atrial fibrillation /flutter 09/19/2012  . Hot flashes 08/28/2012  . FH: breast cancer in first degree relative 08/28/2012  . Scleritis and episcleritis of right eye 08/28/2012    Cambree Hendrix , PTA  09/15/2014, 1:17 PM  [facility] [address], [place], [place], [address] Phone: [phone]   Fax:  [phone]

## 2014-09-20 ENCOUNTER — Ambulatory Visit: Payer: BLUE CROSS/BLUE SHIELD | Admitting: Physical Therapy

## 2014-09-20 ENCOUNTER — Encounter: Payer: Self-pay | Admitting: Physical Therapy

## 2014-09-20 DIAGNOSIS — R29898 Other symptoms and signs involving the musculoskeletal system: Secondary | ICD-10-CM

## 2014-09-20 DIAGNOSIS — M6289 Other specified disorders of muscle: Secondary | ICD-10-CM | POA: Diagnosis not present

## 2014-09-20 DIAGNOSIS — M25561 Pain in right knee: Principal | ICD-10-CM

## 2014-09-20 DIAGNOSIS — G8929 Other chronic pain: Secondary | ICD-10-CM

## 2014-09-20 NOTE — Patient Instructions (Signed)
Hamstring Step 1   Straighten left knee. Keep knee level with other knee or on bolster. Hold __20-30 _ seconds, or 3-4 breaths. Relax knee by returning foot to start. Repeat _3__ times. Do 1-2 x day  Outer Hip Stretch: Reclined IT Band Stretch (Strap)   Strap around opposite foot, pull across only as far as possible with shoulders on mat. Hold for _3-4___ breaths. Repeat __3__ times each leg or at least a few more reps on the left.  Hip Adductor With Hip Flexed 90 Degrees   Wrap strap around left leg, outside calf, inside thigh, hold in same-side hand. With leg in maximal straight leg raise, pull leg out to side. Control leg movement with arm. Keep opposite leg still. Do not rotate pelvis or trunk. Hold 20-30___ seconds. Relax leg. If control of leg movement is difficult, wrap strap inside calf, outside thigh. Repeat _3__ times.  Copyright  VHI. All rights reserved.    Copyright  VHI. All rights reserved.   Copyright  VHI. All rights reserved.

## 2014-09-20 NOTE — Therapy (Signed)
[facility] [address], [place], [place], [address] Phone: [phone]   Fax:  [phone]  Physical Therapy Treatment  Patient Details  Name: Veronica Valencia MRN: [id number] Date of Birth: November 08, 1955 Referring Provider:  Lucretia Kern, DO  Encounter Date: 09/20/2014      PT End of Session - 09/20/14 1615    Date for PT Re-Evaluation 10/26/14      Past Medical History  Diagnosis Date  . Hot flashes   . Episcleritis of right eye   . Paroxysmal SVT (supraventricular tachycardia) 10/06/2012    This may have simply been misdiagnosed A. fib  . PAF (paroxysmal atrial fibrillation) July 2014  . Paroxysmal atrial flutter August 2014  . H/O echocardiogram OS 20/40    EF 60-65% with mild concentric LVH. Normal wall motion. Grade 1 diastolic dysfunction. Trace aortic regurgitation. No clear-cut evidence of murmur source  . Abnormal exercise tolerance test 11/24/2012    Did not reach 85% maximum heart rate. No ischemic changes noted at 79%. Notably beta blocker was not held; no arrhythmias noted.    Past Surgical History  Procedure Laterality Date  . Varicose vein surgery  1990  . Cesarean section      There were no vitals filed for this visit.  Visit Diagnosis:  Knee pain, chronic, right  Weakness of both hips      Subjective Assessment - 09/20/14 1546    Subjective Pain/symptom free x24 hrs, then pain slowly creeps; no pain in IT band, but in back of knee. Pt late today   Currently in Pain? Yes   Pain Score 3    Pain Location Knee   Pain Orientation Left;Posterior   Pain Descriptors / Indicators Aching   Aggravating Factors  Sitting   Pain Relieving Factors Wearing sneakers   Multiple Pain Sites No                         [facility] Adult PT Treatment/Exercise - 09/20/14 0001    Knee/Hip Exercises: Stretches   Active Hamstring Stretch Left;3 reps;20 seconds   ITB Stretch Left;3 reps;20 seconds    Other Knee/Hip Stretches Adductor stretch with stretch 3x 20 sec   Knee/Hip Exercises: Aerobic   Stationary Bike L2 x 8 min   Knee/Hip Exercises: Standing   Forward Step Up Left;3 sets;10 reps;Hand Hold: 1   Iontophoresis   Type of Iontophoresis Dexamethasone   Location Lt glute and post medial knee   Dose 1 ml   Time 6 hr skin intact                PT Education - 09/20/14 1553    Education provided Yes   Education Details Hip stretching series with band   Person(s) Educated Patient   Methods Explanation;Demonstration;Tactile cues;Verbal cues;Handout   Comprehension Verbalized understanding;Returned demonstration          PT Short Term Goals - 09/20/14 1559    PT SHORT TERM GOAL #1   Title Will be independent with HEP    Time 4   Period Weeks   Status Achieved   PT SHORT TERM GOAL #2   Title Will report 40% less Lt. knee pain after 10 minutes of ambulation    Time 4   Period Weeks   Status --  30%   PT SHORT TERM GOAL #3   Title Will report 40% decrease in Lt. knee with driving    Time 4  Period Weeks   Status Achieved  50%           PT Long Term Goals - 09/07/14 1342    PT LONG TERM GOAL #1   Title Will be independent in advanced HEP    Time 8   Period Weeks   Status On-going   PT LONG TERM GOAL #2   Title Will report ambulating 30 minutes before pain onset in Lt. knee    Time 8   Period Weeks   Status On-going   PT LONG TERM GOAL #3   Title Reduce FOTO limitation to < or = to 40%    Time 8   Period Weeks   Status On-going   PT LONG TERM GOAL #4   Title Will report driving for 30 minutes with 2/10 in Lt. knee    Time 8   Period Weeks   Status On-going               Plan - 09/20/14 1615    Clinical Impression Statement Pt is progressing nicely, less pain when walking and doing stairs; meeting driving goal today. Patches continue to bring pain down and we changed some hip stretches for her HEP. Hamstring stretching abolished  knee pain today.   Pt will benefit from skilled therapeutic intervention in order to improve on the following deficits Difficulty walking;Decreased activity tolerance;Pain;Decreased strength;Decreased coordination;Decreased balance   Rehab Potential Good   PT Frequency 2x / week   PT Duration 8 weeks   PT Treatment/Interventions Moist Heat;Ultrasound;Therapeutic activities;Therapeutic exercise;Gait training;Stair training;Iontophoresis 4mg /ml Dexamethasone;Functional mobility training;Patient/family education;Neuromuscular re-education;Passive range of motion;Manual techniques;Energy conservation;Electrical Stimulation;Cryotherapy   PT Next Visit Plan Pt has completed proximal hip patches, see how posterior knee is doing and mayne patch #2 there. Review hip stretches with strap and continue with step ups.        Problem List Patient Active Problem List   Diagnosis Date Noted  . Exertional dyspnea - with Beta Blockers 01/06/2014  . Chronotropic incompetence with beta blockers 01/06/2014    Class: Question of  . Fatigue 06/18/2013  . Chest pain with low risk for cardiac etiology 11/30/2012  . Paroxysmal SVT (supraventricular tachycardia) 10/06/2012  . Atrial flutter 10/06/2012    Class: Diagnosis of  . Aortic valve sclerosis 10/06/2012  . Paroxysmal atrial fibrillation /flutter 09/19/2012  . Hot flashes 08/28/2012  . FH: breast cancer in first degree relative 08/28/2012  . Scleritis and episcleritis of right eye 08/28/2012    Avaeh Ewer, PTA 09/20/2014, 4:20 PM  [facility] [address] [place], [place], [address] Phone: [phone]   Fax:  [phone]

## 2014-09-22 ENCOUNTER — Ambulatory Visit: Payer: BLUE CROSS/BLUE SHIELD | Admitting: Physical Therapy

## 2014-09-22 ENCOUNTER — Encounter: Payer: Self-pay | Admitting: Physical Therapy

## 2014-09-22 DIAGNOSIS — M25561 Pain in right knee: Principal | ICD-10-CM

## 2014-09-22 DIAGNOSIS — M25651 Stiffness of right hip, not elsewhere classified: Secondary | ICD-10-CM

## 2014-09-22 DIAGNOSIS — G8929 Other chronic pain: Secondary | ICD-10-CM

## 2014-09-22 DIAGNOSIS — M25551 Pain in right hip: Secondary | ICD-10-CM

## 2014-09-22 DIAGNOSIS — R29898 Other symptoms and signs involving the musculoskeletal system: Secondary | ICD-10-CM

## 2014-09-22 DIAGNOSIS — M6289 Other specified disorders of muscle: Secondary | ICD-10-CM | POA: Diagnosis not present

## 2014-09-22 NOTE — Patient Instructions (Addendum)
ABDUCTION: Side-Lying (Active)   Lie on left side, top leg straight, lift only few inches concentrating on keeping leg really straight.  Complete _3__ sets of 10___ repetitions. Perform 1___ sessions per day.  [email or web address].[email or web address]   HIP: Flexion / KNEE: Extension, Straight Leg Raise   Raise leg, keeping knee straight. Engage abdominals first, then lift leg.  Perform slowly. _10__ reps per set, _3 sets, 1x day   Copyright  VHI. All rights reserved.    Copyright  VHI. All rights reserved.

## 2014-09-22 NOTE — Therapy (Signed)
[facility] [address], [place], [place], [address] Phone: [phone]   Fax:  [phone]  Physical Therapy Treatment  Patient Details  Name: Veronica Valencia MRN: [id number] Date of Birth: Jul 30, 1955 Referring Provider:  Lucretia Kern, DO  Encounter Date: 09/22/2014      PT End of Session - 09/22/14 1651    Visit Number 6   Date for PT Re-Evaluation 10/26/14   PT Start Time 1628   PT Stop Time 1706   PT Time Calculation (min) 38 min   Activity Tolerance Patient tolerated treatment well   Behavior During Therapy [facility] for tasks assessed/performed      Past Medical History  Diagnosis Date  . Hot flashes   . Episcleritis of right eye   . Paroxysmal SVT (supraventricular tachycardia) 10/06/2012    This may have simply been misdiagnosed A. fib  . PAF (paroxysmal atrial fibrillation) July 2014  . Paroxysmal atrial flutter August 2014  . H/O echocardiogram OS 20/40    EF 60-65% with mild concentric LVH. Normal wall motion. Grade 1 diastolic dysfunction. Trace aortic regurgitation. No clear-cut evidence of murmur source  . Abnormal exercise tolerance test 11/24/2012    Did not reach 85% maximum heart rate. No ischemic changes noted at 79%. Notably beta blocker was not held; no arrhythmias noted.    Past Surgical History  Procedure Laterality Date  . Varicose vein surgery  1990  . Cesarean section      There were no vitals filed for this visit.  Visit Diagnosis:  Knee pain, chronic, right  Weakness of both hips  Hip stiffness, right  Hip pain, right      Subjective Assessment - 09/22/14 1630    Subjective Pain in IT band is 60%-65% better. Pain persistes behind the knee especially with climbing stairs, but better than it was. 15 min late, Ionto patches working well.    Currently in Pain? No/denies   Multiple Pain Sites No                         [facility] Adult PT Treatment/Exercise -  09/22/14 0001    Knee/Hip Exercises: Aerobic   Stationary Bike L3 x 6 min   Knee/Hip Exercises: Standing   Forward Step Up Left;3 sets;Hand Hold: 1;Step Height: 6"   Knee/Hip Exercises: Supine   Straight Leg Raises Strengthening;Left;2 sets;10 reps   Knee/Hip Exercises: Sidelying   Hip ABduction Strengthening;Left;3 sets;10 reps   Iontophoresis   Type of Iontophoresis Dexamethasone   Location Lt glute, post medial knee   Dose 46ml   Time 4 hr wear  Skin in tact                PT Education - 09/22/14 1641    Education provided Yes   Education Details SL hip abduction, step ups, SLR   Person(s) Educated Patient   Methods Explanation;Demonstration;Tactile cues;Verbal cues;Handout   Comprehension Verbalized understanding;Returned demonstration          PT Short Term Goals - 09/20/14 1559    PT SHORT TERM GOAL #1   Title Will be independent with HEP    Time 4   Period Weeks   Status Achieved   PT SHORT TERM GOAL #2   Title Will report 40% less Lt. knee pain after 10 minutes of ambulation    Time 4   Period Weeks   Status --  30%   PT SHORT TERM GOAL #  3   Title Will report 40% decrease in Lt. knee with driving    Time 4   Period Weeks   Status Achieved  50%           PT Long Term Goals - 09/07/14 1342    PT LONG TERM GOAL #1   Title Will be independent in advanced HEP    Time 8   Period Weeks   Status On-going   PT LONG TERM GOAL #2   Title Will report ambulating 30 minutes before pain onset in Lt. knee    Time 8   Period Weeks   Status On-going   PT LONG TERM GOAL #3   Title Reduce FOTO limitation to < or = to 40%    Time 8   Period Weeks   Status On-going   PT LONG TERM GOAL #4   Title Will report driving for 30 minutes with 2/10 in Lt. knee    Time 8   Period Weeks   Status On-going               Plan - 09/22/14 1701    Clinical Impression Statement IT band pain much improved, posterior knee pain still persisting although  improving just slower. Added some hip strengthening exercises for hip flexor and glute medius.    Pt will benefit from skilled therapeutic intervention in order to improve on the following deficits Difficulty walking;Decreased activity tolerance;Pain;Decreased strength;Decreased coordination;Decreased balance   Rehab Potential Good   PT Frequency 2x / week   PT Duration 8 weeks   PT Treatment/Interventions Moist Heat;Ultrasound;Therapeutic activities;Therapeutic exercise;Gait training;Stair training;Iontophoresis 4mg /ml Dexamethasone;Functional mobility training;Patient/family education;Neuromuscular re-education;Passive range of motion;Manual techniques;Energy conservation;Electrical Stimulation;Cryotherapy   PT Next Visit Plan #3 ionto for post knee, IT band ionto completed. Continue with LT hip strength   Consulted and Agree with Plan of Care Patient        Problem List Patient Active Problem List   Diagnosis Date Noted  . Exertional dyspnea - with Beta Blockers 01/06/2014  . Chronotropic incompetence with beta blockers 01/06/2014    Class: Question of  . Fatigue 06/18/2013  . Chest pain with low risk for cardiac etiology 11/30/2012  . Paroxysmal SVT (supraventricular tachycardia) 10/06/2012  . Atrial flutter 10/06/2012    Class: Diagnosis of  . Aortic valve sclerosis 10/06/2012  . Paroxysmal atrial fibrillation /flutter 09/19/2012  . Hot flashes 08/28/2012  . FH: breast cancer in first degree relative 08/28/2012  . Scleritis and episcleritis of right eye 08/28/2012    Libni Fusaro, PTA 09/22/2014, 5:04 PM  [facility] [address] [place], [place], [address] Phone: [phone]   Fax:  [phone]   LT hip flexor MMT 4/5, RT 5/5

## 2014-09-28 ENCOUNTER — Ambulatory Visit: Payer: BLUE CROSS/BLUE SHIELD

## 2014-09-28 DIAGNOSIS — M6289 Other specified disorders of muscle: Secondary | ICD-10-CM | POA: Diagnosis not present

## 2014-09-28 DIAGNOSIS — M25651 Stiffness of right hip, not elsewhere classified: Secondary | ICD-10-CM

## 2014-09-28 DIAGNOSIS — M25561 Pain in right knee: Principal | ICD-10-CM

## 2014-09-28 DIAGNOSIS — G8929 Other chronic pain: Secondary | ICD-10-CM

## 2014-09-28 DIAGNOSIS — R29898 Other symptoms and signs involving the musculoskeletal system: Secondary | ICD-10-CM

## 2014-09-28 NOTE — Therapy (Signed)
[facility] [address] [place], [place], [address] Phone: [phone]   Fax:  [phone]  Physical Therapy Treatment  Patient Details  Name: Veronica Valencia MRN: [id number] Date of Birth: 1956/02/21 Referring Provider:  Lucretia Kern, DO  Encounter Date: 09/28/2014      PT End of Session - 09/28/14 1552    Visit Number 7   Date for PT Re-Evaluation 10/26/14   PT Start Time 8527   PT Stop Time 1616   PT Time Calculation (min) 30 min   Activity Tolerance Patient tolerated treatment well   Behavior During Therapy [name] for tasks assessed/performed      Past Medical History  Diagnosis Date  . Hot flashes   . Episcleritis of right eye   . Paroxysmal SVT (supraventricular tachycardia) 10/06/2012    This may have simply been misdiagnosed A. fib  . PAF (paroxysmal atrial fibrillation) July 2014  . Paroxysmal atrial flutter August 2014  . H/O echocardiogram OS 20/40    EF 60-65% with mild concentric LVH. Normal wall motion. Grade 1 diastolic dysfunction. Trace aortic regurgitation. No clear-cut evidence of murmur source  . Abnormal exercise tolerance test 11/24/2012    Did not reach 85% maximum heart rate. No ischemic changes noted at 79%. Notably beta blocker was not held; no arrhythmias noted.    Past Surgical History  Procedure Laterality Date  . Varicose vein surgery  1990  . Cesarean section      There were no vitals filed for this visit.  Visit Diagnosis:  Knee pain, chronic, right  Weakness of both hips  Hip stiffness, right      Subjective Assessment - 09/28/14 1601    Subjective Pain in IT band has been getting better. 15 minutes late to the appointment. Has some pain behind the knees after walking for 3 miles.   How long can you walk comfortably? able to walk 3 miles with less pain    Patient Stated Goals Reduce pain, strengthen LE, knee, be able to drive without pain, walk without pain, increase  sleep time without being woken up by pain    Currently in Pain? No/denies                         [facility] Adult PT Treatment/Exercise - 09/28/14 0001    Knee/Hip Exercises: Stretches   Active Hamstring Stretch Left;20 seconds;1 rep  Hamstring, adductor stretch with green band   Knee/Hip Exercises: Standing   Forward Step Up 3 sets;Both;10 reps  8 in    Step Down 3 sets;Hand Hold: 1;Step Height: 8";10 reps   Knee/Hip Exercises: Supine   Other Supine Knee/Hip Exercises Bridging 3x10  with red theraband    Knee/Hip Exercises: Sidelying   Clams 2x7; red theraband   Iontophoresis   Type of Iontophoresis Dexamethasone   Location Lt glute, post medial knee   Dose 7ml   Time 4 hr wear  #3 on posterior knee; no more for IT                   PT Short Term Goals - 09/28/14 1606    PT SHORT TERM GOAL #1   Title Will be independent with HEP    Time 4   PT SHORT TERM GOAL #2   Title Will report 40% less Lt. knee pain after 10 minutes of ambulation   Reports 80% improvement with walking 3 miles    Time  4   Period Weeks   Status Achieved           PT Long Term Goals - 09/28/14 1618    PT LONG TERM GOAL #1   Title Will be independent in advanced HEP   Demonstrated HEP exercises with proper form, provided  a red theraband  for advancement of exercises   Time 8   Period Weeks   Status Achieved   PT LONG TERM GOAL #2   Title Will report ambulating 30 minutes before pain onset in Lt. knee   Able to ambulate for 3 miles straight before pain sets in; has some soreness afterward    Time 8   Period Weeks   Status Achieved               Plan - 09/28/14 1620    Clinical Impression Statement Knee pain/IT band has continued to improves with stretching and exercise, continues to have some pain in the back of the knee after ambluating 3 miles and pain is minimal after driving long distances. Will be leaving for [place] on Friday; will benefit from skilled PT  for  LE strengthening and flexiblity program.     Pt will benefit from skilled therapeutic intervention in order to improve on the following deficits Difficulty walking;Decreased activity tolerance;Pain;Decreased strength;Decreased coordination;Decreased balance   Rehab Potential Good   PT Frequency 2x / week   PT Duration 8 weeks   PT Treatment/Interventions Moist Heat;Ultrasound;Therapeutic activities;Therapeutic exercise;Gait training;Stair training;Iontophoresis 4mg /ml Dexamethasone;Functional mobility training;Patient/family education;Neuromuscular re-education;Passive range of motion;Manual techniques;Energy conservation;Electrical Stimulation;Cryotherapy   PT Next Visit Plan #4 ionto for posterior knee only; create flexibilty program for peru/provide HEP to do while away, discuss potential D/C    Consulted and Agree with Plan of Care Patient        Problem List Patient Active Problem List   Diagnosis Date Noted  . Exertional dyspnea - with Beta Blockers 01/06/2014  . Chronotropic incompetence with beta blockers 01/06/2014    Class: Question of  . Fatigue 06/18/2013  . Chest pain with low risk for cardiac etiology 11/30/2012  . Paroxysmal SVT (supraventricular tachycardia) 10/06/2012  . Atrial flutter 10/06/2012    Class: Diagnosis of  . Aortic valve sclerosis 10/06/2012  . Paroxysmal atrial fibrillation /flutter 09/19/2012  . Hot flashes 08/28/2012  . FH: breast cancer in first degree relative 08/28/2012  . Scleritis and episcleritis of right eye 08/28/2012   Stachia Slutsky, SPT 09/28/2014 4:33 PM  During this treatment session, the therapist was present, participating in, and directing the treatment. TAKACS,KELLY, PT 09/28/2014, 4:33 PM  [facility] [address], [place], [place], [address] Phone: [phone]   Fax:  [phone]

## 2014-09-30 ENCOUNTER — Telehealth: Payer: Self-pay | Admitting: Cardiology

## 2014-09-30 ENCOUNTER — Ambulatory Visit: Payer: BLUE CROSS/BLUE SHIELD

## 2014-09-30 DIAGNOSIS — M25562 Pain in left knee: Secondary | ICD-10-CM

## 2014-09-30 DIAGNOSIS — M6289 Other specified disorders of muscle: Secondary | ICD-10-CM | POA: Diagnosis not present

## 2014-09-30 DIAGNOSIS — R29898 Other symptoms and signs involving the musculoskeletal system: Secondary | ICD-10-CM

## 2014-09-30 NOTE — Therapy (Signed)
[facility] [address] [place], [place], [address] Phone: [phone]   Fax:  [phone]  Physical Therapy Treatment  Patient Details  Name: Veronica Valencia MRN: [id number] Date of Birth: March 10, 1955 Referring Provider:  Lucretia Kern, DO  Encounter Date: 09/30/2014      PT End of Session - 09/30/14 1519    Visit Number 8   PT Start Time 9295   PT Stop Time 1519   PT Time Calculation (min) 27 min   Activity Tolerance Patient tolerated treatment well   Behavior During Therapy [facility] for tasks assessed/performed      Past Medical History  Diagnosis Date  . Hot flashes   . Episcleritis of right eye   . Paroxysmal SVT (supraventricular tachycardia) 10/06/2012    This may have simply been misdiagnosed A. fib  . PAF (paroxysmal atrial fibrillation) July 2014  . Paroxysmal atrial flutter August 2014  . H/O echocardiogram OS 20/40    EF 60-65% with mild concentric LVH. Normal wall motion. Grade 1 diastolic dysfunction. Trace aortic regurgitation. No clear-cut evidence of murmur source  . Abnormal exercise tolerance test 11/24/2012    Did not reach 85% maximum heart rate. No ischemic changes noted at 79%. Notably beta blocker was not held; no arrhythmias noted.    Past Surgical History  Procedure Laterality Date  . Varicose vein surgery  1990  . Cesarean section      There were no vitals filed for this visit.  Visit Diagnosis:  Knee pain, left  Weakness of both hips      Subjective Assessment - 09/30/14 1455    Subjective ready for D/C.  Going to [place] tomorrow for 2 weeks.     Currently in Pain? No/denies            [facility] PT Assessment - 09/30/14 0001    Assessment   Medical Diagnosis M25.562-Lateral Knee Pain, Left   Onset Date/Surgical Date 08/30/13   Observation/Other Assessments   Focus on Therapeutic Outcomes (FOTO)  35% limitation   Strength   Overall Strength Comments Bil hip flexion 4+/5, Lt  knee flexion 5/5, extension 4+/5                     [facility] Adult PT Treatment/Exercise - 09/30/14 0001    Knee/Hip Exercises: Stretches   Active Hamstring Stretch Left;20 seconds;1 rep  Hamstring, adductor stretch with green band   Piriformis Stretch 3 reps;20 seconds   Knee/Hip Exercises: Supine   Other Supine Knee/Hip Exercises Bridging 3x10  with red theraband    Knee/Hip Exercises: Sidelying   Clams 2x7; red theraband   Iontophoresis   Type of Iontophoresis Dexamethasone   Location post medial knee   Dose 1 ml   Time 6 hour wear                PT Education - 09/30/14 1508    Education provided Yes   Education Details ionto, seated hamstring stretch   Person(s) Educated Patient   Methods Explanation;Handout   Comprehension Verbalized understanding;Returned demonstration          PT Short Term Goals - 09/28/14 1606    PT SHORT TERM GOAL #1   Title Will be independent with HEP    Time 4   PT SHORT TERM GOAL #2   Title Will report 40% less Lt. knee pain after 10 minutes of ambulation   Reports 80% improvement with walking 3 miles  Time 4   Period Weeks   Status Achieved           PT Long Term Goals - 09/30/14 1500    PT LONG TERM GOAL #1   Title Will be independent in advanced HEP    Status Achieved   PT LONG TERM GOAL #2   Title Will report ambulating 30 minutes before pain onset in Lt. knee    Status Achieved   PT LONG TERM GOAL #3   Title Reduce FOTO limitation to < or = to 40%    Status Achieved  35% limitation   PT LONG TERM GOAL #4   Title Will report driving for 30 minutes with 2/10 in Lt. knee    Status Achieved  no pain with driving               Plan - 09/30/14 1503    Clinical Impression Statement Pt with 90% overall pain reduction and all goals have been met.  Pt will continue with HEP for continued gains.  Pt will be leaving for [place] for 2 weeks.    PT Next Visit Plan D/C PT   Consulted and Agree with Plan  of Care Patient        Problem List Patient Active Problem List   Diagnosis Date Noted  . Exertional dyspnea - with Beta Blockers 01/06/2014  . Chronotropic incompetence with beta blockers 01/06/2014    Class: Question of  . Fatigue 06/18/2013  . Chest pain with low risk for cardiac etiology 11/30/2012  . Paroxysmal SVT (supraventricular tachycardia) 10/06/2012  . Atrial flutter 10/06/2012    Class: Diagnosis of  . Aortic valve sclerosis 10/06/2012  . Paroxysmal atrial fibrillation /flutter 09/19/2012  . Hot flashes 08/28/2012  . FH: breast cancer in first degree relative 08/28/2012  . Scleritis and episcleritis of right eye 08/28/2012   PHYSICAL THERAPY DISCHARGE SUMMARY  Visits from Start of Care: 8  Current functional level related to goals / functional outcomes: See above for current goals status.  All goals met.   Remaining deficits: Mild Lt knee pain at times.  Pt denies any deficits.  Thank you for this referral.     Education / Equipment: HEP Plan: Patient agrees to discharge.  Patient goals were met. Patient is being discharged due to meeting the stated rehab goals.  ?????     TAKACS,KELLY, PT 09/30/2014, 3:20 PM  [facility] [address], [place], [place], [address] Phone: [phone]   Fax:  [phone]

## 2014-09-30 NOTE — Telephone Encounter (Signed)
Spoke with pt and Pharmacy, letting them know that there is refills for this medication

## 2014-09-30 NOTE — Telephone Encounter (Signed)
°  1. Which medications need to be refilled? Diltiazem   2. Which pharmacy is medication to be sent to?[facility] on [address]  3. Do they need a 30 day or 90 day supply? 30  4. Would they like a call back once the medication has been sent to the pharmacy? Yes

## 2014-09-30 NOTE — Patient Instructions (Signed)
HIP: Hamstrings - Short Sitting   Rest leg on raised surface. Keep knee straight. Lift chest. Hold __20_ seconds. __3_ reps per set, _3__ sets per day, ___ days per week   IONTOPHORESIS PATIENT PRECAUTIONS & CONTRAINDICATIONS:  . Redness under one or both electrodes can occur.  This characterized by a uniform redness that usually disappears within 12 hours of treatment. . Small pinhead size blisters may result in response to the drug.  Contact your physician if the problem persists more than 24 hours. . On rare occasions, iontophoresis therapy can result in temporary skin reactions such as rash, inflammation, irritation or burns.  The skin reactions may be the result of individual sensitivity to the ionic solution used, the condition of the skin at the start of treatment, reaction to the materials in the electrodes, allergies or sensitivity to dexamethasone, or a poor connection between the patch and your skin.  Discontinue using iontophoresis if you have any of these reactions and report to your therapist. . Remove the Patch or electrodes if you have any undue sensation of pain or burning during the treatment and report discomfort to your therapist. . Tell your Therapist if you have had known adverse reactions to the application of electrical current. . If using the Patch, the LED light will turn off when treatment is complete and the patch can be removed.  Approximate treatment time is 1-3 hours.  Remove the patch when light goes off or after 6 hours. . The Patch can be worn during normal activity, however excessive motion where the electrodes have been placed can cause poor contact between the skin and the electrode or uneven electrical current resulting in greater risk of skin irritation. Marland Kitchen Keep out of the reach of children.   . DO NOT use if you have a cardiac pacemaker or any other electrically sensitive implanted device. . DO NOT use if you have a known sensitivity to  dexamethasone. . DO NOT use during Magnetic Resonance Imaging (MRI). . DO NOT use over broken or compromised skin (e.g. sunburn, cuts, or acne) due to the increased risk of skin reaction. . DO NOT SHAVE over the area to be treated:  To establish good contact between the Patch and the skin, excessive hair may be clipped. . DO NOT place the Patch or electrodes on or over your eyes, directly over your heart, or brain. DO NOT reuse the Patch or electrodes as this may cause burns to occur. [facility] [address] [place],  [address] Phone # [phone] Fax [phone].

## 2014-10-05 NOTE — Telephone Encounter (Signed)
Can this encounter be closed?

## 2014-10-16 IMAGING — CR DG CHEST 2V
2 series · 2 of 2 positions shown · non-contrast
Comparison: None

CLINICAL DATA: Shortness of breath, palpitations.

CHEST - 2 VIEW

[w chest pa]
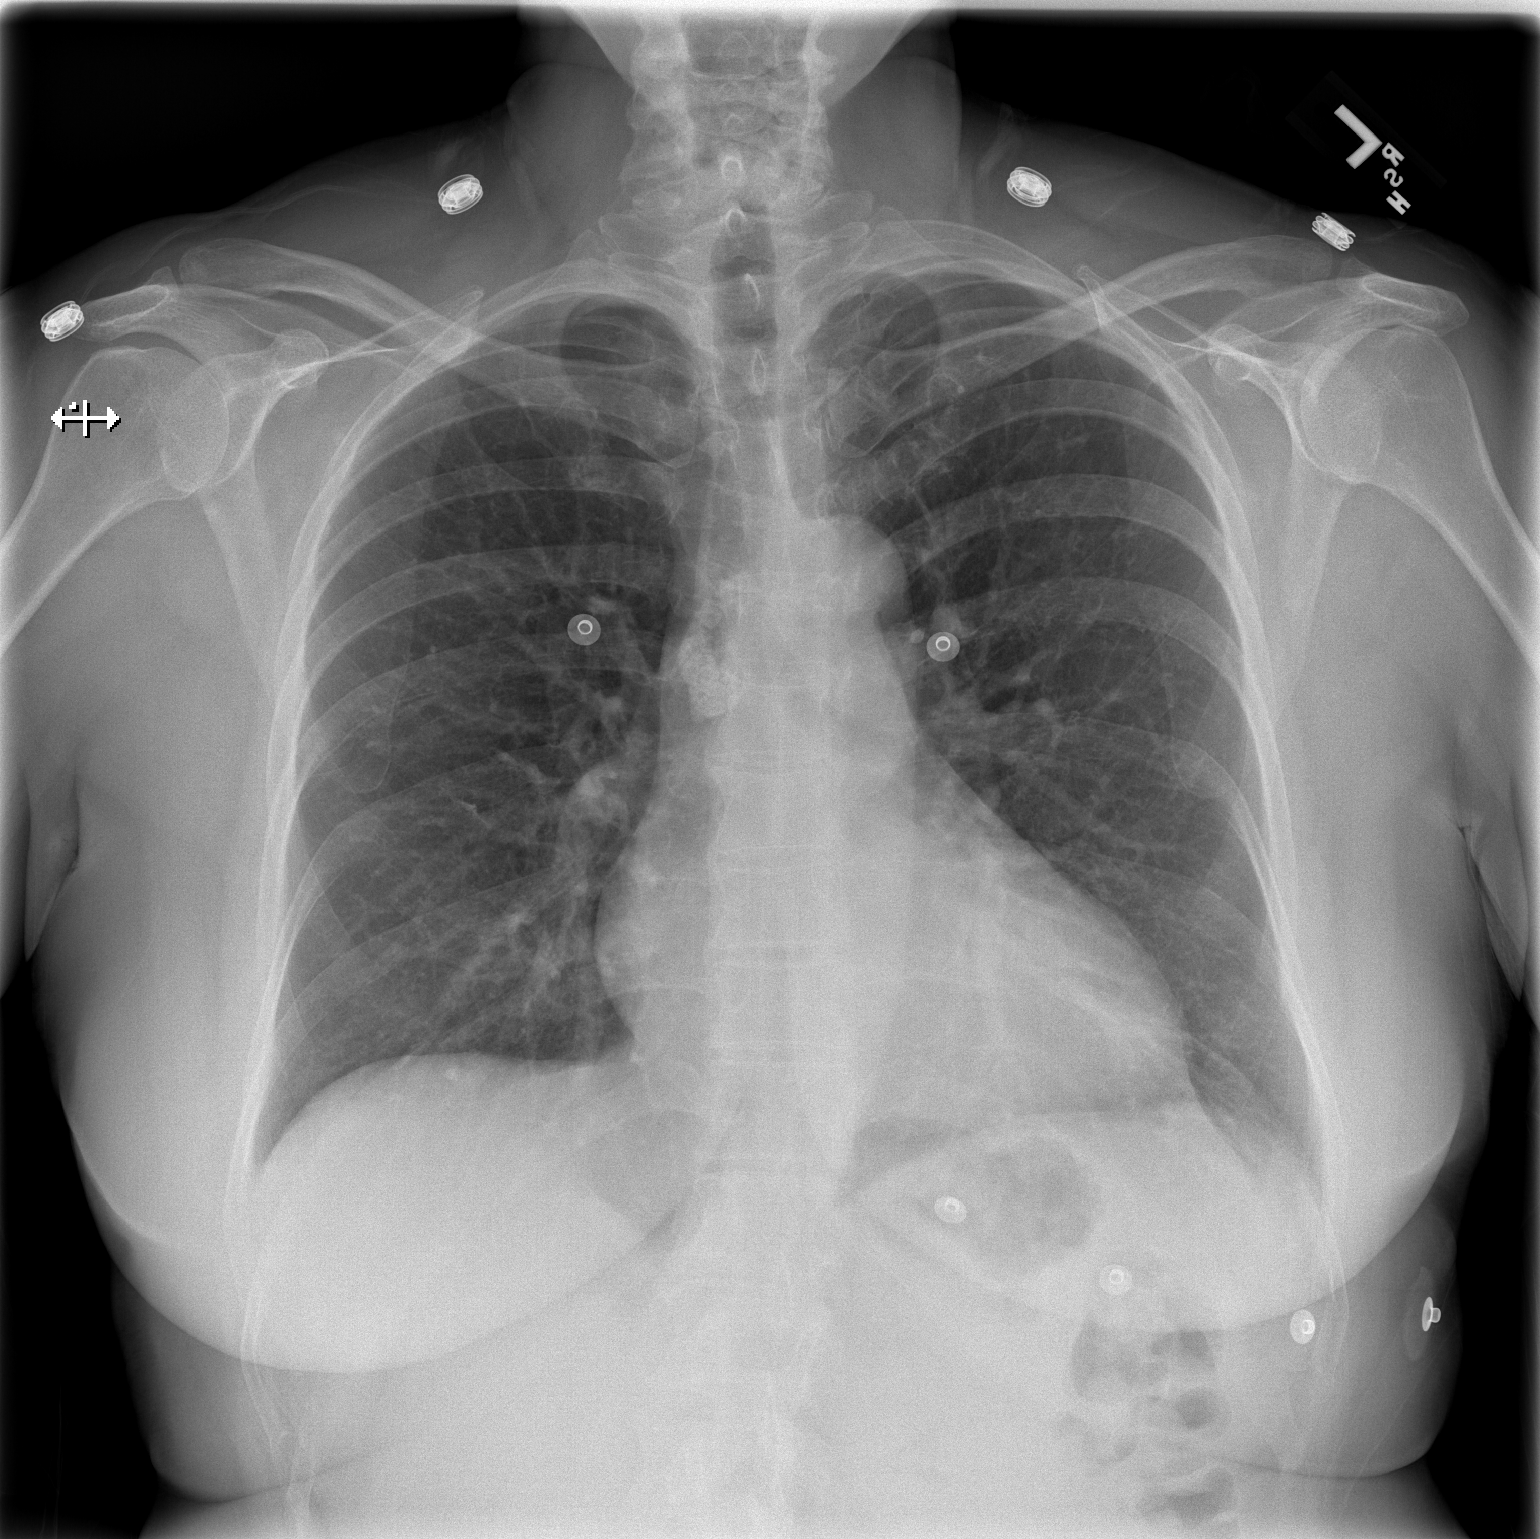

[w chest lat]
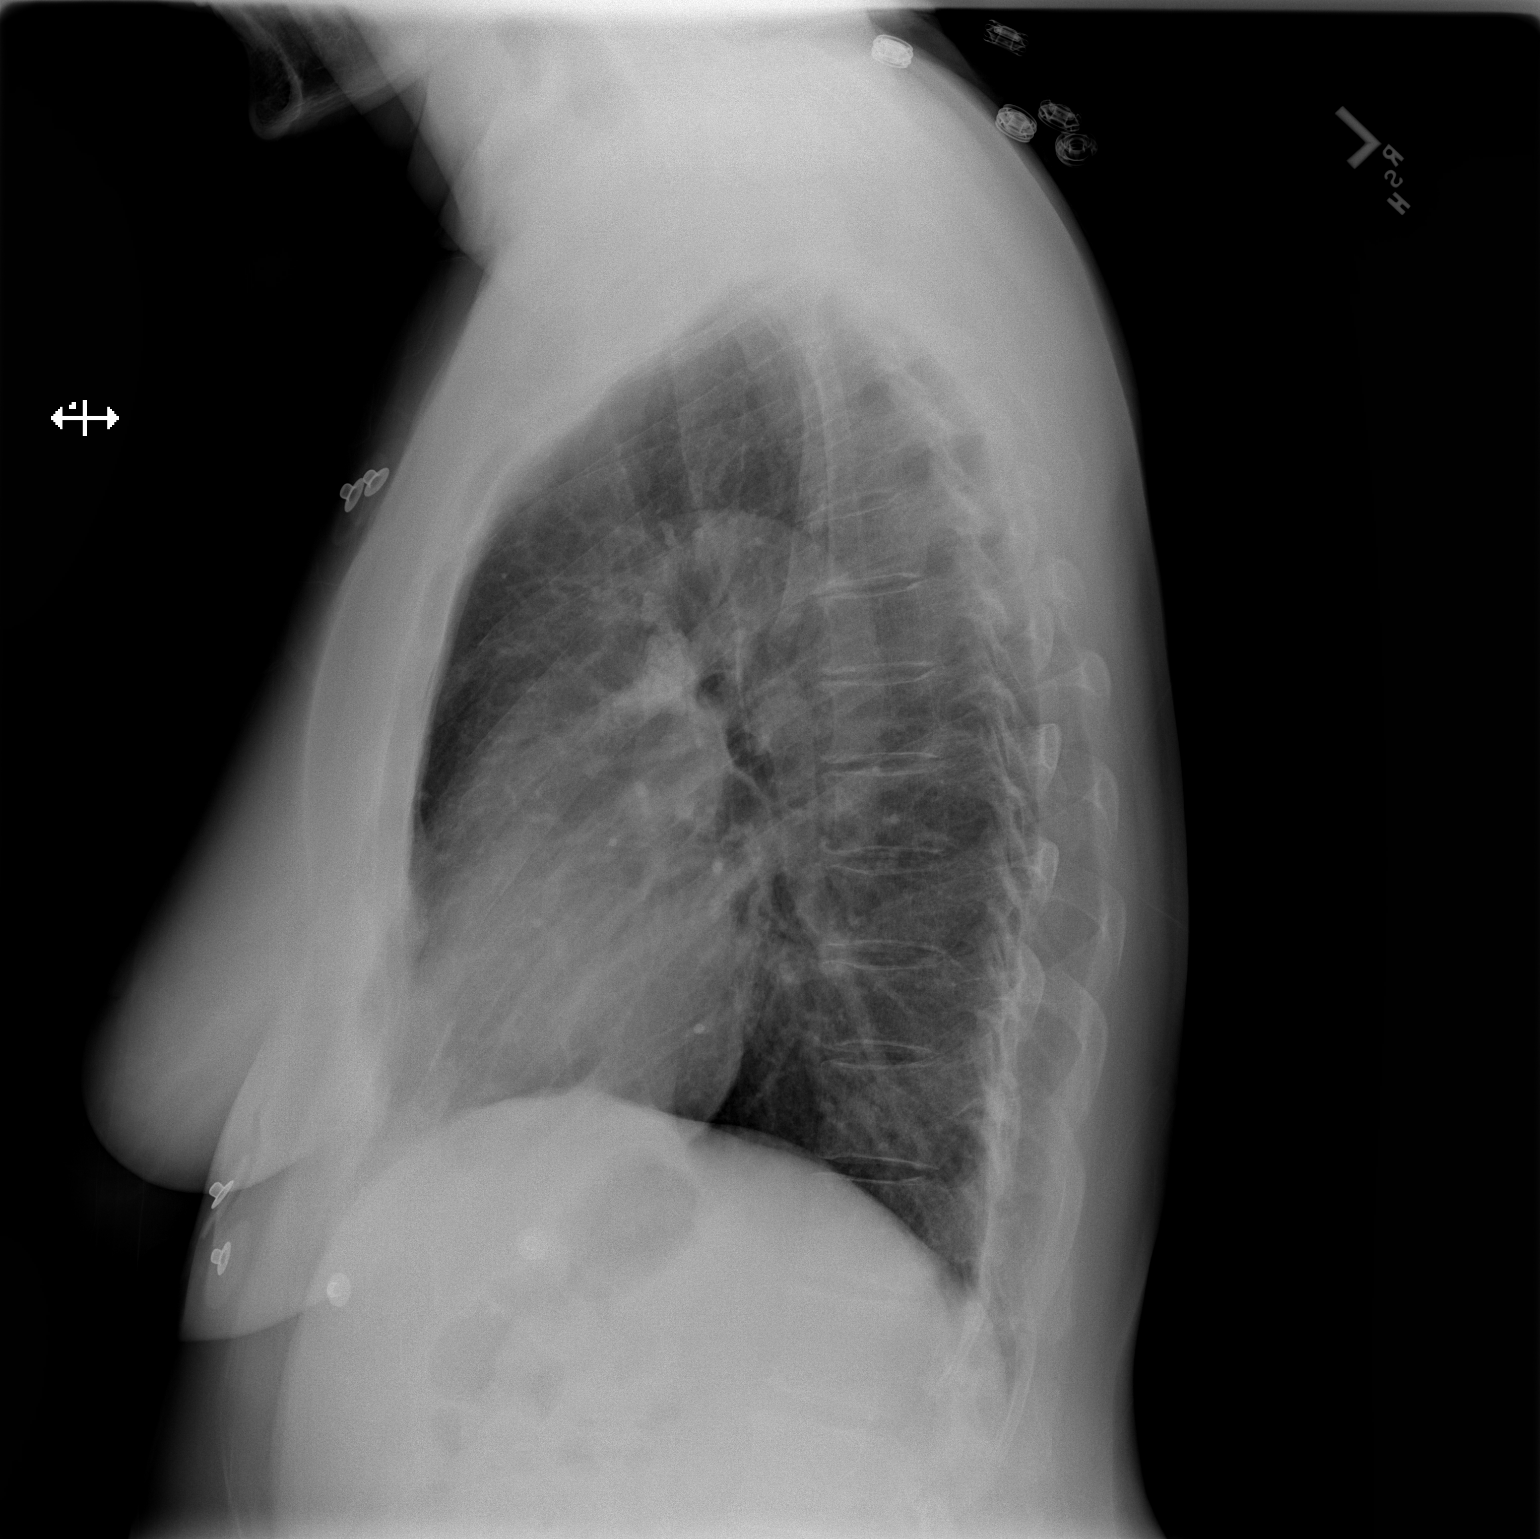

[2 of 2 positions shown; findings below may reference images not displayed]

FINDINGS: Heart is upper limits normal in size.  Lungs are clear.
No effusions or edema.  No acute bony abnormality.
IMPRESSION: No acute cardiopulmonary disease.

## 2014-11-09 ENCOUNTER — Telehealth: Payer: Self-pay | Admitting: Cardiology

## 2014-11-09 NOTE — Telephone Encounter (Signed)
Sounds like things are under control with HR now -- if this occurs & no relief from additional Diltiazem dose -- take 3 additional Flecainide x 1, then increase to 2 tabs for next scheduled dose.  Leonie Man, MD

## 2014-11-09 NOTE — Telephone Encounter (Signed)
Episode of HR of 160 during the night.  Attempted to bear down, HR would decrease and speed up again.  Took double dose of Cardizem  (60 mg X 2) Now heart rate is back down   Heart rate started to elevate on Sunday  Has some soreness left side of chest and between scapula especially with deep breath  Returned from [place] recently and had a 2 hour/1 hour flight to [place] this weekend  Will call her back if there is a change in her plan of care by either Dr. Ellyn Hack or Dr. Gwenlyn Found DOD.

## 2014-11-09 NOTE — Telephone Encounter (Signed)
Veronica Valencia is calling because she is having some discomfort , in chest and heart rate has been a little fast . Please call  Thanks

## 2014-11-10 NOTE — Telephone Encounter (Signed)
LVMTCB regarding Dr. Allison Quarry medication order for heart rate not responsive to Caredizem double dose for next time it occurs

## 2014-11-10 NOTE — Telephone Encounter (Signed)
LMTCB

## 2014-11-10 NOTE — Telephone Encounter (Signed)
Dr. Allison Quarry instructions for additional 3 Flecainide  X 1 followed by 2 tabs for next scheduled dose if this reoccurs and not responsive to additional Diltiazem dose

## 2014-11-19 ENCOUNTER — Encounter: Payer: Self-pay | Admitting: Cardiology

## 2014-11-19 ENCOUNTER — Telehealth: Payer: Self-pay | Admitting: *Deleted

## 2014-11-19 ENCOUNTER — Ambulatory Visit (INDEPENDENT_AMBULATORY_CARE_PROVIDER_SITE_OTHER): Payer: BLUE CROSS/BLUE SHIELD | Admitting: Cardiology

## 2014-11-19 VITALS — BP 146/88 | HR 107 | Ht 63.0 in | Wt 185.0 lb

## 2014-11-19 DIAGNOSIS — R079 Chest pain, unspecified: Secondary | ICD-10-CM

## 2014-11-19 DIAGNOSIS — I471 Supraventricular tachycardia, unspecified: Secondary | ICD-10-CM

## 2014-11-19 DIAGNOSIS — Z01818 Encounter for other preprocedural examination: Secondary | ICD-10-CM | POA: Diagnosis not present

## 2014-11-19 DIAGNOSIS — I480 Paroxysmal atrial fibrillation: Secondary | ICD-10-CM | POA: Diagnosis not present

## 2014-11-19 LAB — BASIC METABOLIC PANEL
BUN: 14 mg/dL (ref 7–25)
CO2: 26 mmol/L (ref 20–31)
Calcium: 9.4 mg/dL (ref 8.6–10.4)
Chloride: 100 mmol/L (ref 98–110)
Creat: 0.62 mg/dL (ref 0.50–1.05)
Glucose, Bld: 84 mg/dL (ref 65–99)
POTASSIUM: 5.1 mmol/L (ref 3.5–5.3)
SODIUM: 138 mmol/L (ref 135–146)

## 2014-11-19 LAB — CBC
HEMATOCRIT: 42 % (ref 36.0–46.0)
HEMOGLOBIN: 14.4 g/dL (ref 12.0–15.0)
MCH: 33.7 pg (ref 26.0–34.0)
MCHC: 34.3 g/dL (ref 30.0–36.0)
MCV: 98.4 fL (ref 78.0–100.0)
MPV: 8.7 fL (ref 8.6–12.4)
Platelets: 364 10*3/uL (ref 150–400)
RBC: 4.27 MIL/uL (ref 3.87–5.11)
RDW: 12.3 % (ref 11.5–15.5)
WBC: 10.8 10*3/uL — AB (ref 4.0–10.5)

## 2014-11-19 MED ORDER — RIVAROXABAN 20 MG PO TABS: 20.0000 mg | ORAL_TABLET | Freq: Every day | ORAL | Status: DC

## 2014-11-19 MED ORDER — FLECAINIDE ACETATE 100 MG PO TABS: 100.0000 mg | ORAL_TABLET | Freq: Two times a day (BID) | ORAL | Status: DC

## 2014-11-19 NOTE — Telephone Encounter (Signed)
Late entry  Per scheduler ,Zigmund Daniel states patient unable to do cardioversion next week. Scheduler will contact patient and outpatient on Monday to finalize.  Dr Ellyn Hack aware.  Per Dr Ellyn Hack, if unable to do cardioversion ,start flecainide 100 mg mg twice a day and have patient to come in on Thursday 11/25/14 for nurse visit -EKG.

## 2014-11-19 NOTE — Progress Notes (Signed)
PCP: Lucretia Kern., DO  Clinic Note: Chief Complaint  Patient presents with  . Follow-up  . Chest Pain    Labor day patient had chest pain and irregular heart beats. Took a Flecainide and she stated that she felt better after that.  . Atrial Fibrillation  . Palpitations    HPI: Veronica Valencia is a 59 y.o. female with a PMH below who presents today for f/u of evaluation of recurrent Afib- rVR.  Veronica Valencia was last seen on March 17th -- was doing well.  HR  & rhythm was well controlled.  Recent Hospitalizations: N/a  Studies Reviewed: n/a  Interval History: Veronica Valencia returns to day with really the main complaint of having recurrence of her A. Fib over the last couple weeks. She had been doing very well, she returned from her trip to [place] (able to walk all over the high altitude [place],  Oceanographer etc  With tons of pictures). She went on a trip with her husband, daughter son-in-law and his parents. They have a great time she was able to walk over place without any symptoms of chest tightness pressure or dyspnea. Her weight stayed stable. She has no complaints of fatigue or exercise intolerance. Then shortly after getting back she went with her daughter help her move and during that she started noticing that her heart was beating irregular and somewhat fast. It has been off-and-on now but mostly on for the last couple weeks. The last 3 or 4 days has felt worse with a strong feeling more tired and maybe even has some chest discomfort. She denies any syncope near syncope or TIA/amaurosis fugax. No PND or orthopnea or edema.  She is not sure what was happening when they came on her dating was different. She may have had more caffeine but she is just not sure. She has not had any GI symptoms and no other illnesses.  No melena, hematochezia, hematuria, or epstaxis.  Past Medical History  Diagnosis Date  . Hot flashes   . Episcleritis of right eye   . Paroxysmal SVT (supraventricular  tachycardia) 10/06/2012    This may have simply been misdiagnosed A. fib  . PAF (paroxysmal atrial fibrillation) July 2014  . Paroxysmal atrial flutter August 2014  . H/O echocardiogram OS 20/40    EF 60-65% with mild concentric LVH. Normal wall motion. Grade 1 diastolic dysfunction. Trace aortic regurgitation. No clear-cut evidence of murmur source  . Abnormal exercise tolerance test 11/24/2012    Did not reach 85% maximum heart rate. No ischemic changes noted at 79%. Notably beta blocker was not held; no arrhythmias noted.    Past Surgical History  Procedure Laterality Date  . Varicose vein surgery  1990  . Cesarean section     ROS: A comprehensive was performed. Review of Systems  Constitutional: Positive for malaise/fatigue (With heart rate up).  Respiratory: Positive for shortness of breath (Associated with rapid heart rate).   Cardiovascular: Negative for claudication and leg swelling.  Gastrointestinal: Negative for blood in stool and melena.  Genitourinary: Negative for hematuria.  Musculoskeletal: Negative for myalgias and joint pain.  Neurological: Negative for dizziness, tremors and headaches.  Endo/Heme/Allergies: Does not bruise/bleed easily.  Psychiatric/Behavioral: Negative for depression. The patient is not nervous/anxious.   All other systems reviewed and are negative.   Prior to Admission medications   Medication Sig Start Date End Date Taking? Authorizing Provider  aspirin EC 81 MG tablet Take 81 mg by mouth daily.  Yes Historical Provider, MD  diltiazem (CARDIZEM SR) 60 MG 12 hr capsule Take 1 capsule (60 mg total) by mouth 2 (two) times daily. 05/20/14  Yes Leonie Man, MD  flecainide (TAMBOCOR) 50 MG tablet Take 1 tablet (50 mg total) by mouth 2 (two) times daily. 01/04/14  Yes Leonie Man, MD  Ibuprofen (ADVIL PO) Take by mouth as needed (takes three tablets as needed for knee pain).   Yes Historical Provider, MD  JINTELI 1-5 MG-MCG TABS TAKE 1 TABLET BY  MOUTH EVERY MORNING. 03/12/14  Yes Lucretia Kern, DO   No Known Allergies   Social History   Social History  . Marital Status: Married    Spouse Name: N/A  . Number of Children: N/A  . Years of Education: N/A   Social History Main Topics  . Smoking status: Never Smoker   . Smokeless tobacco: Never Used  . Alcohol Use: Yes     Comment: couple of glasses of wine daily   . Drug Use: No  . Sexual Activity: Not Asked   Other Topics Concern  . 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   Family History  Problem Relation Age of Onset  . Diabetes Mother   . Hypertension Mother   . Stroke Father   . Hypertension Father   . Cancer Sister 45    breast  . Heart attack Father 59     Wt Readings from Last 3 Encounters:  11/19/14 185 lb (83.915 kg)  05/20/14 186 lb (84.369 kg)  05/07/14 183 lb (83.008 kg)    PHYSICAL EXAM BP 146/88 mmHg  Pulse 107  Ht 5\' 3"  (1.6 m)  Wt 185 lb (83.915 kg)  BMI 32.78 kg/m2 General appearance: alert, cooperative, appears stated age, no distress and mildly obese HEENT: [place]/AT, EOMI, MMM, anicteric sclera Neck: no adenopathy, no carotid bruit and no JVD HEENT: [place]/AT, EOMI, MMM, anicteric sclera Lungs: CTAB, normal percussion bilaterally and non-labored Heart: Rapid/Irregular Rate/Rtythm;  S1, S2 normal, no murmur, click, rub or gallop; nondisplaced PMI Abdomen: soft, non-tender; bowel sounds normal; no masses, no organomegaly; Extremities: extremities normal, atraumatic, no cyanosis, and no edema; Pulses: 2+ and symmetric;  Neurologic: Mental status: Alert, oriented, thought content appropriate; Cranial nerves: II-XII grossly intact Psych: normal/pleasant mood and affect.    Adult ECG Report  Rate: 107 ;   Rhythm: atrial fibrillation with RVR. Axis -19 (has appearance of atypical flutter, but irregular)  Narrative Interpretation: Afib RVR replaced NSR   Other studies Reviewed: Additional studies/ records that were reviewed today include:  Recent Labs:  none   ASSESSMENT / PLAN: Problem List Items Addressed This Visit    Chest pain with low risk for cardiac etiology - Primary    I think she can do stairs with a heart rate going fast. This may very well actually been related to her heart as far as having some mild chest pain. Nothing significant.      Relevant Orders   EKG 12-Lead   ELECTRICAL CARDIOVERSION   ECHO TEE   Basic metabolic panel (Completed)   CBC (Completed)   Paroxysmal atrial fibrillation /flutter (Chronic)    She is now having a breakthrough on  low-dose flecainide. I would like to try to get her out of A. Fib since she is symptomatic with it. I thought about her arm we discussed different options. For now I would like to do is go ahead and pretreat her with Xarelto and increased her p.m. Dose of diltiazem to 120 mg until we are able to have her undergo TEE and cardioversion.  We will try to schedule cardioversion next Tuesday. After that I would like her to increase her flecainide 100 mg twice a day and return to her stable dose of diltiazem.  In the future she'll use a pill in the pocket technique of 300 mg  Flecainide x1 for breakthrough.  If we are not able to get a cardioversion done, what I would like to his baseline EKG to see she still in A. Fib. We can then consider continuing Xarelto four-month and reassess if she still needs cardioversion.  If we did that plan I would go ahead and move forward with the increased dose of flecainide with a one-time 300 mg dosing if she remains in A. Fib next week.      Relevant Medications   flecainide (TAMBOCOR) 100 MG tablet   rivaroxaban (XARELTO) 20 MG TABS tablet   rivaroxaban (XARELTO) 20 MG TABS tablet   Other Relevant  Orders   EKG 12-Lead   ELECTRICAL CARDIOVERSION   ECHO TEE   Basic metabolic panel (Completed)   CBC (Completed)   Paroxysmal SVT (supraventricular tachycardia) (Chronic)   Relevant Medications   flecainide (TAMBOCOR) 100 MG tablet   rivaroxaban (XARELTO) 20 MG TABS tablet   rivaroxaban (XARELTO) 20 MG TABS tablet   Other Relevant Orders   EKG 12-Lead   ELECTRICAL CARDIOVERSION   ECHO TEE   Basic metabolic panel (Completed)   CBC (Completed)    Other Visit Diagnoses    Pre-op testing        Relevant Orders    Basic metabolic panel (Completed)    CBC (Completed)       Current medicines are reviewed at length with the patient today. (+/- concerns) none The following changes have been made:  TAKE AN EXTRA DOSE OF DILTIAZEM (TOTAL OF 120 MG) AT NIGHT UNTIL CARDIOVERSION  START TAKING  XARELTO  EVERYDAY WITH YOUR HEAVIES MEAL. CONTINUE FOR A MONTH AFTER THE CARDIOVERSION.  Your physician has recommended that you have a Cardioversion ([place] TEE on Tuesday 11/23/2014.   Increase flecainide to 100 mg  Twice a day after cardioversion.  If you have an episode after cardioversion - take an extra one time dose of 300 mg flecainide  And extra dose of Diltiazem as well.   Studies Ordered:   Orders Placed This Encounter  Procedures  . ELECTRICAL CARDIOVERSION  . Basic metabolic panel  . CBC  . EKG 12-Lead  . ECHO TEE    ROV 1 month  HARDING, Leonie Green, M.D., M.S. Interventional Cardiologist   Pager # [phone]

## 2014-11-19 NOTE — Patient Instructions (Signed)
TAKE AN EXTRA DOSE OF DILTIAZEM (TOTAL OF 100 MG) AT NIGHT UNTIL CARDIOVERSION  START TAKING  XARELTO  EVERYDAY WITH YOUR HEAVIES MEAL. CONTINUE FOR A MONTH AFTER THE CARDIOVERSION.  Your physician has recommended that you have a Cardioversion ([place] TEE on Tuesday 11/23/2014. Electrical Cardioversion uses a jolt of electricity to your heart either through paddles or wired patches attached to your chest. This is a controlled, usually prescheduled, procedure. Defibrillation is done under light anesthesia in the hospital, and you usually go home the day of the procedure. This is done to get your heart back into a normal rhythm. You are not awake for the procedure. Please see the instruction sheet given to you today.  Increase flecainide to 100 mg  Twice a day after cardioversion.  If you have an episode after cardioversion - take an extra one time dose of 300 mg flecainide  And extra dose of Diltiazem as well.  Your physician recommends that you schedule a follow-up appointment in 1 MONTH DR HARDING-30 MIN.

## 2014-11-21 ENCOUNTER — Encounter: Payer: Self-pay | Admitting: Cardiology

## 2014-11-21 NOTE — Assessment & Plan Note (Addendum)
She is now having a breakthrough on low-dose flecainide. I would like to try to get her out of A. Fib since she is symptomatic with it. I thought about her arm we discussed different options. For now I would like to do is go ahead and pretreat her with Xarelto and increased her p.m. Dose of diltiazem to 120 mg until we are able to have her undergo TEE and cardioversion.  We will try to schedule cardioversion next Tuesday. After that I would like her to increase her flecainide 100 mg twice a day and return to her stable dose of diltiazem.  In the future she'll use a pill in the pocket technique of 300 mg  Flecainide x1 for breakthrough.  If we are not able to get a cardioversion done, what I would like to his baseline EKG to see she still in A. Fib. We can then consider continuing Xarelto four-month and reassess if she still needs cardioversion.  If we did that plan I would go ahead and move forward with the increased dose of flecainide with a one-time 300 mg dosing if she remains in A. Fib next week.

## 2014-11-21 NOTE — Assessment & Plan Note (Signed)
I think she can do stairs with a heart rate going fast. This may very well actually been related to her heart as far as having some mild chest pain. Nothing significant.

## 2014-11-23 ENCOUNTER — Telehealth: Payer: Self-pay | Admitting: *Deleted

## 2014-11-23 NOTE — Telephone Encounter (Signed)
-----   Message from Veronica Valencia sent at 11/22/2014  8:14 AM EDT ----- Regarding: TEE Cardioversion I spoke with patient @ 8:12 am today 11/22/14.  TEE Cardioverion is rescheduled for Friday 11/26/14 @ 12 noon with Dr. Stanford Breed.  Patient aware to arrive at short stay at 11:00 am---NPO after midnight.

## 2014-11-23 NOTE — Telephone Encounter (Signed)
Will forward to Dr Ellyn Hack -for his information

## 2014-11-23 NOTE — Telephone Encounter (Signed)
Sounds good - how is she doing?  Leonie Man, MD

## 2014-11-26 ENCOUNTER — Encounter (HOSPITAL_COMMUNITY)
Admission: RE | Disposition: A | Discharge status: Home / Self Care | Payer: Self-pay | Source: Ambulatory Visit | Attending: Cardiology

## 2014-11-26 ENCOUNTER — Ambulatory Visit (HOSPITAL_COMMUNITY)
Admission: RE | Admit: 2014-11-26 | Discharge: 2014-11-26 | Disposition: A | Discharge status: Home / Self Care | Payer: BLUE CROSS/BLUE SHIELD | Source: Ambulatory Visit | Attending: Cardiology | Admitting: Cardiology

## 2014-11-26 DIAGNOSIS — Z5309 Procedure and treatment not carried out because of other contraindication: Secondary | ICD-10-CM | POA: Insufficient documentation

## 2014-11-26 DIAGNOSIS — I4891 Unspecified atrial fibrillation: Secondary | ICD-10-CM | POA: Diagnosis present

## 2014-11-26 SURGERY — CANCELLED PROCEDURE

## 2014-11-26 NOTE — Progress Notes (Signed)
Pt's procedure cancelled, pt not in atrial fibrillation, Dr. Stanford Breed aware and spoke with pt/brt, rn

## 2014-11-26 NOTE — Progress Notes (Signed)
Patient presented for TEE/DCCV but was in sinus; procedure cancelled; continue present meds Kirk Ruths

## 2014-12-20 ENCOUNTER — Ambulatory Visit: Payer: BLUE CROSS/BLUE SHIELD | Admitting: Cardiology

## 2014-12-28 ENCOUNTER — Encounter: Payer: Self-pay | Admitting: Cardiology

## 2014-12-28 ENCOUNTER — Ambulatory Visit (INDEPENDENT_AMBULATORY_CARE_PROVIDER_SITE_OTHER): Payer: BLUE CROSS/BLUE SHIELD | Admitting: Cardiology

## 2014-12-28 VITALS — BP 130/82 | HR 67 | Ht 63.0 in | Wt 184.9 lb

## 2014-12-28 DIAGNOSIS — R079 Chest pain, unspecified: Secondary | ICD-10-CM | POA: Diagnosis not present

## 2014-12-28 DIAGNOSIS — I4589 Other specified conduction disorders: Secondary | ICD-10-CM

## 2014-12-28 DIAGNOSIS — I471 Supraventricular tachycardia: Secondary | ICD-10-CM

## 2014-12-28 DIAGNOSIS — I480 Paroxysmal atrial fibrillation: Secondary | ICD-10-CM | POA: Diagnosis not present

## 2014-12-28 NOTE — Assessment & Plan Note (Signed)
It would appear that with increased rate control, she spontaneously converted to sinus rhythm. Okay to stop Xarelto as her CHA2DS2-VASc Score and unadjusted Ischemic Stroke Rate (% per year) is equal to 0.6 % stroke rate/year from a score of 1  Above score calculated as 1 point each if present [CHF, HTN, DM, Vascular=MI/PAD/Aortic Plaque, Age if 65-74, or Female] Above score calculated as 2 points each if present [Age > 75, or Stroke/TIA/TE]  Plan for now: Continue his flecainide 100 mg twice a day and 60 mg twice a day of diltiazem. The plan for when necessary milligrams flecainide plus additional diltiazem for breakthrough spells.

## 2014-12-28 NOTE — Patient Instructions (Signed)
RECOMMEND- IF YOU FEEL HEART RACING FAST ( AFIB) TAKE  AN EXTRA 300 MG FLECAINIDE AND 120 MG DILTIAZEM   IF YOU FEEL HEART RATE DIFFERENT -SLOW   TAKE AN EXTRA 300 MG FLECAINIDE AND 60 MG DILTIAZEM .  IF RATE IS STILL THERE AFTER 6 HOURS FOR BOTH  YOU MAY TAKE ANOTHER 300 MG FLECAINIDE. IF SYMPTOMS STILL OCCUR AFTER ANOTHER 6 HOURS CONTACT OFFICE.  IF SYMPTOMS INTOLERABLE GO TO HOSPITAL.  NO OTHER CHANGES.   Your physician wants you to follow-up in [place]- 30 MIN. You will receive a reminder letter in the mail two months in advance. If you don't receive a letter, please call our office to schedule the follow-up appointment.  If you need a refill on your cardiac medications before your next appointment, please call your pharmacy.

## 2014-12-28 NOTE — Assessment & Plan Note (Signed)
Much better with diltiazem even in the presence of a combined

## 2014-12-28 NOTE — Progress Notes (Signed)
PCP: Lucretia Kern., DO  Clinic Note: Chief Complaint  Patient presents with  . Follow-up    1 mo//pt c/o slight chest pain on the left side, just in the last 24 hours/feels her heart beating hard and wakes her up at night//pt states no other Sx.  . Atrial Fibrillation    HPI: Veronica Valencia is a 59 y.o. female with a PMH below who presents today for f/u of evaluation of recurrent Afib- rVR.  Veronica Valencia was last seen on Sept 16th  -- was back in [place] - plan was initiate anticoagulation with Xarelto & plan for with increased CCB dose (120 mg qhs until procedure) TEE DCCV.   When she presented to the hospital for her procedure, she was found to be in normal sinus rhythm, therefore the procedure was canceled.  Plan was then to increase Flecainide to 100 mg BID. EKD on 9/23 - back in NSR, rate 60.   Plan going forward: If you have an episode after cardioversion - take an extra one time dose of 300 mg flecainide  And extra dose of Diltiazem as well.  Recent Hospitalizations: N/a  Studies Reviewed: n/a  Interval History: Veronica Valencia is doing well since her last visit. Pretty much a she felt like she may have been back in regular rhythm on the day of for planned cardioversion system with what was actually found. Otherwise he is doing relatively well, she doesn't have rebound and say she can feel pulsations in her throat or posterior the heart beats in your thumb when lying down. Otherwise really has not had any further rapid irregular heartbeats. No lightheadedness or dizziness with rest or exertion. No syncopal or near syncopal episodes. Is not any bleeding issues while she is Xarelto. She takes either today or tomorrow is her last dose for the one-month of coverage..  Cardiovascular ROS: no chest pain or dyspnea on exertion negative for - orthopnea, palpitations, paroxysmal nocturnal dyspnea, rapid heart rate, shortness of breath or TIA/amaurosis fugax.   Past Medical History  Diagnosis Date   . Hot flashes   . Episcleritis of right eye   . Paroxysmal SVT (supraventricular tachycardia) ([place]) 10/06/2012    This may have simply been misdiagnosed A. fib  . PAF (paroxysmal atrial fibrillation) [facility]) July 2014  . Paroxysmal atrial flutter [facility]) August 2014  . H/O echocardiogram OS 20/40    EF 60-65% with mild concentric LVH. Normal wall motion. Grade 1 diastolic dysfunction. Trace aortic regurgitation. No clear-cut evidence of murmur source  . Abnormal exercise tolerance test 11/24/2012    Did not reach 85% maximum heart rate. No ischemic changes noted at 79%. Notably beta blocker was not held; no arrhythmias noted.    Past Surgical History  Procedure Laterality Date  . Varicose vein surgery  1990  . Cesarean section     ROS: A comprehensive was performed. Review of Systems  Constitutional: Negative for malaise/fatigue (No longer present with controlled heart rate/rhythm).  HENT: Negative for nosebleeds.   Respiratory: Negative for shortness of breath (Associated with rapid heart rate).   Cardiovascular: Negative for claudication and leg swelling.  Gastrointestinal: Negative for blood in stool and melena.  Genitourinary: Negative for hematuria.  Musculoskeletal: Negative for myalgias and joint pain.  Neurological: Negative for dizziness, tremors and headaches.  Endo/Heme/Allergies: Does not bruise/bleed easily.  Psychiatric/Behavioral: Negative for depression. The patient is not nervous/anxious.   All other systems reviewed and are negative.   Prior to Admission medications   Medication  Sig Start Date End Date Taking? Authorizing Provider  aspirin EC 81 MG tablet Take 81 mg by mouth daily.   Yes Historical Provider, MD  diltiazem (CARDIZEM SR) 60 MG 12 hr capsule Take 1 capsule (60 mg total) by mouth 2 (two) times daily. 05/20/14  Yes Leonie Man, MD  flecainide (TAMBOCOR) 50 MG tablet Take 1 tablet (50 mg total) by mouth 2 (two) times daily. 01/04/14  Yes Leonie Man,  MD  Ibuprofen (ADVIL PO) Take by mouth as needed (takes three tablets as needed for knee pain).   Yes Historical Provider, MD  JINTELI 1-5 MG-MCG TABS TAKE 1 TABLET BY MOUTH EVERY MORNING. 03/12/14  Yes Lucretia Kern, DO   No Known Allergies   Social History   Social History  . Marital Status: Married    Spouse Name: N/A  . Number of Children: N/A  . Years of Education: N/A   Social History Main Topics  . Smoking status: Never Smoker   . Smokeless tobacco: Never Used  . Alcohol Use: Yes     Comment: couple of glasses of wine daily   . Drug Use: No  . Sexual Activity: Not Asked   Other Topics Concern  . 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   Family History  Problem Relation Age of Onset  . Diabetes Mother   . Hypertension Mother   . Stroke Father   . Hypertension Father   . Cancer Sister 67    breast  . Heart attack Father 77    Wt Readings from Last 3 Encounters:  12/28/14 184 lb 14.4 oz (83.87 kg)  11/19/14 185 lb (83.915 kg)  05/20/14 186 lb (84.369 kg)    PHYSICAL EXAM BP 130/82 mmHg  Pulse 67  Ht 5\' 3"  (1.6 m)  Wt 184 lb 14.4 oz (83.87 kg)  BMI 32.76 kg/m2 General appearance: alert, cooperative, appears stated age, no distress and mildly obese HEENT: [place]/AT, EOMI, MMM, anicteric sclera Neck: no adenopathy, no carotid bruit and no JVD HEENT: [place]/AT, EOMI, MMM, anicteric sclera Lungs: CTAB, normal percussion bilaterally and non-labored Heart: RRR ;  S1, S2 normal, soft 1-2/6 SEM at RUSB; no click, rub or gallop; nondisplaced PMI Abdomen: soft, non-tender; bowel sounds normal; no masses, no organomegaly; Extremities: extremities normal, atraumatic, no cyanosis, and no edema; Pulses: 2+ and symmetric;  Neurologic: Mental  status: Alert, oriented, thought content appropriate; Cranial nerves: II-XII grossly intact Psych: normal/pleasant mood and affect.    Adult ECG Report  Rate: 67 ;  Rhythm: normal sinus rhythm and Normal axis, intervals and durations.   Narrative Interpretation: normal EKG. No longer in atrial fibrillation.   Other studies Reviewed: Additional studies/ records that were reviewed today include:  Recent Labs:  none   Chemistry      Component Value Date/Time   NA 138 11/19/2014 2320   K 5.1 11/19/2014 2320   CL 100 11/19/2014 2320   CO2 26 11/19/2014 2320   BUN 14 11/19/2014 2320   CREATININE 0.62 11/19/2014 2320   CREATININE 0.71 05/07/2014 1016      Component Value Date/Time   CALCIUM 9.4 11/19/2014 2320       ASSESSMENT / PLAN:  Problem List Items Addressed This Visit    Paroxysmal SVT (supraventricular tachycardia) ([place]) - Primary (Chronic)   Relevant Orders   EKG 12-Lead   Paroxysmal atrial fibrillation /flutter (Chronic)    It would appear that with increased rate control, she spontaneously converted to sinus rhythm. Okay to stop Xarelto as her CHA2DS2-VASc Score and unadjusted Ischemic Stroke Rate (% per year) is equal to 0.6 % stroke rate/year from a score of 1  Above score calculated as 1 point each if present [CHF, HTN, DM, Vascular=MI/PAD/Aortic Plaque, Age if 65-74, or Female] Above score calculated as 2 points each if present [Age > 75, or Stroke/TIA/TE]  Plan for now: Continue his flecainide 100 mg twice a day and 60 mg twice a day of diltiazem. The plan for when necessary milligrams flecainide plus additional diltiazem for breakthrough spells.       Relevant Orders   EKG 12-Lead   Chronotropic incompetence with beta blockers (Chronic)    Much better with diltiazem even in the presence of a combined      Chest pain with low risk for cardiac etiology    Related to her A. Fib. This initially went out of A. Fib, she denied any further symptoms. Has a  negative ischemic evaluation.      Relevant Orders   EKG 12-Lead      Current medicines are reviewed at length with the patient today. (+/- concerns) none The following changes have been made:  RECOMMEND- IF YOU FEEL HEART RACING FAST ( AFIB) TAKE  AN EXTRA 300 MG FLECAINIDE AND 120 MG DILTIAZEM   IF YOU FEEL HEART RATE DIFFERENT -SLOW   TAKE AN EXTRA 300 MG FLECAINIDE AND 60 MG DILTIAZEM .  IF RATE IS STILL THERE AFTER 6 HOURS FOR BOTH  YOU MAY TAKE ANOTHER 300 MG FLECAINIDE. IF SYMPTOMS STILL OCCUR AFTER ANOTHER 6 HOURS CONTACT OFFICE.  IF SYMPTOMS INTOLERABLE GO TO HOSPITAL.  NO OTHER CHANGES.  Follow-up 6 MONTHS WITH DR Ellyn Hack   Studies Ordered:   Orders Placed This Encounter  Procedures  . EKG 12-Lead    ROV 1 month  HARDING, Leonie Green, M.D., M.S. Interventional Cardiologist   Pager # [phone]

## 2014-12-28 NOTE — Assessment & Plan Note (Signed)
Related to her A. Fib. This initially went out of A. Fib, she denied any further symptoms. Has a negative ischemic evaluation.

## 2015-01-23 ENCOUNTER — Other Ambulatory Visit: Payer: Self-pay | Admitting: Cardiology

## 2015-01-31 ENCOUNTER — Other Ambulatory Visit: Payer: Self-pay | Admitting: *Deleted

## 2015-01-31 MED ORDER — DILTIAZEM HCL ER 60 MG PO CP12: 60.0000 mg | ORAL_CAPSULE | Freq: Two times a day (BID) | ORAL | Status: DC

## 2015-02-14 ENCOUNTER — Other Ambulatory Visit: Payer: Self-pay | Admitting: Family Medicine

## 2015-02-24 ENCOUNTER — Other Ambulatory Visit: Payer: Self-pay | Admitting: Cardiology

## 2015-04-08 ENCOUNTER — Other Ambulatory Visit: Payer: Self-pay | Admitting: Family Medicine

## 2015-05-05 ENCOUNTER — Other Ambulatory Visit: Payer: Self-pay | Admitting: Family Medicine

## 2015-05-29 ENCOUNTER — Other Ambulatory Visit: Payer: Self-pay | Admitting: Cardiology

## 2015-06-03 ENCOUNTER — Other Ambulatory Visit: Payer: Self-pay | Admitting: Family Medicine

## 2015-06-22 ENCOUNTER — Other Ambulatory Visit: Payer: Self-pay

## 2015-06-22 DIAGNOSIS — Z1231 Encounter for screening mammogram for malignant neoplasm of breast: Secondary | ICD-10-CM

## 2015-06-23 ENCOUNTER — Telehealth: Payer: Self-pay | Admitting: *Deleted

## 2015-06-23 ENCOUNTER — Other Ambulatory Visit: Payer: Self-pay

## 2015-06-23 MED ORDER — FLECAINIDE ACETATE 100 MG PO TABS: 100.0000 mg | ORAL_TABLET | Freq: Two times a day (BID) | ORAL | Status: DC

## 2015-06-23 MED ORDER — NORETHINDRONE-ETH ESTRADIOL 1-5 MG-MCG PO TABS: ORAL_TABLET | ORAL | Status: DC

## 2015-06-23 NOTE — Telephone Encounter (Signed)
Rx sent to the pts local pharmacy as she needs an appt.

## 2015-06-23 NOTE — Telephone Encounter (Signed)
[facility] Fax [phone] Refill request JINTELI 1-5 MG-MCG TABS tablet

## 2015-06-28 ENCOUNTER — Telehealth: Payer: Self-pay | Admitting: *Deleted

## 2015-06-28 ENCOUNTER — Other Ambulatory Visit: Payer: Self-pay | Admitting: *Deleted

## 2015-06-28 MED ORDER — DILTIAZEM HCL ER 60 MG PO CP12: 60.0000 mg | ORAL_CAPSULE | Freq: Two times a day (BID) | ORAL | Status: DC

## 2015-06-28 MED ORDER — FLECAINIDE ACETATE 100 MG PO TABS: 100.0000 mg | ORAL_TABLET | Freq: Two times a day (BID) | ORAL | Status: DC

## 2015-06-28 NOTE — Telephone Encounter (Signed)
[facility] tab - requesting a 90 day refill

## 2015-06-28 NOTE — Telephone Encounter (Signed)
Rx denied for 90 day supply as the pt needs an appt.  One month supply was sent to the pts local pharmacy on 4/20.

## 2015-07-02 ENCOUNTER — Other Ambulatory Visit: Payer: Self-pay | Admitting: Family Medicine

## 2015-07-11 ENCOUNTER — Encounter: Payer: Self-pay | Admitting: Cardiology

## 2015-07-11 ENCOUNTER — Ambulatory Visit (INDEPENDENT_AMBULATORY_CARE_PROVIDER_SITE_OTHER): Payer: 59 | Admitting: Cardiology

## 2015-07-11 VITALS — BP 130/86 | HR 66 | Ht 63.0 in | Wt 187.0 lb

## 2015-07-11 DIAGNOSIS — I480 Paroxysmal atrial fibrillation: Secondary | ICD-10-CM | POA: Diagnosis not present

## 2015-07-11 DIAGNOSIS — I471 Supraventricular tachycardia: Secondary | ICD-10-CM

## 2015-07-11 DIAGNOSIS — I4589 Other specified conduction disorders: Secondary | ICD-10-CM

## 2015-07-11 DIAGNOSIS — R5383 Other fatigue: Secondary | ICD-10-CM

## 2015-07-11 DIAGNOSIS — I484 Atypical atrial flutter: Secondary | ICD-10-CM

## 2015-07-11 DIAGNOSIS — R0609 Other forms of dyspnea: Secondary | ICD-10-CM | POA: Diagnosis not present

## 2015-07-11 MED ORDER — DILTIAZEM HCL 30 MG PO TABS: 30.0000 mg | ORAL_TABLET | Freq: Two times a day (BID) | ORAL | Status: DC

## 2015-07-11 NOTE — Progress Notes (Signed)
PCP: Veronica Valencia., DO  Clinic Note: Chief Complaint  Patient presents with  . Follow-up    asymptomatic today  . Atrial Fibrillation    As well as PSVT    HPI: Veronica Valencia is a 60 y.o. female with a PMH below who presents today for Six-month follow-up of her various arrhythmias including atrial flutter/fibrillation and PSVT. She had A. fib with RVR last fall and midplane TEE guided cardioversion, but it increased her flecainide 200 mg twice a day and she converted to sinus rhythm. She is maintained sinus rhythm now on a standing dose of flecainide plus diltiazem  Veronica Valencia was last seen on 12/28/2014. She is doing fairly well then. Was back in sinus rhythm. RECOMMEND- IF YOU FEEL HEART RACING FAST ( AFIB) TAKE AN EXTRA 300 MG FLECAINIDE AND 120 MG DILTIAZEM  IF YOU FEEL HEART RATE DIFFERENT -SLOW TAKE AN EXTRA 300 MG FLECAINIDE AND 60 MG DILTIAZEM . IF RATE IS STILL THERE AFTER 6 HOURS FOR BOTH YOU MAY TAKE ANOTHER 300 MG FLECAINIDE. IF SYMPTOMS STILL OCCUR AFTER ANOTHER 6 HOURS CONTACT OFFICE.  IF SYMPTOMS INTOLERABLE GO TO HOSPITAL  Recent Hospitalizations: None  Studies Reviewed: None  Interval History: Veronica Valencia comes in today for routine follow-up and feels fine. The  only thing she notes, Is that she still has a sense of fatigue in overall general loss of energy. She is concerned about the medicines she is on. She is hoping that we can cut back some of the medicines. She is however happy that she's not had any further episodes of palpitations despite having significant social stressors with work. Next a overall however things are doing very well at work, and she is now celebrating her daughter and son's graduations from [employer] upcoming. She is very happy about that. Her son-in-law's be starting residency program in [place] at [employer] in the fall.  Overall from a cardiac standpoint besides the fatigue, she is essentially stable. Review of symptoms as follows:: No chest pain  or shortness of breath with rest or exertion. No PND, orthopnea or edema. No palpitations, lightheadedness, dizziness, weakness or syncope/near syncope. No TIA/amaurosis fugax symptoms. No melena, hematochezia, hematuria, or epstaxis. No claudication.  ROS: A comprehensive was performed. Review of Systems  Constitutional: Positive for malaise/fatigue. Negative for weight loss.  HENT: Negative for congestion and nosebleeds.   Eyes: Negative for blurred vision.  Respiratory: Negative for cough, shortness of breath and wheezing.   Cardiovascular: Negative for claudication.  Gastrointestinal: Negative for blood in stool and melena.  Genitourinary: Negative for hematuria.  Musculoskeletal: Negative for joint pain and falls.  Neurological: Negative for dizziness.  Endo/Heme/Allergies: Bruises/bleeds easily.  Psychiatric/Behavioral: Negative for memory loss. The patient is not nervous/anxious and does not have insomnia.   All other systems reviewed and are negative.    Past Medical History  Diagnosis Date  . Hot flashes   . Episcleritis of right eye   . Paroxysmal SVT (supraventricular tachycardia) ([place]) 10/06/2012    This may have simply been misdiagnosed A. fib  . PAF (paroxysmal atrial fibrillation) [facility]) July 2014  . Paroxysmal atrial flutter [facility]) August 2014  . H/O echocardiogram OS 20/40    EF 60-65% with mild concentric LVH. Normal wall motion. Grade 1 diastolic dysfunction. Trace aortic regurgitation. No clear-cut evidence of murmur source  . Abnormal exercise tolerance test 11/24/2012    Did not reach 85% maximum heart rate. No ischemic changes noted at 79%. Notably beta blocker was not held;  no arrhythmias noted.    Past Surgical History  Procedure Laterality Date  . Varicose vein surgery  1990  . Cesarean section     Prior to Admission medications   Medication Sig Start Date End Date Taking? Authorizing Provider  aspirin EC 81 MG tablet Take 81 mg by mouth daily.   Yes  Historical Provider, MD  flecainide (TAMBOCOR) 100 MG tablet Take 1 tablet (100 mg total) by mouth 2 (two) times daily. 06/28/15  Yes Leonie Man, MD  Ibuprofen (ADVIL PO) Take by mouth as needed (takes three tablets as needed for knee pain).   Yes Historical Provider, MD  norethindrone-ethinyl estradiol (JINTELI) 1-5 MG-MCG TABS tablet TAKE 1 TABLET BY MOUTH EVERY MORNING (NEED APPT FOR MORE REFILLS!!) 06/23/15  Yes Veronica Kern, DO  diltiazem (CARDIZEM) 60 MG tablet Take 1 tablet (30 mg total) by mouth 2 (two) times daily. 06/23/15   Leonie Man, MD   No Known Allergies   Social History   Social History  . Marital Status: Married    Spouse Name: N/A  . Number of Children: N/A  . Years of Education: N/A   Social History Main Topics  . Smoking status: Never Smoker   . Smokeless tobacco: Never Used  . Alcohol Use: Yes     Comment: couple of glasses of wine daily   . Drug Use: No  . Sexual Activity: Not Asked   Other Topics Concern  . 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   family history includes Cancer (age of onset: 33) in her sister; Diabetes in her mother; Heart attack (age of onset: 22) in her father; Hypertension in her father and mother; Stroke in her father.   Wt Readings from Last 3 Encounters:  07/11/15 187 lb (84.823 kg)  12/28/14 184 lb 14.4 oz (83.87 kg)  11/19/14 185 lb (83.915 kg)    PHYSICAL EXAM BP 130/86 mmHg  Pulse 66  Ht 5\' 3"  (1.6 m)  Wt 187 lb (84.823 kg)  BMI 33.13 kg/m2 General appearance: alert, cooperative, appears stated age, no distress and mildly obese HEENT: [place]/AT, EOMI, MMM, anicteric sclera Neck: no adenopathy, no carotid bruit and no JVD HEENT: [place]/AT, EOMI, MMM, anicteric sclera Lungs: CTAB,  normal percussion bilaterally and non-labored Heart: RRR ; S1, S2 normal, soft 1-2/6 SEM at RUSB; no click, rub or gallop; nondisplaced PMI Abdomen: soft, non-tender; bowel sounds normal; no masses, no organomegaly; Extremities: extremities normal, atraumatic, no cyanosis, and no edema; Pulses: 2+ and symmetric;  Neurologic: Mental status: Alert, oriented, thought content appropriate; Cranial nerves: II-XII grossly intact Psych: normal/pleasant mood and affect.   Adult ECG Report  Rate: 66 ;  Rhythm: normal sinus rhythm and Normal axis, intervals and durations;   Narrative Interpretation: Normal EKG. Stable   Other studies Reviewed: Additional studies/ records that were reviewed today include:  Recent Labs:  PCP   ASSESSMENT / PLAN: Problem List Items Addressed This Visit    Paroxysmal SVT (supraventricular tachycardia) ([place]) (Chronic)    Pretty much controlled with flecainide. She is also still on the low-dose aspirin channel blocker. We discussed vagal maneuvers.      Relevant Medications   diltiazem (CARDIZEM) 30  MG tablet   Paroxysmal atrial fibrillation /flutter (Chronic)    We now had this followed a control with standing dose of flecainide. At this point to try to help her energy level I think we can try to cough on one of the other medicine. For now I'm more inclined to try to maintain sinus rhythm. Therefore we can back off on the diltiazem to 30 mg twice a day. CHADS2VASC2 score ~1. We are not having her on standing dose of Xarelto. Basically she will take it for a month following any recurrence of A. fib during which she takes the flecainide when necessary. We will continue the 100 mg twice a day flecainide. Continue the same when necessary treatment with 300 mg flecainide plus additional diltiazem for breakthrough as previously noted.       Relevant Medications   diltiazem (CARDIZEM) 30 MG tablet   Fatigue    Her fatigue is has improved since being off beta blocker,  but she still has on the flecainide and diltiazem. The plan is to reduce diltiazem dose to 30 mg twice a day. Hopefully that will help. We can reassess in the fall for follow-up      Exertional dyspnea - with Beta Blockers (Chronic)   Chronotropic incompetence with beta blockers (Chronic)   Atrial flutter (HCC) - Primary   Relevant Medications   diltiazem (CARDIZEM) 30 MG tablet   Other Relevant Orders   EKG 12-Lead      Current medicines are reviewed at length with the patient today. (+/- concerns)  Can she cut back something to help with more energy The following changes have been made:  STOP 60 MG DILTIAZEM  START 30 MG DILTIAZEM  ONE TABLET TWICE A DAY  Your physician wants you to follow-up in [place].-30 MIN  Studies Ordered:   Orders Placed This Encounter  Procedures  . EKG 12-Lead      Leonie Man, M.D., M.S. Interventional Cardiologist   Pager # [phone] Phone # [phone] [address] [place], [place] [address]

## 2015-07-11 NOTE — Patient Instructions (Addendum)
STOP 60 MG DILTIAZEM  START 30 MG DILTIAZEM  ONE TABLET TWICE A DAY  Your physician wants you to follow-up in [place].-30 MIN You will receive a reminder letter in the mail two months in advance. If you don't receive a letter, please call our office to schedule the follow-up appointment.

## 2015-07-13 ENCOUNTER — Encounter: Payer: Self-pay | Admitting: Cardiology

## 2015-07-13 NOTE — Assessment & Plan Note (Signed)
Her fatigue is has improved since being off beta blocker, but she still has on the flecainide and diltiazem. The plan is to reduce diltiazem dose to 30 mg twice a day. Hopefully that will help. We can reassess in the fall for follow-up

## 2015-07-13 NOTE — Assessment & Plan Note (Signed)
Pretty much controlled with flecainide. She is also still on the low-dose aspirin channel blocker. We discussed vagal maneuvers.

## 2015-07-13 NOTE — Assessment & Plan Note (Signed)
We now had this followed a control with standing dose of flecainide. At this point to try to help her energy level I think we can try to cough on one of the other medicine. For now I'm more inclined to try to maintain sinus rhythm. Therefore we can back off on the diltiazem to 30 mg twice a day. CHADS2VASC2 score ~1. We are not having her on standing dose of Xarelto. Basically she will take it for a month following any recurrence of A. fib during which she takes the flecainide when necessary. We will continue the 100 mg twice a day flecainide. Continue the same when necessary treatment with 300 mg flecainide plus additional diltiazem for breakthrough as previously noted.

## 2015-08-05 ENCOUNTER — Other Ambulatory Visit: Payer: Self-pay | Admitting: Family Medicine

## 2015-08-08 ENCOUNTER — Ambulatory Visit: Payer: BLUE CROSS/BLUE SHIELD

## 2015-08-30 ENCOUNTER — Other Ambulatory Visit: Payer: Self-pay | Admitting: Family Medicine

## 2015-08-31 ENCOUNTER — Other Ambulatory Visit: Payer: Self-pay | Admitting: Cardiology

## 2015-09-03 ENCOUNTER — Other Ambulatory Visit: Payer: Self-pay | Admitting: Family Medicine

## 2015-09-13 ENCOUNTER — Ambulatory Visit (INDEPENDENT_AMBULATORY_CARE_PROVIDER_SITE_OTHER): Payer: 59 | Admitting: Family Medicine

## 2015-09-13 ENCOUNTER — Encounter: Payer: Self-pay | Admitting: Family Medicine

## 2015-09-13 VITALS — BP 122/80 | HR 70 | Temp 98.3°F | Ht 63.0 in | Wt 188.7 lb

## 2015-09-13 DIAGNOSIS — Z6833 Body mass index (BMI) 33.0-33.9, adult: Secondary | ICD-10-CM

## 2015-09-13 DIAGNOSIS — N951 Menopausal and female climacteric states: Secondary | ICD-10-CM

## 2015-09-13 DIAGNOSIS — I480 Paroxysmal atrial fibrillation: Secondary | ICD-10-CM | POA: Diagnosis not present

## 2015-09-13 DIAGNOSIS — R232 Flushing: Secondary | ICD-10-CM

## 2015-09-13 MED ORDER — NORETHINDRONE-ETH ESTRADIOL 1-5 MG-MCG PO TABS: 1.0000 | ORAL_TABLET | Freq: Every day | ORAL | Status: DC

## 2015-09-13 NOTE — Progress Notes (Signed)
Pre visit review using our clinic review tool, if applicable. No additional management support is needed unless otherwise documented below in the visit note. 

## 2015-09-13 NOTE — Patient Instructions (Signed)
Physical in 4-6 months.  Taper off of the hormones.   We recommend the following healthy lifestyle: 1) Small portions - eat off of salad plate instead of dinner plate 2) Eat a healthy clean diet with avoidance of (less then 1 serving per week) processed foods, sweetened drinks, white starches, red meat, fast foods and sweets and consisting of: * 5-9 servings per day of fresh or frozen fruits and vegetables (not corn or potatoes, not dried or canned) *nuts and seeds, beans *olives and olive oil *small portions of lean meats such as fish and white chicken  *small portions of whole grains 3)Get at least 150 minutes of sweaty aerobic exercise per week 4)reduce stress - counseling, meditation, relaxation to balance other aspects of your life

## 2015-09-13 NOTE — Progress Notes (Signed)
HPI:  Veronica Valencia is a very pleasant 60 yo whom sees cardiology for her heart arrhythmias and is here today to follow up. She wants to come off of the HRT she has been taking for 6 years for hot flashes. She has done some reading on this matter and would like to taper off. She has felt well without any heart issues recently. Walks 5 miles per day and reports a healthy diet.  ROS: See pertinent positives and negatives per HPI.  Past Medical History  Diagnosis Date  . Hot flashes   . Episcleritis of right eye   . Paroxysmal SVT (supraventricular tachycardia) ([place]) 10/06/2012    This may have simply been misdiagnosed A. fib  . PAF (paroxysmal atrial fibrillation) [facility]) July 2014  . Paroxysmal atrial flutter [facility]) August 2014  . H/O echocardiogram OS 20/40    EF 60-65% with mild concentric LVH. Normal wall motion. Grade 1 diastolic dysfunction. Trace aortic regurgitation. No clear-cut evidence of murmur source  . Abnormal exercise tolerance test 11/24/2012    Did not reach 85% maximum heart rate. No ischemic changes noted at 79%. Notably beta blocker was not held; no arrhythmias noted.    Past Surgical History  Procedure Laterality Date  . Varicose vein surgery  1990  . Cesarean section      Family History  Problem Relation Age of Onset  . Diabetes Mother   . Hypertension Mother   . Stroke Father   . Hypertension Father   . Cancer Sister 68    breast  . Heart attack Father 68    Social History   Social History  . Marital Status: Married    Spouse Name: N/A  . Number of Children: N/A  . Years of Education: N/A   Social History Main Topics  . Smoking status: Never Smoker   . Smokeless tobacco: Never Used  . Alcohol Use: Yes     Comment: couple of glasses of wine daily   . Drug Use: No  . Sexual Activity: Not Asked   Other Topics Concern  . 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     Current outpatient prescriptions:  .  aspirin EC 81 MG tablet, Take 81 mg by mouth daily., Disp: , Rfl:  .  diltiazem (CARDIZEM) 30 MG tablet, Take 1 tablet (30 mg total) by mouth 2 (two) times daily., Disp: 180 tablet, Rfl: 3 .  flecainide (TAMBOCOR) 100 MG tablet, Take 1 tablet by mouth two  times daily, Disp: 180 tablet, Rfl: 2 .  Ibuprofen (ADVIL PO), Take by mouth as needed (takes three tablets as needed for knee pain)., Disp: , Rfl:  .  norethindrone-ethinyl estradiol (JINTELI) 1-5 MG-MCG TABS tablet, Take 1 tablet by mouth daily., Disp: 28 tablet, Rfl: 1  EXAM:  Filed Vitals:   09/13/15 1508  BP: 122/80  Pulse: 70  Temp: 98.3 F (36.8 C)    Body mass index is 33.44 kg/(m^2).  GENERAL: vitals reviewed and listed above, alert, oriented, appears well hydrated and in no acute distress  HEENT: atraumatic, conjunttiva clear, no obvious abnormalities on inspection of external nose and ears  NECK: no obvious masses on inspection  LUNGS: clear to  auscultation bilaterally, no wheezes, rales or rhonchi, good air movement  CV: HRRR, no peripheral edema  MS: moves all extremities without noticeable abnormality  PSYCH: pleasant and cooperative, no obvious depression or anxiety  ASSESSMENT AND PLAN:  Discussed the following assessment and plan:  Hot flashes  Paroxysmal atrial fibrillation /flutter  BMI 33.0-33.9,adult  -taper off hormones - discuss risks/benefits/options -lifestlye recs -glad she is doing well interm of heart condition -CPE in 4 months with labs -Patient advised to return or notify a doctor immediately if symptoms worsen or persist or new concerns arise.  Patient Instructions  Physical in 4-6 months.  Taper off of the hormones.   We recommend the following healthy lifestyle: 1) Small portions -  eat off of salad plate instead of dinner plate 2) Eat a healthy clean diet with avoidance of (less then 1 serving per week) processed foods, sweetened drinks, white starches, red meat, fast foods and sweets and consisting of: * 5-9 servings per day of fresh or frozen fruits and vegetables (not corn or potatoes, not dried or canned) *nuts and seeds, beans *olives and olive oil *small portions of lean meats such as fish and white chicken  *small portions of whole grains 3)Get at least 150 minutes of sweaty aerobic exercise per week 4)reduce stress - counseling, meditation, relaxation to balance other aspects of your life     Colin Benton R., DO

## 2015-09-19 ENCOUNTER — Ambulatory Visit
Admission: RE | Admit: 2015-09-19 | Discharge: 2015-09-19 | Disposition: A | Discharge status: Home / Self Care | Payer: 59 | Source: Ambulatory Visit

## 2015-09-19 DIAGNOSIS — Z1231 Encounter for screening mammogram for malignant neoplasm of breast: Secondary | ICD-10-CM

## 2015-11-25 ENCOUNTER — Other Ambulatory Visit: Payer: Self-pay | Admitting: Family Medicine

## 2016-01-04 ENCOUNTER — Emergency Department (HOSPITAL_COMMUNITY)
Admission: EM | Admit: 2016-01-04 | Discharge: 2016-01-05 | Disposition: A | Discharge status: Home / Self Care | Payer: 59 | Attending: Emergency Medicine | Admitting: Emergency Medicine

## 2016-01-04 ENCOUNTER — Other Ambulatory Visit: Payer: Self-pay | Admitting: Physician Assistant

## 2016-01-04 ENCOUNTER — Emergency Department (HOSPITAL_COMMUNITY): Payer: 59

## 2016-01-04 ENCOUNTER — Telehealth: Payer: Self-pay | Admitting: Cardiology

## 2016-01-04 ENCOUNTER — Encounter (HOSPITAL_COMMUNITY): Payer: Self-pay

## 2016-01-04 DIAGNOSIS — R002 Palpitations: Secondary | ICD-10-CM | POA: Diagnosis not present

## 2016-01-04 DIAGNOSIS — I4892 Unspecified atrial flutter: Secondary | ICD-10-CM | POA: Diagnosis not present

## 2016-01-04 DIAGNOSIS — Z7982 Long term (current) use of aspirin: Secondary | ICD-10-CM | POA: Diagnosis not present

## 2016-01-04 LAB — CBC
HCT: 43 % (ref 36.0–46.0)
Hemoglobin: 15.2 g/dL — ABNORMAL HIGH (ref 12.0–15.0)
MCH: 33.6 pg (ref 26.0–34.0)
MCHC: 35.3 g/dL (ref 30.0–36.0)
MCV: 95.1 fL (ref 78.0–100.0)
PLATELETS: 253 10*3/uL (ref 150–400)
RBC: 4.52 MIL/uL (ref 3.87–5.11)
RDW: 11.8 % (ref 11.5–15.5)
WBC: 10.5 10*3/uL (ref 4.0–10.5)

## 2016-01-04 LAB — BASIC METABOLIC PANEL
Anion gap: 13 (ref 5–15)
BUN: 13 mg/dL (ref 6–20)
CALCIUM: 9.9 mg/dL (ref 8.9–10.3)
CO2: 22 mmol/L (ref 22–32)
CREATININE: 0.85 mg/dL (ref 0.44–1.00)
Chloride: 101 mmol/L (ref 101–111)
Glucose, Bld: 124 mg/dL — ABNORMAL HIGH (ref 65–99)
Potassium: 3.9 mmol/L (ref 3.5–5.1)
SODIUM: 136 mmol/L (ref 135–145)

## 2016-01-04 LAB — I-STAT TROPONIN, ED: TROPONIN I, POC: 0.01 ng/mL (ref 0.00–0.08)

## 2016-01-04 MED ORDER — DILTIAZEM HCL ER COATED BEADS 120 MG PO CP24: 120.0000 mg | ORAL_CAPSULE | Freq: Every day | ORAL | 11 refills | Status: DC

## 2016-01-04 MED ORDER — SODIUM CHLORIDE 0.9 % IV BOLUS (SEPSIS)
1000.0000 mL | Freq: Once | INTRAVENOUS | Status: AC
  Administered 2016-01-04: 1000 mL via INTRAVENOUS

## 2016-01-04 MED ORDER — DILTIAZEM HCL ER COATED BEADS 120 MG PO CP24
120.0000 mg | ORAL_CAPSULE | Freq: Once | ORAL | Status: AC
  Administered 2016-01-04: 120 mg via ORAL
  Filled 2016-01-04: qty 1

## 2016-01-04 MED ORDER — DILTIAZEM HCL 100 MG IV SOLR
5.0000 mg/h | Freq: Once | INTRAVENOUS | Status: AC
  Administered 2016-01-04: 5 mg/h via INTRAVENOUS
  Filled 2016-01-04: qty 100

## 2016-01-04 MED ORDER — DILTIAZEM HCL 25 MG/5ML IV SOLN
20.0000 mg | Freq: Once | INTRAVENOUS | Status: AC
  Administered 2016-01-04: 20 mg via INTRAVENOUS
  Filled 2016-01-04: qty 5

## 2016-01-04 NOTE — ED Notes (Signed)
Repeat EKG given to [place]. Pt in NSR at this time

## 2016-01-04 NOTE — ED Notes (Signed)
Cardiologist at bedside.  

## 2016-01-04 NOTE — Telephone Encounter (Signed)
Thanks. I will follow-up with ER visit.   Glenetta Hew, MD

## 2016-01-04 NOTE — Telephone Encounter (Signed)
Pt states she woke this AM around 5 with palps, fast, irreg HR. She notes she took her usual dose of flecainide and diltiazem early (around 7am), also took extra doses this morning to see if this would resolve. She unfortunately does not have HR or BP readings at home, but feels her rate is fast. She also made note of feeling lightheaded & dizzy, diaphoresis & "clammy" feeling. Advised patient to go to ER - possibly will need rate or rhythm correction via IV meds. Acknowledged and will have driver take her to [facility] today. Patient aware we can discuss follow up in A Fib clinic or office once stable.

## 2016-01-04 NOTE — ED Notes (Signed)
The pt has no pain connected to a zoll for med to be given soon.  Dizziness and sweating  With sweating and a fast heart rate since 0500am today  She feels ok now

## 2016-01-04 NOTE — Discharge Instructions (Signed)
A prescription for diltiazem 120 mg was called into her pharmacy. Please start taking this dose instead of the 30 mg twice a day. Continue with your flecainide as previously prescribed.

## 2016-01-04 NOTE — ED Provider Notes (Signed)
[facility] DEPT Provider Note   CSN: [id number] Arrival date & time: 01/04/16  1425     History   Chief Complaint No chief complaint on file.   HPI Veronica Valencia is a 60 y.o. female.  HPI  60 year old female with a history of A. fib/A flutter, and SVT on diltiazem and flecainide presents to the ED for irregular and rapid palpitations since 5 am this morning. No exacerbating or relieving factors. Called Cardiology and told to take extra Flecanide. No chest pain, SOB, dizziness, leg swelling, nausea, or emesis.  No recent illnesses or infections. Has been working outside for [employer] and not hydrating as much.  Past Medical History:  Diagnosis Date  . Abnormal exercise tolerance test 11/24/2012   Did not reach 85% maximum heart rate. No ischemic changes noted at 79%. Notably beta blocker was not held; no arrhythmias noted.  Marland Kitchen Episcleritis of right eye   . H/O echocardiogram OS 20/40   EF 60-65% with mild concentric LVH. Normal wall motion. Grade 1 diastolic dysfunction. Trace aortic regurgitation. No clear-cut evidence of murmur source  . Hot flashes   . PAF (paroxysmal atrial fibrillation) [facility]) July 2014  . Paroxysmal atrial flutter [facility]) August 2014  . Paroxysmal SVT (supraventricular tachycardia) ([place]) 10/06/2012   This may have simply been misdiagnosed A. fib    Patient Active Problem List   Diagnosis Date Noted  . Exertional dyspnea - with Beta Blockers 01/06/2014  . Chronotropic incompetence with beta blockers 01/06/2014    Class: Question of  . Fatigue 06/18/2013  . Chest pain with low risk for cardiac etiology 11/30/2012  . Paroxysmal SVT (supraventricular tachycardia) ([place]) 10/06/2012  . Atrial flutter ([place]) 10/06/2012    Class: Diagnosis of  . Aortic valve sclerosis 10/06/2012  . Paroxysmal atrial fibrillation /flutter 09/19/2012  . Hot flashes 08/28/2012  . FH: breast cancer in first degree relative 08/28/2012  . Scleritis and episcleritis of  right eye 08/28/2012    Past Surgical History:  Procedure Laterality Date  . CESAREAN SECTION    . [place]    OB History    No data available       Home Medications    Prior to Admission medications   Medication Sig Start Date End Date Taking? Authorizing Provider  Artificial Tear Solution (SOOTHE HYDRATION OP) Place 1 drop into both eyes daily as needed (dry eyes).   Yes Historical Provider, MD  aspirin EC 81 MG tablet Take 81 mg by mouth daily.   Yes Historical Provider, MD  CALCIUM PO Take 1 tablet by mouth daily.   Yes Historical Provider, MD  cholecalciferol (VITAMIN D) 400 units TABS tablet Take 400 Units by mouth daily.   Yes Historical Provider, MD  diltiazem (CARDIZEM) 30 MG tablet Take 1 tablet (30 mg total) by mouth 2 (two) times daily. 07/11/15  Yes Leonie Man, MD  flecainide [facility]) 100 MG tablet Take 1 tablet by mouth two  times daily 08/31/15  Yes Leonie Man, MD  Ibuprofen (ADVIL PO) Take 200 mg by mouth every 6 (six) hours as needed (pain).    Yes Historical Provider, MD  JINTELI 1-5 MG-MCG TABS tablet TAKE 1 TABLET BY MOUTH DAILY. Patient not taking: Reported on 01/04/2016 11/28/15   Lucretia Kern, DO    Family History Family History  Problem Relation Age of Onset  . Diabetes Mother   . Hypertension Mother   . Stroke Father   . Hypertension Father   .  Heart attack Father 69  . Cancer Sister 67    breast    Social History Social History  Substance Use Topics  . Smoking status: Never Smoker  . Smokeless tobacco: Never Used  . Alcohol use Yes     Comment: couple of glasses of wine daily      Allergies   Review of patient's allergies indicates no known allergies.   Review of Systems Review of Systems  Constitutional: Negative for chills and fever.  HENT: Negative for ear pain and sore throat.   Eyes: Negative for pain and visual disturbance.  Respiratory: Negative for cough and shortness of breath.   Cardiovascular:  Positive for palpitations. Negative for chest pain and leg swelling.  Gastrointestinal: Negative for abdominal pain and vomiting.  Genitourinary: Negative for dysuria and hematuria.  Musculoskeletal: Negative for arthralgias and back pain.  Skin: Negative for color change and rash.  Neurological: Negative for seizures and syncope.  All other systems reviewed and are negative.    Physical Exam Updated Vital Signs BP (!) 160/103 (BP Location: Left Arm)   Pulse 113   Temp 98 F (36.7 C) (Oral)   Resp 20   Ht 5\' 3"  (1.6 m)   Wt 179 lb (81.2 kg)   SpO2 98%   BMI 31.71 kg/m   Physical Exam  Constitutional: She is oriented to person, place, and time. She appears well-developed and well-nourished. No distress.  HENT:  Head: Normocephalic and atraumatic.  Nose: Nose normal.  Eyes: Conjunctivae and EOM are normal. Pupils are equal, round, and reactive to light. Right eye exhibits no discharge. Left eye exhibits no discharge. No scleral icterus.  Neck: Normal range of motion. Neck supple.  Cardiovascular: Regular rhythm.  Tachycardia present.  Exam reveals no gallop and no friction rub.   No murmur heard. Pulmonary/Chest: Effort normal and breath sounds normal. No stridor. No respiratory distress. She has no rales.  Abdominal: Soft. She exhibits no distension. There is no tenderness.  Musculoskeletal: She exhibits no edema or tenderness.  Neurological: She is alert and oriented to person, place, and time.  Skin: Skin is warm and dry. No rash noted. She is not diaphoretic. No erythema.  Psychiatric: She has a normal mood and affect.  Vitals reviewed.    ED Treatments / Results  Labs (all labs ordered are listed, but only abnormal results are displayed) Labs Reviewed  BASIC METABOLIC PANEL - Abnormal; Notable for the following:       Result Value   Glucose, Bld 124 (*)    All other components within normal limits  CBC - Abnormal; Notable for the following:    Hemoglobin 15.2 (*)     All other components within normal limits  I-STAT TROPOININ, ED    EKG  EKG Interpretation  Date/Time:  Wednesday January 04 2016 14:40:29 EDT Ventricular Rate:  119 PR Interval:  152 QRS Duration: 162 QT Interval:  304 QTC Calculation: 427 R Axis:   -75 Text Interpretation:  Sinus tachycardia with Premature atrial complexes Left axis deviation Right bundle branch block Possible Anterior infarct , age undetermined Abnormal ECG Confirmed by [facility] MD, Kaitlinn Iversen ([id number]) on 01/04/2016 3:14:15 PM       Radiology Dg Chest Port 1 View  Result Date: 01/04/2016 CLINICAL DATA:  Cardiac palpitations EXAM: PORTABLE CHEST 1 VIEW COMPARISON:  September 23, 2012 FINDINGS: Overlying defibrillator paddle noted. There is no edema or consolidation. Heart size and pulmonary vascularity are normal. No adenopathy. Calcified azygos region lymph nodes  indicates prior granulomatous disease. No adenopathy. No pneumothorax. No bone lesions. IMPRESSION: No edema or consolidation.  Evidence of prior granulomatous disease. Electronically Signed   By: Lowella Grip III M.D.   On: 01/04/2016 16:36    Procedures Procedures (including critical care time)  Medications Ordered in ED Medications  sodium chloride 0.9 % bolus 1,000 mL (0 mLs Intravenous Stopped 01/04/16 1724)  diltiazem (CARDIZEM) injection 20 mg (20 mg Intravenous Given 01/04/16 1758)  diltiazem (CARDIZEM) 100 mg in dextrose 5 % 100 mL (1 mg/mL) infusion (5 mg/hr Intravenous New Bag/Given 01/04/16 1925)     Initial Impression / Assessment and Plan / ED Course  I have reviewed the triage vital signs and the nursing notes.  Pertinent labs & imaging results that were available during my care of the patient were reviewed by me and considered in my medical decision making (see chart for details).  Clinical Course    EKG concerning for atrial flutter with RVR. Patient given IV bolus without resolution. Diltiazem bolus was unsuccessful. Patient started  on Cardizem drip converted the patient and achieve rate control. Cardiology consultation Who recommended giving by mouth bolus of 120 mg of diltiazem and attempt a trial off of the diltiazem drip. The patient remained stable with no recurrence until she is safe with close follow-up in the clinic.  By mouth bolus given. Patient was taken off of diltiazem drip 1 hour after the by mouth bolus. She was monitored for an additional hour following trip discontinuation without recurrence.  Safe for discharge with strict return precautions.    Final Clinical Impressions(s) / ED Diagnoses   Final diagnoses:  Palpitations  Paroxysmal atrial flutter ([place])   Disposition: Discharge  Condition: Good  I have discussed the results, Dx and Tx plan with the patient who expressed understanding and agree(s) with the plan. Discharge instructions discussed at great length. The patient was given strict return precautions who verbalized understanding of the instructions. No further questions at time of discharge.    Current Discharge Medication List    START taking these medications   Details  diltiazem (CARDIZEM CD) 120 MG 24 hr capsule Take 1 capsule (120 mg total) by mouth daily. Qty: 30 capsule, Refills: 11        Follow Up: Lucretia Kern, DO [address] [place] [address] [phone]  Schedule an appointment as soon as possible for a visit  As needed  Leonie Man, MD [address] [place] [facility] [place] [address] [phone]  Call  in 3-5 days if you have not been contacted for follow-up appointment.      Fatima Blank, MD 01/04/16 [phone]

## 2016-01-04 NOTE — ED Notes (Signed)
No radical change in her hear rate  It is slowly getting lower  bp ok  No pain or discomfort

## 2016-01-04 NOTE — Consult Note (Signed)
CARDIOLOGY CONSULT NOTE   Patient ID: Veronica Valencia MRN: [id number], DOB/AGE: 1955-05-08   Admit date: 01/04/2016 Date of Consult: 01/04/2016   Primary Physician: Lucretia Kern., DO Primary Cardiologist: Dr. Ellyn Hack  Pt. Profile  Veronica Valencia is a pleasant 60 year old Caucasian female with past medical history of PSVT and afib presented with paroxysmal atrial flutter  Problem List  Past Medical History:  Diagnosis Date  . Abnormal exercise tolerance test 11/24/2012   Did not reach 85% maximum heart rate. No ischemic changes noted at 79%. Notably beta blocker was not held; no arrhythmias noted.  Marland Kitchen Episcleritis of right eye   . H/O echocardiogram OS 20/40   EF 60-65% with mild concentric LVH. Normal wall motion. Grade 1 diastolic dysfunction. Trace aortic regurgitation. No clear-cut evidence of murmur source  . Hot flashes   . PAF (paroxysmal atrial fibrillation) [facility]) July 2014  . Paroxysmal atrial flutter [facility]) August 2014  . Paroxysmal SVT (supraventricular tachycardia) ([place]) 10/06/2012   This may have simply been misdiagnosed A. fib    Past Surgical History:  Procedure Laterality Date  . CESAREAN SECTION    . VARICOSE VEIN SURGERY  1990     Allergies  No Known Allergies  HPI   Veronica Valencia is a pleasant 60 year old Caucasian female with past medical history of PSVT and afib. She had a cardioversion in 2014. Echocardiogram obtained on 10/14/2012 showed EF 123456, grade 1 diastolic dysfunction. She also had an exercise tolerance test in September 2014. She has been on 100 mg twice a day of flecainide and 30 mg twice a day of short-acting diltiazem.  Recently, she has been having increasing episode of intermittent palpitation. She has no chest pain. This morning, she woke up at 5 AM feeling palpitations in her chest. She took a dose of diltiazem and flecainide at 5 AM, 8AM, and 1 PM. Due to persistent palpation, she sought medical attention at [facility]. Initially,  she was in what appears to be 2-1 atrial flutter with heart rate of 120. Her heart rate slowed down briefly after placed on diltiazem drip, however quickly bounced back. Around 6:30 PM, she spontaneously converted to sinus rhythm. Cardiology was initially consulted for palpation, however by the time patient was evaluated, she has already been converted.   No current facility-administered medications on file prior to encounter.    Current Outpatient Prescriptions on File Prior to Encounter  Medication Sig Dispense Refill  . aspirin EC 81 MG tablet Take 81 mg by mouth daily.    Marland Kitchen diltiazem (CARDIZEM) 30 MG tablet Take 1 tablet (30 mg total) by mouth 2 (two) times daily. 180 tablet 3  . flecainide (TAMBOCOR) 100 MG tablet Take 1 tablet by mouth two  times daily 180 tablet 2  . Ibuprofen (ADVIL PO) Take 200 mg by mouth every 6 (six) hours as needed (pain).     Marland Kitchen JINTELI 1-5 MG-MCG TABS tablet TAKE 1 TABLET BY MOUTH DAILY. (Patient not taking: Reported on 01/04/2016) 28 tablet 1     Family History Family History  Problem Relation Age of Onset  . Diabetes Mother   . Hypertension Mother   . Stroke Father   . Hypertension Father   . Heart attack Father 1  . Cancer Sister 56    breast     Social History Social History   Social History  . Marital status: Married    Spouse name: N/A  . Number of children: N/A  . Years of education:  N/A   Occupational History  . Not on file.   Social History Main Topics  . Smoking status: Never Smoker  . Smokeless tobacco: Never Used  . Alcohol use Yes     Comment: couple of glasses of wine daily   . Drug use: No  . Sexual activity: Not on file   Other Topics Concern  . Not on file   Social History Narrative   She is a married mother of 3.    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     Review of Systems  General:  No chills, fever, night sweats or weight changes.  Cardiovascular:  No chest pain, dyspnea on exertion, edema, orthopnea, paroxysmal nocturnal dyspnea. +palpitations Dermatological: No rash, lesions/masses Respiratory: No cough, dyspnea Urologic: No hematuria, dysuria Abdominal:   No nausea, vomiting, diarrhea, bright red blood per rectum, melena, or hematemesis Neurologic:  No visual changes, wkns, changes in mental status. All other systems reviewed and are otherwise negative except as noted above.  Physical Exam  Blood pressure 156/84, pulse 67, temperature 98 F (36.7 C), temperature source Oral, resp. rate 18, height 5\' 3"  (1.6 m), weight 179 lb (81.2 kg), SpO2 100 %.  General: Pleasant, NAD Psych: Normal affect. Neuro: Alert and oriented X 3. Moves all extremities spontaneously. HEENT: Normal  Neck: Supple without bruits or JVD. Lungs:  Resp regular and unlabored, CTA. Heart: RRR no s3, s4, or murmurs. Abdomen: Soft, non-tender, non-distended, BS + x 4.  Extremities: No clubbing, cyanosis or edema. DP/PT/Radials 2+ and equal bilaterally.  Labs  No results for input(s): CKTOTAL, CKMB, TROPONINI in the last 72 hours. Lab Results  Component Value Date   WBC 10.5 01/04/2016   HGB 15.2 (H) 01/04/2016   HCT 43.0 01/04/2016   MCV 95.1 01/04/2016   PLT 253 01/04/2016    Recent Labs Lab 01/04/16 1441  NA 136  K 3.9  CL 101  CO2 22  BUN 13  CREATININE 0.85  CALCIUM 9.9  GLUCOSE 124*   Lab Results  Component Value Date   CHOL 181 05/07/2014   HDL 64.30 05/07/2014   LDLCALC 102 (H) 05/07/2014   TRIG 72.0 05/07/2014   No results found for: DDIMER  Radiology/Studies  Dg Chest Port 1 View  Result Date: 01/04/2016 CLINICAL DATA:  Cardiac palpitations EXAM: PORTABLE CHEST 1 VIEW COMPARISON:  September 23, 2012 FINDINGS: Overlying defibrillator paddle noted. There is no edema or consolidation. Heart size and pulmonary  vascularity are normal. No adenopathy. Calcified azygos region lymph nodes indicates prior granulomatous disease. No adenopathy. No pneumothorax. No bone lesions. IMPRESSION: No edema or consolidation.  Evidence of prior granulomatous disease. Electronically Signed   By: Lowella Grip III M.D.   On: 01/04/2016 16:36    ECG  2:1 atrial flutter with RVR  ASSESSMENT AND PLAN  1. Paroxysmal atrial flutter  - This patients CHA2DS2-VASc Score and unadjusted Ischemic Stroke Rate (% per year) is equal to 0.6 % stroke rate/year from a score of 1  Above score calculated as 1 point each if present [CHF, HTN, DM, Vascular=MI/PAD/Aortic Plaque, Age if 65-74, or Female] Above score calculated as 2 points each if present [Age > 75, or Stroke/TIA/TE]   - Other than being female, she has no other risk factors for atrial fibrillation or  atrial flutter. She does have Xarelto at home, however she has not been taking them for quite some time. She has been on aspirin.  - Given the fact that I am not sure how long patient has been having paroxysmal atrial flutter or how frequent, I am hesitant to put the patient on Xarelto long-term at this point.  - Since she has not converted, she does not need to be admitted, we will change her short-acting diltiazem to 120mg  diltiazem CD take at night. She will also need outpatient sleep study. We will also place her on a 30 day event monitor to check for recurrence. If she has significant amount of recurrence, we may have to consider to put her on longer period of Xarelto, and potentially refer her to EP to see if she has potentially failed flecainide.   Hilbert Corrigan, PA-C 01/04/2016, 8:45 PM     Patient seen and examined. Agree with assessment and plan.  Veronica Valencia is a very pleasant 60 year old Caucasian female who has a history of atrial flutter and has been on flecanide in addition to Cardizem low dose at 30 mg twice a day.  He has been followed by Dr.  Ellyn Hack.  An echo Doppler study and show normal systolic function with mild concentric LVH and grade 1 diastolic dysfunction.  She has recently experienced episodes of intermittent palpitations.  This morning she was awakened from sleep at 5 AM with her heart racing.  She took extra flexion.  I in addition to Cardizem.  Due to persistent tachycardia.  [facility] where her initial ECG showed atrial flutter with 2-1 block at 120 bpm.  She initially was treated with a Cardizem bolus alone, heart rate slowed down, but ultimately reverted back to more rapid rhythm.  She was started on a Cardizem drip and ultimately converted back to normal sinus rhythm at 19:31:45.  Telemetry now reveals normal sinus rhythm at 65 bpm.  Her blood pressure was initially elevated but improved to 130/80.  She denies associated chest tightness.  Her ECG post-pharmacologic cardioversion reveals normal sinus rhythm at 67 bpm with left anterior hemiblock.  Presently, she feels well.  There is no chest pain.  I am recommending that we initiate Cardizem CD at 120 mg at bedtime.  I am also concerned that her symptoms developed at 5 AM when she most likely is a REM sleep and I strongly have recommended that she undergo a diagnostic polysomnogram to assess for obstructive sleep apnea.  If she develops recurrent episodes, she may need initiation of anticoagulation therapy.  Presently she is on aspirin.  She will follow-up with Dr. Ellyn Hack.  If she develops recurrent episodes of atrial flutter.  EP evaluation for consideration of ablation may be recommended.  Troy Sine, MD, [facility] 01/04/2016 8:58 PM

## 2016-01-04 NOTE — Telephone Encounter (Signed)
New message  Pt is having irregular heartbeat, sweaty, dizziness  Going off and on for a few days  Takes medication, usually feels better, but today has taken 3 and does not feel better,   diltiazen 30mg  1tab 2X daily, flecainide 100mg , 1tab 2xdaily  No other symptoms

## 2016-01-04 NOTE — ED Notes (Signed)
Cardizem drip stopped per Dr.Cardama. Pt ambulatory to restroom with steady gait. Denies palpitations or dizziness

## 2016-01-04 NOTE — ED Triage Notes (Signed)
Patient here with palpitations and HR fast since 0500, complains of diaphoresis with same. No CP, alert and oriented on arrival. Has hx of atrial fib/flutter, SVT

## 2016-01-04 NOTE — ED Notes (Signed)
Attempted vagal maneuver in trendelenburg with no response.

## 2016-01-06 ENCOUNTER — Telehealth: Payer: Self-pay | Admitting: Cardiology

## 2016-01-06 NOTE — Telephone Encounter (Signed)
Closed Encounter  °

## 2016-01-17 ENCOUNTER — Ambulatory Visit (INDEPENDENT_AMBULATORY_CARE_PROVIDER_SITE_OTHER): Payer: 59

## 2016-01-17 DIAGNOSIS — I480 Paroxysmal atrial fibrillation: Secondary | ICD-10-CM

## 2016-01-17 DIAGNOSIS — I471 Supraventricular tachycardia: Secondary | ICD-10-CM

## 2016-01-17 DIAGNOSIS — I484 Atypical atrial flutter: Secondary | ICD-10-CM | POA: Insufficient documentation

## 2016-02-09 ENCOUNTER — Telehealth: Payer: Self-pay | Admitting: *Deleted

## 2016-02-09 ENCOUNTER — Ambulatory Visit (INDEPENDENT_AMBULATORY_CARE_PROVIDER_SITE_OTHER): Payer: 59 | Admitting: Family Medicine

## 2016-02-09 ENCOUNTER — Telehealth: Payer: Self-pay | Admitting: Cardiology

## 2016-02-09 ENCOUNTER — Encounter: Payer: Self-pay | Admitting: Family Medicine

## 2016-02-09 ENCOUNTER — Other Ambulatory Visit (HOSPITAL_COMMUNITY)
Admission: RE | Admit: 2016-02-09 | Discharge: 2016-02-09 | Disposition: A | Discharge status: Home / Self Care | Payer: 59 | Source: Ambulatory Visit | Attending: Family Medicine | Admitting: Family Medicine

## 2016-02-09 VITALS — BP 152/100 | HR 69 | Temp 98.3°F | Ht 63.0 in | Wt 185.2 lb

## 2016-02-09 DIAGNOSIS — I471 Supraventricular tachycardia: Secondary | ICD-10-CM

## 2016-02-09 DIAGNOSIS — Z124 Encounter for screening for malignant neoplasm of cervix: Secondary | ICD-10-CM

## 2016-02-09 DIAGNOSIS — I480 Paroxysmal atrial fibrillation: Secondary | ICD-10-CM

## 2016-02-09 DIAGNOSIS — Z6832 Body mass index (BMI) 32.0-32.9, adult: Secondary | ICD-10-CM

## 2016-02-09 DIAGNOSIS — I4892 Unspecified atrial flutter: Secondary | ICD-10-CM | POA: Diagnosis not present

## 2016-02-09 DIAGNOSIS — Z1151 Encounter for screening for human papillomavirus (HPV): Secondary | ICD-10-CM | POA: Insufficient documentation

## 2016-02-09 DIAGNOSIS — Z0000 Encounter for general adult medical examination without abnormal findings: Secondary | ICD-10-CM

## 2016-02-09 DIAGNOSIS — Z01419 Encounter for gynecological examination (general) (routine) without abnormal findings: Secondary | ICD-10-CM | POA: Insufficient documentation

## 2016-02-09 NOTE — Telephone Encounter (Signed)
lmtcb

## 2016-02-09 NOTE — Addendum Note (Signed)
Addended by: Agnes Lawrence on: 02/09/2016 03:43 PM   Modules accepted: Orders

## 2016-02-09 NOTE — Telephone Encounter (Signed)
Pt wearing heart monitor since 01-17-16, has whelps and saw Dr. Colin Benton today who told her to call to see if we had enough data for her to take it off --pls call

## 2016-02-09 NOTE — Patient Instructions (Signed)
BEFORE YOU LEAVE: -recheck BP  -follow up: yearly and as needed -labs  We have ordered labs and a pap smear at this visit. It can take up to 1-2 weeks for results and processing. IF results require follow up or explanation, we will call you with instructions. Clinically stable results will be released to your [facility]. If you have not heard from [place] or cannot find your results in [facility] in 2 weeks please contact our office at [phone].  If you are not yet signed up for [facility], please consider signing up.   We recommend the following healthy lifestyle for LIFE: 1) Small portions.   Tip: eat off of a salad plate instead of a dinner plate.  Tip: if you need more or a snack choose fruits, veggies and/or a handful of nuts or seeds.  2) Eat a healthy clean diet.  * Tip: Avoid (less then 1 serving per week): processed foods, sweets, sweetened drinks, white starches (rice, flour, bread, potatoes, pasta, etc), red meat, fast foods, butter  *Tip: CHOOSE instead   * 5-9 servings per day of fresh or frozen fruits and vegetables (but not corn, potatoes, bananas, canned or dried fruit)   *nuts and seeds, beans   *olives and olive oil   *small portions of lean meats such as fish and white chicken    *small portions of whole grains  3)Get at least 150 minutes of sweaty aerobic exercise per week.  4)Reduce stress - consider counseling, meditation and relaxation to balance other aspects of your life.

## 2016-02-09 NOTE — Progress Notes (Signed)
Pre visit review using our clinic review tool, if applicable. No additional management support is needed unless otherwise documented below in the visit note. 

## 2016-02-09 NOTE — Progress Notes (Signed)
HPI:  Here for CPE: Sees Dr. Ellyn Hack for hx SVT, A. Fib/Flutter on flecainide, diltiazem and asa. Recent episode of A. Flutter w/ RVR treated in the emergency department last month. Had BMP and cbc in ER. Due for pap smear and flu shot. Declined flu shot.  -Diet: variety of foods, balance and well rounded, larger portion sizes  -Exercise: no regular exercise  -Taking folic acid, vitamin D or calcium: no  -Diabetes and Dyslipidemia Screening:  -Hx of HTN: no  -Vaccines: UTD  -pap history: 10/2012 normal with hpv neg  -FDLMP:  -sexual activity: yes, female partner, no new partners  -wants STI testing (Hep C if born [birth year]): no  -FH breast, colon or ovarian ca: see FH Last mammogram: normal mammogram 09/2015 Last colon cancer screening: per pt normal 2009  Breast Ca Risk Assessment: -see family and personal hx  -Alcohol, Tobacco, drug use: see social history  Review of Systems - no fevers, unintentional weight loss, vision loss, hearing loss, chest pain, sob, hemoptysis, melena, hematochezia, hematuria, genital discharge, changing or concerning skin lesions, bleeding, bruising, loc, thoughts of self harm or SI  Past Medical History:  Diagnosis Date  . Abnormal exercise tolerance test 11/24/2012   Did not reach 85% maximum heart rate. No ischemic changes noted at 79%. Notably beta blocker was not held; no arrhythmias noted.  Marland Kitchen Episcleritis of right eye   . H/O echocardiogram OS 20/40   EF 60-65% with mild concentric LVH. Normal wall motion. Grade 1 diastolic dysfunction. Trace aortic regurgitation. No clear-cut evidence of murmur source  . Hot flashes   . PAF (paroxysmal atrial fibrillation) [facility]) July 2014  . Paroxysmal atrial flutter [facility]) August 2014  . Paroxysmal SVT (supraventricular tachycardia) ([place]) 10/06/2012   This may have simply been misdiagnosed A. fib    Past Surgical History:  Procedure Laterality Date  . CESAREAN SECTION    . VARICOSE VEIN SURGERY  1990     Family History  Problem Relation Age of Onset  . Diabetes Mother   . Hypertension Mother   . Stroke Father   . Hypertension Father   . Heart attack Father 55  . Cancer Sister 36    breast    Social History   Social History  . Marital status: Married    Spouse name: N/A  . Number of children: N/A  . Years of education: N/A   Social History Main Topics  . Smoking status: Never Smoker  . Smokeless tobacco: Never Used  . Alcohol use Yes     Comment: couple of glasses of wine daily   . Drug use: No  . Sexual activity: Not Asked   Other Topics Concern  . 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     Current Outpatient Prescriptions:  .  Artificial Tear Solution (SOOTHE HYDRATION OP), Place 1 drop into both eyes daily as needed (dry eyes)., Disp: , Rfl:  .  aspirin EC 81 MG tablet, Take 81 mg by mouth daily., Disp: , Rfl:  .  CALCIUM PO, Take 1 tablet by mouth daily., Disp: , Rfl:  .  cholecalciferol (VITAMIN D) 400 units TABS tablet, Take 400 Units by mouth daily., Disp: , Rfl:  .  diltiazem (CARDIZEM CD) 120 MG 24 hr capsule, Take 1 capsule (120 mg total) by mouth daily., Disp: 30 capsule, Rfl: 11 .  flecainide (TAMBOCOR) 100 MG tablet, Take 1 tablet by mouth two  times daily, Disp: 180 tablet, Rfl: 2 .  Ibuprofen (ADVIL PO), Take 200 mg by mouth every 6 (six) hours as needed (pain). , Disp: , Rfl:   EXAM:  Vitals:   02/09/16 1504  BP: (!) 160/90  Pulse: 69  Temp: 98.3 F (36.8 C)   Body mass index is 32.81 kg/m. GENERAL: vitals reviewed and listed below, alert, oriented, appears well hydrated and in no acute distress  HEENT: head atraumatic, PERRLA, normal appearance of eyes, ears, nose and mouth. moist mucus  membranes.  NECK: supple, no masses or lymphadenopathy  LUNGS: clear to auscultation bilaterally, no rales, rhonchi or wheeze  CV: HRRR, no peripheral edema or cyanosis, normal pedal pulses  ABDOMEN: bowel sounds normal, soft, non tender to palpation, no masses, no rebound or guarding  SKIN: no rash or abnormal lesions  BREAST: normal appearance - no skin lesions or discharge noted on inspection of both breasts, on palpation of both breast and axillary region no suspicious lesions appreciated today  GU: normal appearance of external genitalia - no lesions or masses appreciated, normal appearing vaginal mucosa - no abnormal discharge, normal appearance of cervix - no lesions or abnormal discharge observed  RECTAL: deferred  MS: normal gait, moves all extremities normally  NEURO: normal gait, speech and thought processing grossly intact, muscle tone grossly intact throughout  PSYCH: normal affect, pleasant and cooperative  ASSESSMENT AND PLAN:  Discussed the following assessment and plan:  Encounter for preventive health examination - Plan: Cholesterol, Total, HDL cholesterol, Hemoglobin A1c, Hep C Antibody  Paroxysmal SVT (supraventricular tachycardia) (HCC) PAF (paroxysmal atrial fibrillation) (HCC) Atrial flutter with rapid ventricular response (HCC)  BMI 32.0-32.9,adult  -pap obtained -declined flu shot -advised follow up with cardiologist  - wearing holter monitor currently -Discussed and advised all [place] preventive services health task force level A and B recommendations for age, sex and risks. -Advised at least 150 minutes of exercise per week and a healthy diet  -FASTING labs, studies and vaccines per orders this encounter  Orders Placed This Encounter  Procedures  . Cholesterol, Total  . HDL cholesterol  . Hemoglobin A1c  . Hep C Antibody   Patient advised to return to clinic immediately if symptoms worsen or persist or new concerns.  Patient Instructions   BEFORE YOU LEAVE: -recheck BP  -follow up: yearly and as needed -labs  We have ordered labs and a pap smear at this visit. It can take up to 1-2 weeks for results and processing. IF results require follow up or explanation, we will call you with instructions. Clinically stable results will be released to your [facility]. If you have not heard from [place] or cannot find your results in [facility] in 2 weeks please contact our office at [phone].  If you are not yet signed up for [facility], please consider signing up.   We recommend the following healthy lifestyle for LIFE: 1) Small portions.   Tip: eat off of a salad plate instead of a dinner plate.  Tip: if you need more or a snack choose fruits, veggies and/or a handful of nuts or seeds.  2) Eat a healthy clean diet.  * Tip: Avoid (less then 1 serving per week): processed foods, sweets, sweetened drinks, white starches (rice, flour, bread, potatoes, pasta, etc), red  meat, fast foods, butter  *Tip: CHOOSE instead   * 5-9 servings per day of fresh or frozen fruits and vegetables (but not corn, potatoes, bananas, canned or dried fruit)   *nuts and seeds, beans   *olives and olive oil   *small portions of lean meats such as fish and white chicken    *small portions of whole grains  3)Get at least 150 minutes of sweaty aerobic exercise per week.  4)Reduce stress - consider counseling, meditation and relaxation to balance other aspects of your life.            No Follow-up on file.  Colin Benton R., DO

## 2016-02-09 NOTE — Telephone Encounter (Signed)
Per Dr Maudie Mercury I rechecked her blood pressure as the pt was awaiting lab tests and the reading was 152/100.  Dr Maudie Mercury was informed of this and advised the pt could see her again today to discuss this and await her appt with the cardiologist on 12/19.  Patient stated she would prefer to see the cardiologist and this information was sent to Dr Maudie Mercury.

## 2016-02-10 LAB — HDL CHOLESTEROL: HDL: 74.1 mg/dL (ref >39.00)

## 2016-02-10 LAB — HEMOGLOBIN A1C: HEMOGLOBIN A1C: 5.4 % (ref 4.6–6.5)

## 2016-02-10 LAB — HEPATITIS C ANTIBODY: HCV AB: NEGATIVE

## 2016-02-10 LAB — CHOLESTEROL, TOTAL: Cholesterol: 202 mg/dL — ABNORMAL HIGH (ref 0–200)

## 2016-02-10 NOTE — Telephone Encounter (Signed)
Discussed care for electrode site with pt she will try and see how it works. Notified to contact [facility] if she decides to remove and send back early.

## 2016-02-14 LAB — CYTOLOGY - PAP
DIAGNOSIS: NEGATIVE
HPV: NOT DETECTED

## 2016-02-21 ENCOUNTER — Encounter: Payer: Self-pay | Admitting: Cardiology

## 2016-02-21 ENCOUNTER — Ambulatory Visit (INDEPENDENT_AMBULATORY_CARE_PROVIDER_SITE_OTHER): Payer: 59 | Admitting: Cardiology

## 2016-02-21 VITALS — BP 160/86 | HR 63 | Ht 63.0 in | Wt 182.2 lb

## 2016-02-21 DIAGNOSIS — I4589 Other specified conduction disorders: Secondary | ICD-10-CM | POA: Diagnosis not present

## 2016-02-21 DIAGNOSIS — I10 Essential (primary) hypertension: Secondary | ICD-10-CM

## 2016-02-21 DIAGNOSIS — I480 Paroxysmal atrial fibrillation: Secondary | ICD-10-CM | POA: Diagnosis not present

## 2016-02-21 DIAGNOSIS — Z79899 Other long term (current) drug therapy: Secondary | ICD-10-CM | POA: Diagnosis not present

## 2016-02-21 MED ORDER — LOSARTAN POTASSIUM 25 MG PO TABS: 25.0000 mg | ORAL_TABLET | Freq: Every day | ORAL | 3 refills | Status: DC

## 2016-02-21 MED ORDER — DILTIAZEM HCL ER COATED BEADS 120 MG PO CP24: 120.0000 mg | ORAL_CAPSULE | Freq: Every day | ORAL | 3 refills | Status: DC

## 2016-02-21 NOTE — Patient Instructions (Addendum)
MEDICATION CHANGES   LOSARTAN 25 MG ONE TABLET DAILY  REFILL 90 SUPPLY   IN ONE WEEK HAVE  BMP - ANYTIME A DAY.  Your physician recommends that you schedule a follow-up appointment in BLOOD PRESSURE CHECK IN 1 ONE MONTH WITH NURSE VISIT.  Your physician wants you to follow-up in [place].You will receive a reminder letter in the mail two months in advance. If you don't receive a letter, please call our office to schedule the follow-up appointment.   If you need a refill on your cardiac medications before your next appointment, please call your pharmacy.

## 2016-02-23 ENCOUNTER — Encounter: Payer: Self-pay | Admitting: Cardiology

## 2016-02-23 ENCOUNTER — Other Ambulatory Visit: Payer: Self-pay

## 2016-02-23 DIAGNOSIS — I10 Essential (primary) hypertension: Secondary | ICD-10-CM | POA: Insufficient documentation

## 2016-02-23 MED ORDER — DILTIAZEM HCL ER COATED BEADS 120 MG PO CP24: 120.0000 mg | ORAL_CAPSULE | Freq: Every day | ORAL | 3 refills | Status: DC

## 2016-02-23 NOTE — Assessment & Plan Note (Signed)
Now back in sinus rhythm. She has been doing quite well with a flecainide and diltiazem combination. Stressed the importance of hydration.  We'll continue with the current diltiazem 120 mg as well as flecainide 100 mg twice a day. For breakthrough: Would take a total of 300 flecainide 1, then 200 mg at the next scheduled dose. Also take additional dose of diltiazem. She remains on aspirin and not on Xarelto because of her risk score of 1.

## 2016-02-23 NOTE — Progress Notes (Signed)
PCP: Veronica Kern., DO  Clinic Note: Chief Complaint  Patient presents with  . Follow-up    Atrial fibrillation/flutter; hypertension    HPI: Veronica Valencia is a 60 y.o. female with a PMH below who presents today for Hospital follow-up (it also happens to be her 6 month follow-up time frame). She has a history of various different arrhythmias including atrial flutter/fibrillation and possible PSVT. Probably be PSVT is actually atrial flutter with one-to-one. We'll in the fall of 2016 she had A. fib RVR and had TEE cardioversion scheduled, but she cardioverted with increased dose of flecainide. She was then put back on her milligrams twice a day flecainide along with her diltiazem.  Veronica Valencia was last seen in May of this year. She had been noticing some fatigue, and we agreed to varus her diltiazem dose down to short acting 30 mg twice a day dosing. We have already stopped her beta blocker. Unfortunately, she has had a breakthrough A. fib RVR spell since.  Recent Hospitalizations: She was in the emergency room on November 1 with rapid irregular heartbeats that did not improve with having taken an increased dose of flecainide. Unfortunately she only took 200 mg once. Apparently this is also given by the fact that she had been probably not hydrating as well as she should. She was given a bolus of diltiazem both orally and IV. She was seen in consult by Dr. Claiborne Billings -->She had already converted to sinus rhythm by the time cardiology arrived. She was then put back on the long-acting diltiazem 120 mg daily. He also recommended polysomnogram. They discussed the possibility of, but did not start anticoagulation.  Studies Reviewed:   She had a cardiac event monitor that she wore from November 14 to December 13 that showed essentially sinus rhythm. There was occasional sinus tachycardia with artifact, but no A. fib recurrence.  Interval History: Veronica Valencia presents today feeling quite well without any major  complaints. She feels a little silly about having asked to reduce her dose of diltiazem in the past, and is feeling fine without any significant fatigue on the current dose of diltiazem. Since her ER visit she's been doing fine with no recurrent rapid irregular heartbeat/palpitations.  Cardiology review of symptoms as follows: No chest pain or shortness of breath with rest or exertion. No PND, orthopnea or edema. No , lightheadedness, dizziness, weakness or syncope/near syncope. No TIA/amaurosis fugax symptoms. No melena, hematochezia, hematuria, or epstaxis. No claudication.  ROS: A comprehensive was performed. Review of Systems  Constitutional: Negative.   Respiratory: Negative.   Gastrointestinal: Negative.   Genitourinary: Negative.   Musculoskeletal: Negative.   Psychiatric/Behavioral: Negative.   All other systems reviewed and are negative.  Past Medical History:  Diagnosis Date  . Abnormal exercise tolerance test 11/24/2012   Did not reach 85% maximum heart rate. No ischemic changes noted at 79%. Notably beta blocker was not held; no arrhythmias noted.  Marland Kitchen Episcleritis of right eye   . H/O echocardiogram OS 20/40   EF 60-65% with mild concentric LVH. Normal wall motion. Grade 1 diastolic dysfunction. Trace aortic regurgitation. No clear-cut evidence of murmur source  . Hot flashes   . PAF (paroxysmal atrial fibrillation) [facility]) July 2014  . Paroxysmal atrial flutter [facility]) August 2014  . Paroxysmal SVT (supraventricular tachycardia) ([place]) 10/06/2012   This may have simply been misdiagnosed A. fib    Past Surgical History:  Procedure Laterality Date  . CESAREAN SECTION    . VARICOSE VEIN SURGERY  1990    Current Meds  Medication Sig  . Artificial Tear Solution (SOOTHE HYDRATION OP) Place 1 drop into both eyes daily as needed (dry eyes).  Marland Kitchen aspirin EC 81 MG tablet Take 81 mg by mouth daily.  Marland Kitchen CALCIUM PO Take 1 tablet by mouth daily.  . cholecalciferol (VITAMIN D) 400  units TABS tablet Take 400 Units by mouth daily.  . diphenhydrAMINE (BENADRYL) 25 MG tablet Take 25 mg by mouth at bedtime.  . flecainide (TAMBOCOR) 100 MG tablet Take 1 tablet by mouth two  times daily  . Ibuprofen (ADVIL PO) Take 200 mg by mouth every 6 (six) hours as needed (pain).   . [DISCONTINUED] diltiazem (CARDIZEM CD) 120 MG 24 hr capsule Take 1 capsule (120 mg total) by mouth daily.  . [DISCONTINUED] diltiazem (CARDIZEM CD) 120 MG 24 hr capsule Take 1 capsule (120 mg total) by mouth daily.  . [DISCONTINUED] diltiazem (CARDIZEM CD) 120 MG 24 hr capsule Take 1 capsule (120 mg total) by mouth daily.    No Known Allergies  Social History   Social History  . Marital status: Married    Spouse name: N/A  . Number of children: N/A  . Years of education: N/A   Social History Main Topics  . Smoking status: Never Smoker  . Smokeless tobacco: Never Used  . Alcohol use Yes     Comment: couple of glasses of wine daily   . Drug use: No  . Sexual activity: Not Asked   Other Topics Concern  . 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    family history includes Cancer (age of onset: 49) in her sister; Diabetes in her mother; Heart attack (age of onset: 61) in her father; Hypertension in her father and mother; Stroke in her father.  Wt Readings from Last 3 Encounters:  02/21/16 82.6 kg (182 lb 3.2 oz)  02/09/16 84 kg (185 lb 3.2 oz)  01/04/16 81.2 kg (179 lb)    PHYSICAL EXAM BP (!) 160/86   Pulse 63   Ht 5\' 3"  (1.6 m)   Wt 82.6 kg (182 lb 3.2 oz)   BMI 32.28 kg/m  General appearance: alert, cooperative, appears stated age, no distress and mildly obese Neck: no adenopathy, no carotid bruit and no JVD HEENT: [place]/AT, EOMI, MMM, anicteric  sclera Lungs: CTAB, normal percussion bilaterally and non-labored Heart: RRR ; S1, S2 normal, soft 1-2/6 SEM at RUSB; no click, rub or gallop; nondisplaced PMI Abdomen: soft, non-tender; bowel sounds normal; no masses, no organomegaly; Extremities: extremities normal, atraumatic, no cyanosis, and no edema; Pulses: 2+ and symmetric;  Neurologic: Mental status: Alert, oriented, thought content appropriate; Cranial nerves: II-XII grossly intact Psych: normal/pleasant mood and affect.    Adult ECG Report  Rate: 63 ;  Rhythm: normal sinus rhythm and Left axis deviation (-63). Otherwise normal intervals and durations.;   Narrative Interpretation: Relatively stable EKG.  Other studies Reviewed: Additional studies/ records that were reviewed today include:  Recent Labs:   Lab Results  Component Value Date   CREATININE 0.85 01/04/2016   BUN 13 01/04/2016   NA 136 01/04/2016   K 3.9 01/04/2016   CL 101 01/04/2016   CO2 22  01/04/2016    ASSESSMENT / PLAN: Problem List Items Addressed This Visit    PAF (paroxysmal atrial fibrillation) ([place]); CHA2DS2Vasc =1 (not on AC) - Primary (Chronic)    Now back in sinus rhythm. She has been doing quite well with a flecainide and diltiazem combination. Stressed the importance of hydration.  We'll continue with the current diltiazem 120 mg as well as flecainide 100 mg twice a day. For breakthrough: Would take a total of 300 flecainide 1, then 200 mg at the next scheduled dose. Also take additional dose of diltiazem. She remains on aspirin and not on Xarelto because of her risk score of 1.      Relevant Medications   losartan (COZAAR) 25 MG tablet   Other Relevant Orders   Basic metabolic panel   EKG XX123456 (Completed)   Chronotropic incompetence with beta blockers (Chronic)    Much better off beta blocker and on diltiazem.      Essential hypertension (Chronic)    Several readings in a row of elevated blood pressures. This point think we can  give her diagnosis of hypertension. We'll start low-dose ARB would losartan 25 mg. There is added benefit of ARB's in patients with A. Fib.  -We will repeat blood pressure check in about 1 week with a nurse visit and have her check a BMP to assess renal function and potassium      Relevant Medications   losartan (COZAAR) 25 MG tablet   Other Relevant Orders   Basic metabolic panel   EKG XX123456 (Completed)   Medication management   Relevant Orders   Basic metabolic panel   EKG XX123456 (Completed)      Current medicines are reviewed at length with the patient today. (+/- concerns) How should she do her when necessary diltiazem and flecainide for breakthroughs. The following changes have been made: See below  Patient Instructions  MEDICATION CHANGES   LOSARTAN 25 MG ONE TABLET DAILY  REFILL 90 SUPPLY      IN ONE WEEK HAVE  BMP - ANYTIME A DAY.    Your physician recommends that you schedule a follow-up appointment in BLOOD PRESSURE CHECK IN 1 ONE MONTH WITH NURSE VISIT.   Your physician wants you to follow-up in [place].You will receive a reminder letter in the mail two months in advance. If you don't receive a letter, please call our office to schedule the follow-up appointment.   If you need a refill on your cardiac medications before your next appointment, please call your pharmacy.    Studies Ordered:   Orders Placed This Encounter  Procedures  . Basic metabolic panel  . EKG 12-Lead      Glenetta Hew, M.D., M.S. Interventional Cardiologist   Pager # [phone] Phone # [phone] [address] [place], [place] [address]

## 2016-02-23 NOTE — Assessment & Plan Note (Signed)
Much better off beta blocker and on diltiazem.

## 2016-02-23 NOTE — Assessment & Plan Note (Signed)
Several readings in a row of elevated blood pressures. This point think we can give her diagnosis of hypertension. We'll start low-dose ARB would losartan 25 mg. There is added benefit of ARB's in patients with A. Fib.  -We will repeat blood pressure check in about 1 week with a nurse visit and have her check a BMP to assess renal function and potassium

## 2016-02-28 ENCOUNTER — Other Ambulatory Visit: Payer: Self-pay

## 2016-02-28 MED ORDER — DILTIAZEM HCL ER COATED BEADS 120 MG PO CP24: 120.0000 mg | ORAL_CAPSULE | Freq: Every day | ORAL | 3 refills | Status: DC

## 2016-02-29 ENCOUNTER — Telehealth: Payer: Self-pay | Admitting: Cardiology

## 2016-02-29 NOTE — Telephone Encounter (Signed)
Returned call-verified correct rx (per OV note and current medication list).  Verbalized understanding.

## 2016-02-29 NOTE — Telephone Encounter (Signed)
May ([facility] ) to clarify a prescription for Cardizem 60mg  and want to know if it has ben changed to [place] 120mg  . Please refer to  Reference [id number]. Thanks

## 2016-03-19 ENCOUNTER — Ambulatory Visit (INDEPENDENT_AMBULATORY_CARE_PROVIDER_SITE_OTHER): Payer: 59 | Admitting: Family Medicine

## 2016-03-19 ENCOUNTER — Encounter: Payer: Self-pay | Admitting: Family Medicine

## 2016-03-19 VITALS — BP 126/88 | HR 65 | Temp 98.0°F | Ht 63.0 in | Wt 180.4 lb

## 2016-03-19 DIAGNOSIS — R590 Localized enlarged lymph nodes: Secondary | ICD-10-CM

## 2016-03-19 DIAGNOSIS — R52 Pain, unspecified: Secondary | ICD-10-CM | POA: Diagnosis not present

## 2016-03-19 DIAGNOSIS — J029 Acute pharyngitis, unspecified: Secondary | ICD-10-CM

## 2016-03-19 MED ORDER — AMOXICILLIN 500 MG PO CAPS: 500.0000 mg | ORAL_CAPSULE | Freq: Two times a day (BID) | ORAL | 0 refills | Status: DC

## 2016-03-19 NOTE — Progress Notes (Signed)
HPI:  Sore throat, swollen glands, body aches: -started: about 1 week ago -symptoms:had upper resp illness, ? Fevers 1 week ago, now with soreness both sides of the neck, back, sore throat and swollen glands in the neck -denies:SOB, NVD, tooth pain -has tried: ibuprofen -sick contacts/travel/risks: no reported flu, strep or tick exposure  ROS: See pertinent positives and negatives per HPI.  Past Medical History:  Diagnosis Date  . Abnormal exercise tolerance test 11/24/2012   Did not reach 85% maximum heart rate. No ischemic changes noted at 79%. Notably beta blocker was not held; no arrhythmias noted.  Marland Kitchen Episcleritis of right eye   . H/O echocardiogram OS 20/40   EF 60-65% with mild concentric LVH. Normal wall motion. Grade 1 diastolic dysfunction. Trace aortic regurgitation. No clear-cut evidence of murmur source  . Hot flashes   . PAF (paroxysmal atrial fibrillation) [facility]) July 2014  . Paroxysmal atrial flutter [facility]) August 2014  . Paroxysmal SVT (supraventricular tachycardia) ([place]) 10/06/2012   This may have simply been misdiagnosed A. fib    Past Surgical History:  Procedure Laterality Date  . CESAREAN SECTION    . VARICOSE VEIN SURGERY  1990    Family History  Problem Relation Age of Onset  . Diabetes Mother   . Hypertension Mother   . Stroke Father   . Hypertension Father   . Heart attack Father 49  . Cancer Sister 16    breast    Social History   Social History  . Marital status: Married    Spouse name: N/A  . Number of children: N/A  . Years of education: N/A   Social History Main Topics  . Smoking status: Never Smoker  . Smokeless tobacco: Never Used  . Alcohol use Yes     Comment: couple of glasses of wine daily   . Drug use: No  . Sexual activity: Not Asked   Other Topics Concern  . 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     Current Outpatient Prescriptions:  .  Artificial Tear Solution (SOOTHE HYDRATION OP), Place 1 drop into both eyes daily as needed (dry eyes)., Disp: , Rfl:  .  aspirin EC 81 MG tablet, Take 81 mg by mouth daily., Disp: , Rfl:  .  CALCIUM PO, Take 1 tablet by mouth daily., Disp: , Rfl:  .  cholecalciferol (VITAMIN D) 400 units TABS tablet, Take 400 Units by mouth daily., Disp: , Rfl:  .  diltiazem (CARDIZEM CD) 120 MG 24 hr capsule, Take 1 capsule (120 mg total) by mouth daily., Disp: 90 capsule, Rfl: 3 .  diphenhydrAMINE (BENADRYL) 25 MG tablet, Take 25 mg by mouth at bedtime., Disp: , Rfl:  .  flecainide (TAMBOCOR) 100 MG tablet, Take 1 tablet by mouth two  times daily, Disp: 180 tablet, Rfl: 2 .  Ibuprofen (ADVIL PO), Take 200 mg by mouth every 6 (six) hours as needed (pain). , Disp: , Rfl:  .  losartan (COZAAR) 25 MG tablet, Take 1 tablet (25 mg total) by mouth daily., Disp: 90 tablet, Rfl: 3 .  amoxicillin (AMOXIL) 500 MG capsule, Take 1 capsule (500 mg total) by mouth 2 (two) times daily., Disp: 20  capsule, Rfl: 0  EXAM:  Vitals:   03/19/16 1315  BP: 126/88  Pulse: 65  Temp: 98 F (36.7 C)    Body mass index is 31.96 kg/m.  GENERAL: vitals reviewed and listed above, alert, oriented, appears well hydrated and in no acute distress  HEENT: atraumatic, conjunttiva clear, no obvious abnormalities on inspection of external nose and ears, normal appearance of ear canals and TMs, white nasal congestion, mild post oropharyngeal erythema with PND, no tonsillar edema or exudate, no sinus TTP  NECK: bilat mild ant cervical LAD  LUNGS: clear to auscultation bilaterally, no wheezes, rales or rhonchi, good air movement  CV: HRRR, no peripheral edema  MS: moves all extremities without noticeable abnormality  PSYCH: pleasant and cooperative, no obvious depression  or anxiety  ASSESSMENT AND PLAN:  Discussed the following assessment and plan:  LAD (lymphadenopathy), cervical  Sore throat  Body aches  -given HPI and exam findings today, a serious infection or illness is unlikely. We discussed potential etiologies, strep throat or influenza/VURI being most likely, and advised supportive care and monitoring. We opted for rapid strep/flu. Amox if neg with strep culture pending as she is very anxious about the lad and potential bacterial illness. Discussed treatment side effects, likely course, antibiotic misuse, transmission, and signs of developing a serious illness. -of course, we advised to return or notify a doctor immediately if symptoms worsen or persist or new concerns arise.    Patient Instructions  BEFORE YOU LEAVE: -rapid flu and strep, strep culture  Start the amoxicillin.  Please follow up if symptoms worsen or persist.  I hope you are feeling better soon!   Colin Benton R., DO

## 2016-03-19 NOTE — Patient Instructions (Signed)
BEFORE YOU LEAVE: -rapid flu and strep, strep culture  Start the amoxicillin.  Please follow up if symptoms worsen or persist.  I hope you are feeling better soon!

## 2016-03-19 NOTE — Progress Notes (Signed)
Pre visit review using our clinic review tool, if applicable. No additional management support is needed unless otherwise documented below in the visit note. 

## 2016-03-19 NOTE — Addendum Note (Signed)
Addended by: Agnes Lawrence on: 03/19/2016 03:32 PM   Modules accepted: Orders

## 2016-03-21 LAB — CULTURE, GROUP A STREP: ORGANISM ID, BACTERIA: NORMAL

## 2016-03-27 ENCOUNTER — Ambulatory Visit (INDEPENDENT_AMBULATORY_CARE_PROVIDER_SITE_OTHER): Payer: 59

## 2016-03-27 VITALS — BP 144/94 | HR 62 | Ht 63.0 in | Wt 182.0 lb

## 2016-03-27 DIAGNOSIS — I10 Essential (primary) hypertension: Secondary | ICD-10-CM | POA: Diagnosis not present

## 2016-03-27 NOTE — Patient Instructions (Signed)
Have Bmet Wed 03/28/16   Continue same medication   Dr.Harding wants to get bmet results before making any medication changes.

## 2016-03-27 NOTE — Progress Notes (Signed)
1.) Reason for visit: B/P check  2.) Name of MD requesting visit: Dr.Harding  3.) H&P: Losartan 25 mg daily started on 02/21/16  4.) ROS related to problem:No complaints Patient will have bmet 03/29/16.  5.) Assessment and plan per MD: Await results of bmet.

## 2016-03-29 LAB — BASIC METABOLIC PANEL
BUN: 12 mg/dL (ref 7–25)
CHLORIDE: 95 mmol/L — AB (ref 98–110)
CO2: 28 mmol/L (ref 20–31)
CREATININE: 0.84 mg/dL (ref 0.50–1.05)
Calcium: 9.4 mg/dL (ref 8.6–10.4)
GLUCOSE: 102 mg/dL — AB (ref 65–99)
POTASSIUM: 4.7 mmol/L (ref 3.5–5.3)
Sodium: 132 mmol/L — ABNORMAL LOW (ref 135–146)

## 2016-04-02 ENCOUNTER — Telehealth: Payer: Self-pay | Admitting: *Deleted

## 2016-04-02 NOTE — Telephone Encounter (Signed)
LEFT MESSAGE TO CALL BACK  IN REGARDS TO LAB RESULTS 

## 2016-04-05 NOTE — Telephone Encounter (Signed)
Spoke to patient. Result given . Verbalized understanding  

## 2016-05-11 ENCOUNTER — Other Ambulatory Visit: Payer: Self-pay | Admitting: Cardiology

## 2016-06-19 ENCOUNTER — Ambulatory Visit: Payer: 59 | Admitting: Family Medicine

## 2016-06-21 ENCOUNTER — Ambulatory Visit (INDEPENDENT_AMBULATORY_CARE_PROVIDER_SITE_OTHER): Payer: 59 | Admitting: Family Medicine

## 2016-06-21 ENCOUNTER — Encounter: Payer: Self-pay | Admitting: Family Medicine

## 2016-06-21 VITALS — BP 118/68 | HR 70 | Temp 97.9°F | Ht 63.0 in | Wt 180.0 lb

## 2016-06-21 DIAGNOSIS — N951 Menopausal and female climacteric states: Secondary | ICD-10-CM | POA: Diagnosis not present

## 2016-06-21 MED ORDER — NORETHINDRONE-ETH ESTRADIOL 1-5 MG-MCG PO TABS: 1.0000 | ORAL_TABLET | Freq: Every day | ORAL | 5 refills | Status: DC

## 2016-06-21 NOTE — Progress Notes (Signed)
HPI:  Acute visit for Hot Flashes: -terrible when hit menopause -was on HRT with Jinteli for 5-6 years and tolerated well w/ excellent response -stopped last year and symptoms recurred -she has horrible hot flashes and want to restart HRT -understands risks and reports " I just don't care" as would rather take these risks then continue to suffer -no hx blood clots, smoking, cancer -BP well controlled -sister had breast Ca  ROS: See pertinent positives and negatives per HPI.  Past Medical History:  Diagnosis Date  . Abnormal exercise tolerance test 11/24/2012   Did not reach 85% maximum heart rate. No ischemic changes noted at 79%. Notably beta blocker was not held; no arrhythmias noted.  Marland Kitchen Episcleritis of right eye   . H/O echocardiogram OS 20/40   EF 60-65% with mild concentric LVH. Normal wall motion. Grade 1 diastolic dysfunction. Trace aortic regurgitation. No clear-cut evidence of murmur source  . Hot flashes   . PAF (paroxysmal atrial fibrillation) [facility]) July 2014  . Paroxysmal atrial flutter [facility]) August 2014  . Paroxysmal SVT (supraventricular tachycardia) ([place]) 10/06/2012   This may have simply been misdiagnosed A. fib    Past Surgical History:  Procedure Laterality Date  . CESAREAN SECTION    . VARICOSE VEIN SURGERY  1990    Family History  Problem Relation Age of Onset  . Diabetes Mother   . Hypertension Mother   . Stroke Father   . Hypertension Father   . Heart attack Father 80  . Cancer Sister 73    breast    Social History   Social History  . Marital status: Married    Spouse name: N/A  . Number of children: N/A  . Years of education: N/A   Social History Main Topics  . Smoking status: Never Smoker  . Smokeless tobacco: Never Used  . Alcohol use Yes     Comment: couple of glasses of wine daily   . Drug use: No  . Sexual activity: Not Asked   Other Topics Concern  . 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     Current Outpatient Prescriptions:  .  Artificial Tear Solution (SOOTHE HYDRATION OP), Place 1 drop into both eyes daily as needed (dry eyes)., Disp: , Rfl:  .  aspirin EC 81 MG tablet, Take 81 mg by mouth daily., Disp: , Rfl:  .  CALCIUM PO, Take 1 tablet by mouth daily., Disp: , Rfl:  .  cholecalciferol (VITAMIN D) 400 units TABS tablet, Take 400 Units by mouth daily., Disp: , Rfl:  .  diltiazem (CARDIZEM CD) 120 MG 24 hr capsule, Take 1 capsule (120 mg total) by mouth daily., Disp: 90 capsule, Rfl: 3 .  diphenhydrAMINE (BENADRYL) 25 MG tablet, Take 25 mg by mouth at bedtime., Disp: , Rfl:  .  flecainide (TAMBOCOR) 100 MG tablet, TAKE 1 TABLET BY MOUTH TWO  TIMES DAILY, Disp: 180 tablet, Rfl: 0 .  Ibuprofen (ADVIL PO), Take 200 mg by mouth every 6 (six) hours as needed (pain). , Disp: , Rfl:  .  losartan (COZAAR) 25 MG tablet, Take 1 tablet (25 mg total) by mouth daily., Disp: 90 tablet, Rfl: 3 .  norethindrone-ethinyl estradiol (  JINTELI) 1-5 MG-MCG TABS tablet, Take 1 tablet by mouth daily., Disp: 28 tablet, Rfl: 5  EXAM:  Vitals:   06/21/16 1432  BP: 118/68  Pulse: 70  Temp: 97.9 F (36.6 C)    Body mass index is 31.89 kg/m.  GENERAL: vitals reviewed and listed above, alert, oriented, appears well hydrated and in no acute distress  HEENT: atraumatic, conjunttiva clear, no obvious abnormalities on inspection of external nose and ears  NECK: no obvious masses on inspection  LUNGS: clear to auscultation bilaterally, no wheezes, rales or rhonchi, good air movement  CV: HRRR, no peripheral edema  MS: moves all extremities without noticeable abnormality  PSYCH: pleasant and cooperative, no obvious depression or anxiety  ASSESSMENT AND PLAN:  Discussed the following  assessment and plan: More than 50% of over 25 minutes spent in total in caring for this patient was spent face-to-face with the patient, counseling and/or coordinating care.   Hot flashes due to menopause  -discussed at length the various risks/potentially serious and life threatening with HRT -she is currently misrable and feels benefit > risks and wishes to restart Jinteli as this was a simple regimen for her - she may try a half tab -f/u 3 months -Patient advised to return or notify a doctor immediately if symptoms worsen or persist or new concerns arise. Declined AVS. There are no Patient Instructions on file for this visit.  Colin Benton R., DO

## 2016-06-21 NOTE — Progress Notes (Signed)
Pre visit review using our clinic review tool, if applicable. No additional management support is needed unless otherwise documented below in the visit note. 

## 2016-08-02 ENCOUNTER — Other Ambulatory Visit: Payer: Self-pay | Admitting: Cardiology

## 2016-08-03 NOTE — Telephone Encounter (Signed)
REFILL 

## 2016-08-22 ENCOUNTER — Encounter: Payer: Self-pay | Admitting: Cardiology

## 2016-08-22 ENCOUNTER — Ambulatory Visit (INDEPENDENT_AMBULATORY_CARE_PROVIDER_SITE_OTHER): Payer: 59 | Admitting: Cardiology

## 2016-08-22 VITALS — BP 120/78 | HR 62 | Ht 63.0 in | Wt 180.4 lb

## 2016-08-22 DIAGNOSIS — I480 Paroxysmal atrial fibrillation: Secondary | ICD-10-CM | POA: Diagnosis not present

## 2016-08-22 DIAGNOSIS — I471 Supraventricular tachycardia, unspecified: Secondary | ICD-10-CM

## 2016-08-22 DIAGNOSIS — I10 Essential (primary) hypertension: Secondary | ICD-10-CM | POA: Diagnosis not present

## 2016-08-22 NOTE — Progress Notes (Signed)
PCP: Lucretia Kern, DO  Clinic Note: Chief Complaint  Patient presents with  . Follow-up    PSVT  . Atrial Fibrillation    /Flutter    HPI: Veronica Valencia is a 61 y.o. female with a PMH below who presents today for six-month follow-up for PSVT along with A. fib/flutter  Veronica Valencia was last seen on 02/21/2016 - mostly for follow-up hospital follow-up for tachycardia. She was given IV bolus diltiazem and was placed on long-acting diltiazem 20 mg daily. Last saw her she was doing quite well and asked about reducing dose of diltiazem. Fatigue with beta blockers. She is on the Xarelto for anticoagulation. I added losartan for hypertension. Follow-up visit blood pressure check was 144/94 mmHg -> follow-up visit with PCP blood pressure was 118/68 mmHg  Recent Hospitalizations:   Studies Personally Reviewed - (if available, images/films reviewed: From Epic Chart or Care Everywhere)  No new studies  Interval History: Veronica Valencia returns today doing quite well. She's not had any further episodes of tachycardia, palpitations ever since we adjusted her medications, she is doing fine. No breakthrough episodes. No lightheadedness or dizziness or wooziness. No syncope/near syncope or TIAs amaurosis fugax.  She remains active without any significant dyspnea or chest discomfort. No recent trips, but has planned trip for this winter going [place]  No chest pain or shortness of breath with rest or exertion. No PND, orthopnea or edema.No claudication.  ROS: A comprehensive was performed. Review of Systems  Constitutional: Negative.  Negative for malaise/fatigue.  HENT: Negative.   Respiratory: Negative.   Cardiovascular:       Per history of present illness  Gastrointestinal: Negative for abdominal pain, blood in stool, melena and nausea.  Genitourinary: Negative for hematuria.  Musculoskeletal: Positive for joint pain (She sprained her left ankle tripping on her carpet).    Neurological: Negative.   Endo/Heme/Allergies: Negative.   Psychiatric/Behavioral: Negative.   All other systems reviewed and are negative.   I have reviewed and (if needed) personally updated the patient's problem list, medications, allergies, past medical and surgical history, social and family history.   Past Medical History:  Diagnosis Date  . Abnormal exercise tolerance test 11/24/2012   Did not reach 85% maximum heart rate. No ischemic changes noted at 79%. Notably beta blocker was not held; no arrhythmias noted.  Marland Kitchen Episcleritis of right eye   . H/O echocardiogram OS 20/40   EF 60-65% with mild concentric LVH. Normal wall motion. Grade 1 diastolic dysfunction. Trace aortic regurgitation. No clear-cut evidence of murmur source  . Hot flashes   . PAF (paroxysmal atrial fibrillation) [facility]) July 2014  . Paroxysmal atrial flutter [facility]) August 2014  . Paroxysmal SVT (supraventricular tachycardia) ([place]) 10/06/2012   This may have simply been misdiagnosed A. fib    Past Surgical History:  Procedure Laterality Date  . CESAREAN SECTION    . VARICOSE VEIN SURGERY  1990    Current Meds  Medication Sig  . Artificial Tear Solution (SOOTHE HYDRATION OP) Place 1 drop into both eyes daily as needed (dry eyes).  Marland Kitchen aspirin EC 81 MG tablet Take 81 mg by mouth daily.  Marland Kitchen CALCIUM PO Take 1 tablet by mouth daily.  . cholecalciferol (VITAMIN D) 400 units TABS tablet Take 400 Units by mouth daily.  Marland Kitchen diltiazem (CARDIZEM CD) 120 MG 24 hr capsule Take 1 capsule (120 mg total) by mouth daily.  . flecainide (TAMBOCOR) 100 MG tablet TAKE 1 TABLET BY MOUTH  TWO  TIMES DAILY  . Ibuprofen (ADVIL PO) Take 200 mg by mouth every 6 (six) hours as needed (pain).   Marland Kitchen losartan (COZAAR) 25 MG tablet Take 1 tablet by mouth daily.  . Melatonin 10 MG CAPS Take 1 capsule by mouth daily.  . norethindrone-ethinyl estradiol (JINTELI) 1-5 MG-MCG TABS tablet Take 1 tablet by mouth daily.    No Known Allergies  Social  History   Social History  . Marital status: Married    Spouse name: N/A  . Number of children: N/A  . Years of education: N/A   Social History Main Topics  . Smoking status: Never Smoker  . Smokeless tobacco: Never Used  . Alcohol use Yes     Comment: couple of glasses of wine daily   . Drug use: No  . Sexual activity: Not Asked   Other Topics Concern  . 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    family history includes Cancer (age of onset: 9) in her sister; Diabetes in her mother; Heart attack (age of onset: 32) in her father; Hypertension in her father and mother; Stroke in her father.  Wt Readings from Last 3 Encounters:  08/22/16 180 lb 6.4 oz (81.8 kg)  06/21/16 180 lb (81.6 kg)  03/27/16 182 lb (82.6 kg)    PHYSICAL EXAM BP 120/78   Pulse 62   Ht 5\' 3"  (1.6 m)   Wt 180 lb 6.4 oz (81.8 kg)   BMI 31.96 kg/m  General appearance: alert, cooperative, appears stated age, no distress. Mildly obese HEENT: [place]/AT, EOMI, MMM, anicteric sclera Neck: no adenopathy, no carotid bruit and no JVD Lungs: clear to auscultation bilaterally, normal percussion bilaterally and non-labored Heart: regular rate and rhythm, S1 &S2 normal, 1-2/6 SEM at RUSB. No click, rub or gallop; nondisplaced PMI Abdomen: soft, non-tender; bowel sounds normal; no masses,  no organomegaly; no HJR Extremities: extremities normal, atraumatic, no cyanosis, or edema Pulses: 2+ and symmetric;  Skin: mobility and turgor normal, no edema, no evidence of bleeding or bruising and no lesions noted  Neurologic: Mental status: Alert & oriented x 3, thought content appropriate; non-focal exam.  Pleasant mood & affect.    Adult ECG Report  Rate: 62 ;  Rhythm: normal sinus  rhythm and Normal axis, intervals and durations;   Narrative Interpretation: Normal EKG   Other studies Reviewed: Additional studies/ records that were reviewed today include:  Recent Labs:   Lab Results  Component Value Date   CREATININE 0.84 03/28/2016   BUN 12 03/28/2016   NA 132 (L) 03/28/2016   K 4.7 03/28/2016   CL 95 (L) 03/28/2016   CO2 28 03/28/2016    Lab Results  Component Value Date   CHOL 202 (H) 02/09/2016   HDL 74.10 02/09/2016   LDLCALC 102 (H) 05/07/2014   TRIG 72.0 05/07/2014   CHOLHDL 3 05/07/2014    ASSESSMENT / PLAN: Problem List Items Addressed This Visit    Essential hypertension (Chronic)    Much better controlled on ARB.      Relevant Medications   losartan (COZAAR) 25 MG tablet   PAF (paroxysmal atrial fibrillation) ([place]); CHA2DS2Vasc =1 (not on AC) - Primary (Chronic)  Maintaining sinus rhythm. On flecainide for rhythm control diltiazem for rate control. Portions of hydration. Also discussed when necessary use of 200 mg flecainide plus additional dose of diltiazem for breakthrough.  At present - CHA2DS2-VASc Score  score = 1 - continue ASA. Especially given the infrequency of episodes while on Flecainide.  This patients CHA2DS2-VASc Score and unadjusted Ischemic Stroke Rate (% per year) is equal to 0.6 % stroke rate/year from a score of 1  Above score calculated as 1 point each if present [CHF, HTN, DM, Vascular=MI/PAD/Aortic Plaque, Age if 65-74, or Female] Above score calculated as 2 points each if present [Age > 75, or Stroke/TIA/TE]       Relevant Medications   losartan (COZAAR) 25 MG tablet   Other Relevant Orders   EKG 12-Lead   Paroxysmal SVT (supraventricular tachycardia) ([facility]) (Chronic)    No further recurrences. She is on stable dose of diltiazem and flecainide. Avoiding triggers      Relevant Medications   losartan (COZAAR) 25 MG tablet   Other Relevant Orders   EKG 12-Lead      Current medicines are reviewed at  length with the patient today. (+/- concerns) None The following changes have been made: None  Patient Instructions  NO CHANGE WITH CURRENT MEDICATION     CONTINUE WITH MEDICATION  IF YOU HAVE BREAK THROUGH FAST HEATRATE-- USE FLECAINIDE AND DILTIAZEM      Your physician wants you to follow-up in [place].You will receive a reminder letter in the mail two months in advance. If you don't receive a letter, please call our office to schedule the follow-up appointment.   If you need a refill on your cardiac medications before your next appointment, please call your pharmacy.    Studies Ordered:   Orders Placed This Encounter  Procedures  . EKG 12-Lead      Glenetta Hew, M.D., M.S. Interventional Cardiologist   Pager # [phone] Phone # [phone] [address] [place], [place] [address]

## 2016-08-22 NOTE — Assessment & Plan Note (Signed)
Much better controlled on ARB.

## 2016-08-22 NOTE — Patient Instructions (Signed)
NO CHANGE WITH CURRENT MEDICATION     CONTINUE WITH MEDICATION  IF YOU HAVE BREAK THROUGH FAST HEATRATE-- USE FLECAINIDE AND DILTIAZEM      Your physician wants you to follow-up in [place].You will receive a reminder letter in the mail two months in advance. If you don't receive a letter, please call our office to schedule the follow-up appointment.   If you need a refill on your cardiac medications before your next appointment, please call your pharmacy.

## 2016-08-22 NOTE — Assessment & Plan Note (Signed)
Maintaining sinus rhythm. On flecainide for rhythm control diltiazem for rate control. Portions of hydration. Also discussed when necessary use of 200 mg flecainide plus additional dose of diltiazem for breakthrough.  At present - CHA2DS2-VASc Score  score = 1 - continue ASA. Especially given the infrequency of episodes while on Flecainide.  This patients CHA2DS2-VASc Score and unadjusted Ischemic Stroke Rate (% per year) is equal to 0.6 % stroke rate/year from a score of 1  Above score calculated as 1 point each if present [CHF, HTN, DM, Vascular=MI/PAD/Aortic Plaque, Age if 65-74, or Female] Above score calculated as 2 points each if present [Age > 75, or Stroke/TIA/TE]

## 2016-08-22 NOTE — Assessment & Plan Note (Signed)
No further recurrences. She is on stable dose of diltiazem and flecainide. Avoiding triggers

## 2016-09-07 ENCOUNTER — Other Ambulatory Visit: Payer: Self-pay | Admitting: Family Medicine

## 2016-09-07 DIAGNOSIS — Z1231 Encounter for screening mammogram for malignant neoplasm of breast: Secondary | ICD-10-CM

## 2016-09-20 ENCOUNTER — Ambulatory Visit: Payer: 59 | Admitting: Family Medicine

## 2016-10-04 ENCOUNTER — Ambulatory Visit (INDEPENDENT_AMBULATORY_CARE_PROVIDER_SITE_OTHER): Payer: 59 | Admitting: Family Medicine

## 2016-10-04 VITALS — BP 118/80 | HR 66 | Temp 98.2°F | Ht 63.0 in | Wt 178.6 lb

## 2016-10-04 DIAGNOSIS — R232 Flushing: Secondary | ICD-10-CM

## 2016-10-04 DIAGNOSIS — T148XXA Other injury of unspecified body region, initial encounter: Secondary | ICD-10-CM | POA: Diagnosis not present

## 2016-10-04 DIAGNOSIS — R05 Cough: Secondary | ICD-10-CM

## 2016-10-04 DIAGNOSIS — R059 Cough, unspecified: Secondary | ICD-10-CM

## 2016-10-04 DIAGNOSIS — J40 Bronchitis, not specified as acute or chronic: Secondary | ICD-10-CM | POA: Diagnosis not present

## 2016-10-04 MED ORDER — AMOXICILLIN-POT CLAVULANATE 875-125 MG PO TABS: 1.0000 | ORAL_TABLET | Freq: Two times a day (BID) | ORAL | 0 refills | Status: DC

## 2016-10-04 MED ORDER — FLUTICASONE PROPIONATE HFA 44 MCG/ACT IN AERO: 1.0000 | INHALATION_SPRAY | Freq: Two times a day (BID) | RESPIRATORY_TRACT | 0 refills | Status: DC

## 2016-10-04 NOTE — Progress Notes (Signed)
HPI:  Past medical history SVT, atrial fibrillation/flutter, hypertension and hot flashes here for follow-up. She sees her cardiologist on a regular basis. She restarted in HRT for her hot flashes which were severely impacting her quality of life. Reports hot flashes resolved and is so grateful to be back on hormones. The last month has been tough. Her brother-in-law passed away suddenly and a freak drowning incident. She was living with her sister for a few weeks to help her out Vertell Limber is important health and is of order. She developed a viral upper respiratory incident about 4 weeks ago and then was exposed to a lot of animal hair, dust and mold in her sister's house and her cough has persisted. At times she has felt like she had mild wheezing. Denies fevers, shortness of breath, nausea, vomiting, weight loss, hemoptysis. She also was bitten by one of her sister's dogs on the thumb. She did develop swelling and redness at the bite site. This is now healed but still has some tenderness. Reports that she is up-to-date on her tetanus booster and that the dogs are up to date on all of their shots and rabies vaccines.  ROS: See pertinent positives and negatives per HPI.  Past Medical History:  Diagnosis Date  . Abnormal exercise tolerance test 11/24/2012   Did not reach 85% maximum heart rate. No ischemic changes noted at 79%. Notably beta blocker was not held; no arrhythmias noted.  Marland Kitchen Episcleritis of right eye   . H/O echocardiogram OS 20/40   EF 60-65% with mild concentric LVH. Normal wall motion. Grade 1 diastolic dysfunction. Trace aortic regurgitation. No clear-cut evidence of murmur source  . Hot flashes   . PAF (paroxysmal atrial fibrillation) [facility]) July 2014  . Paroxysmal atrial flutter [facility]) August 2014  . Paroxysmal SVT (supraventricular tachycardia) ([place]) 10/06/2012   This may have simply been misdiagnosed A. fib    Past Surgical History:  Procedure Laterality Date  . CESAREAN SECTION     . VARICOSE VEIN SURGERY  1990    Family History  Problem Relation Age of Onset  . Diabetes Mother   . Hypertension Mother   . Stroke Father   . Hypertension Father   . Heart attack Father 55  . Cancer Sister 69       breast    Social History   Social History  . Marital status: Married    Spouse name: N/A  . Number of children: N/A  . Years of education: N/A   Social History Main Topics  . Smoking status: Never Smoker  . Smokeless tobacco: Never Used  . Alcohol use Yes     Comment: couple of glasses of wine daily   . Drug use: No  . Sexual activity: Not on file   Other Topics Concern  . Not on file   Social History Narrative   She is a married mother of 3.    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     Current Outpatient Prescriptions:  .  Artificial Tear Solution (SOOTHE HYDRATION OP), Place 1 drop into both eyes daily as needed (dry eyes)., Disp: , Rfl:  .  aspirin EC 81 MG tablet, Take 81 mg by mouth daily., Disp: , Rfl:  .  CALCIUM PO, Take 1 tablet by mouth daily.,  Disp: , Rfl:  .  cholecalciferol (VITAMIN D) 400 units TABS tablet, Take 400 Units by mouth daily., Disp: , Rfl:  .  diltiazem (CARDIZEM CD) 120 MG 24 hr capsule, Take 1 capsule (120 mg total) by mouth daily., Disp: 90 capsule, Rfl: 3 .  flecainide (TAMBOCOR) 100 MG tablet, TAKE 1 TABLET BY MOUTH TWO  TIMES DAILY, Disp: 180 tablet, Rfl: 0 .  Ibuprofen (ADVIL PO), Take 200 mg by mouth every 6 (six) hours as needed (pain). , Disp: , Rfl:  .  losartan (COZAAR) 25 MG tablet, Take 1 tablet by mouth daily., Disp: , Rfl: 3 .  Melatonin 10 MG CAPS, Take 1 capsule by mouth daily., Disp: , Rfl:  .  norethindrone-ethinyl estradiol (JINTELI) 1-5 MG-MCG TABS tablet, Take 1 tablet by mouth daily., Disp: 28 tablet, Rfl: 5 .   amoxicillin-clavulanate (AUGMENTIN) 875-125 MG tablet, Take 1 tablet by mouth 2 (two) times daily., Disp: 14 tablet, Rfl: 0 .  fluticasone (FLOVENT HFA) 44 MCG/ACT inhaler, Inhale 1 puff into the lungs 2 (two) times daily. For 1-2 weeks., Disp: 1 Inhaler, Rfl: 0  EXAM:  Vitals:   10/04/16 1445  BP: 118/80  Pulse: 66  Temp: 98.2 F (36.8 C)    Body mass index is 31.64 kg/m.  GENERAL: vitals reviewed and listed above, alert, oriented, appears well hydrated and in no acute distress  HEENT: atraumatic, conjunttiva clear, no obvious abnormalities on inspection of external nose and ears, normal appearance of ear canals and TMs, clear nasal congestion, mild post oropharyngeal erythema with PND, no tonsillar edema or exudate, no sinus TTP  NECK: no obvious masses on inspection  LUNGS: clear to auscultation bilaterally, no wheezes, rales or rhonchi, good air movement  CV: HRRR, no peripheral edema  MS: moves all extremities without noticeable abnormality  SKIN: several small healed wounds on the L hand, no signs of infection - no warmth, redness or edema.  PSYCH: pleasant and cooperative, no obvious depression or anxiety  ASSESSMENT AND PLAN:  Discussed the following assessment and plan:  Cough Bronchitis -opted to treat with Augmentin and flovent given ongoing symptoms that may be resp infection vs RAD related to exposures  Animal bite -looks to be healing well -UTD on tetanus booster and reports animal is contained, domestic, utd on shots  Hot flashes -she wishes to continue HRT, feels benefit >>>>>> risks  -Patient advised to return or notify a doctor immediately if symptoms worsen or persist or new concerns arise.  Patient Instructions  BEFORE YOU LEAVE: -follow up: Physical exam in December  Start the Augmentin for the respiratory illness. You also can use the Flovent 1 puff twice daily. Follow-up if any worsening or persistent symptoms despite treatment.  I hope  you are feeling better soon!    Colin Benton R., DO

## 2016-10-04 NOTE — Patient Instructions (Signed)
BEFORE YOU LEAVE: -follow up: Physical exam in December  Start the Augmentin for the respiratory illness. You also can use the Flovent 1 puff twice daily. Follow-up if any worsening or persistent symptoms despite treatment.  I hope you are feeling better soon!

## 2016-10-17 ENCOUNTER — Ambulatory Visit
Admission: RE | Admit: 2016-10-17 | Discharge: 2016-10-17 | Disposition: A | Discharge status: Home / Self Care | Payer: 59 | Source: Ambulatory Visit | Attending: Family Medicine | Admitting: Family Medicine

## 2016-10-17 DIAGNOSIS — Z1231 Encounter for screening mammogram for malignant neoplasm of breast: Secondary | ICD-10-CM

## 2016-10-22 ENCOUNTER — Other Ambulatory Visit: Payer: Self-pay | Admitting: Cardiology

## 2016-10-31 ENCOUNTER — Other Ambulatory Visit: Payer: Self-pay | Admitting: Family Medicine

## 2016-11-24 ENCOUNTER — Other Ambulatory Visit: Payer: Self-pay | Admitting: Family Medicine

## 2016-11-26 NOTE — Telephone Encounter (Signed)
Veronica Valencia,  Please call pt. Let her know this comes in a lower dose - 0.5/2.5. Would she want to try lower dose to see if just as effective? If so ok to refill lower dose. Or if she wants to continue this dose ok to send refill. Thanks.

## 2016-11-26 NOTE — Telephone Encounter (Signed)
I left a message for the pt to return my call. 

## 2016-11-27 NOTE — Telephone Encounter (Signed)
I called the pt and informed her of the message below and she stated she would like to continue the dose she has been taking and not switch to a lower dose.  Refill was sent to the pharmacy.

## 2016-12-02 ENCOUNTER — Other Ambulatory Visit: Payer: Self-pay | Admitting: Cardiology

## 2016-12-05 ENCOUNTER — Telehealth: Payer: Self-pay | Admitting: *Deleted

## 2016-12-05 NOTE — Telephone Encounter (Signed)
Patient called returning Veronica Valencia's call. Please advise [phone]  Patient states her and Veronica Valencia has been discussing levels with a medication and patient states her hair is oily and she has some body odor she feels like a teenager. Patient is requesting a change or either something she may need to do.

## 2016-12-06 NOTE — Telephone Encounter (Signed)
Not sure how I can help over the phone. Advise OV to eval and discuss/examine. Thanks.

## 2016-12-06 NOTE — Telephone Encounter (Signed)
I left a detailed message with the information below and asked that the pt call back to schedule an appt.

## 2016-12-07 ENCOUNTER — Ambulatory Visit (INDEPENDENT_AMBULATORY_CARE_PROVIDER_SITE_OTHER): Payer: 59 | Admitting: Family Medicine

## 2016-12-07 ENCOUNTER — Encounter: Payer: Self-pay | Admitting: Family Medicine

## 2016-12-07 VITALS — BP 104/70 | HR 55 | Ht 63.0 in

## 2016-12-07 DIAGNOSIS — Z79890 Hormone replacement therapy (postmenopausal): Secondary | ICD-10-CM | POA: Diagnosis not present

## 2016-12-07 DIAGNOSIS — L988 Other specified disorders of the skin and subcutaneous tissue: Secondary | ICD-10-CM | POA: Diagnosis not present

## 2016-12-07 DIAGNOSIS — R234 Changes in skin texture: Secondary | ICD-10-CM | POA: Diagnosis not present

## 2016-12-07 NOTE — Addendum Note (Signed)
Addended by: Tomi Likens on: 12/07/2016 04:45 PM   Modules accepted: Orders

## 2016-12-07 NOTE — Progress Notes (Signed)
HPI:  Acute visit for concerns regarding her hormone replacement therapy. She has been on this for many years and insistent on continuing as has severe hot flashes if she stops it. She Jinteli. She has been through a lot of stress recently with various family and work issues. She is doing better now. However she has no set her skin is more oily and her scalp is more oily. She wonders if her medication is causing this. No significant weight changes that are unexplained, chest pain, palpitations, dry skin or bowel changes.  ROS: See pertinent positives and negatives per HPI.  Past Medical History:  Diagnosis Date  . Abnormal exercise tolerance test 11/24/2012   Did not reach 85% maximum heart rate. No ischemic changes noted at 79%. Notably beta blocker was not held; no arrhythmias noted.  Marland Kitchen Episcleritis of right eye   . H/O echocardiogram OS 20/40   EF 60-65% with mild concentric LVH. Normal wall motion. Grade 1 diastolic dysfunction. Trace aortic regurgitation. No clear-cut evidence of murmur source  . Hot flashes   . PAF (paroxysmal atrial fibrillation) [facility]) July 2014  . Paroxysmal atrial flutter [facility]) August 2014  . Paroxysmal SVT (supraventricular tachycardia) ([place]) 10/06/2012   This may have simply been misdiagnosed A. fib    Past Surgical History:  Procedure Laterality Date  . CESAREAN SECTION    . VARICOSE VEIN SURGERY  1990    Family History  Problem Relation Age of Onset  . Diabetes Mother   . Hypertension Mother   . Stroke Father   . Hypertension Father   . Heart attack Father 72  . Cancer Sister 60       breast    Social History   Social History  . Marital status: Married    Spouse name: N/A  . Number of children: N/A  . Years of education: N/A   Social History Main Topics  . Smoking status: Never Smoker  . Smokeless tobacco: Never Used  . Alcohol use Yes     Comment: couple of glasses of wine daily   . Drug use: No  . Sexual activity: Not Asked   Other  Topics Concern  . 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     Current Outpatient Prescriptions:  .  Artificial Tear Solution (SOOTHE HYDRATION OP), Place 1 drop into both eyes daily as needed (dry eyes)., Disp: , Rfl:  .  aspirin EC 81 MG tablet, Take 81 mg by mouth daily., Disp: , Rfl:  .  CALCIUM PO, Take 1 tablet by mouth daily., Disp: , Rfl:  .  cholecalciferol (VITAMIN D) 400 units TABS tablet, Take 400 Units by mouth daily., Disp: , Rfl:  .  diltiazem (CARDIZEM CD) 120 MG 24 hr capsule, Take 1 capsule (120 mg total) by mouth daily., Disp: 90 capsule, Rfl: 3 .  flecainide (TAMBOCOR) 100 MG tablet, TAKE 1 TABLET BY MOUTH TWO  TIMES DAILY, Disp: 180 tablet, Rfl: 3 .  FYAVOLV 1-5 MG-MCG TABS tablet, TAKE 1 TABLET BY MOUTH DAILY., Disp: 28 tablet, Rfl: 5 .  Ibuprofen (ADVIL PO), Take 200 mg by mouth every 6 (six) hours as needed (pain). , Disp: , Rfl:  .  losartan (COZAAR) 25 MG tablet, TAKE 1 TABLET (25 MG TOTAL) BY MOUTH DAILY., Disp: 90 tablet, Rfl: 2 .  Melatonin 10 MG CAPS, Take 1 capsule by mouth daily., Disp: , Rfl:   EXAM:  Vitals:   12/07/16 1619  BP: 104/70  Pulse: (!) 55    There is no height or weight on file to calculate BMI.  GENERAL: vitals reviewed and listed above, alert, oriented, appears well hydrated and in no acute distress  HEENT: atraumatic, conjunttiva clear, no obvious abnormalities on inspection of external nose and ears,  SKIN: her hair and skin actually looks great without acne or obvious excessive oil secretion  NECK: no obvious masses on inspection  LUNGS: clear to auscultation bilaterally, no wheezes, rales or rhonchi, good air movement  CV: HRRR, no peripheral edema  MS: moves all extremities without  noticeable abnormality  PSYCH: pleasant and cooperative, no obvious depression or anxiety  ASSESSMENT AND PLAN:  Discussed the following assessment and plan:  Senile dermatosis  Oily skin - Plan: TSH  Hormone replacement therapy (HRT)  -Check a thyroid level -Talked about various dry shampoo options -She sees a dermatologist and is getting Botox, she can discuss these issues with them as well -she doesn't stop her hormone therapy or change it, and it seems unlikely this will be the cause of her symptoms -Stress may have caused this, and this seems to resolved -Patient advised to return or notify a doctor immediately if symptoms worsen or persist or new concerns arise.  Patient Instructions  BEFORE YOU LEAVE: -follow up: for physical as scheduled -lab        Colin Benton R., DO

## 2016-12-07 NOTE — Patient Instructions (Addendum)
BEFORE YOU LEAVE: -follow up: for physical as scheduled -lab

## 2016-12-08 LAB — TSH: TSH: 1.86 m[IU]/L (ref 0.40–4.50)

## 2016-12-13 ENCOUNTER — Encounter: Payer: Self-pay | Admitting: Family Medicine

## 2016-12-13 ENCOUNTER — Ambulatory Visit (INDEPENDENT_AMBULATORY_CARE_PROVIDER_SITE_OTHER): Payer: 59 | Admitting: Family Medicine

## 2016-12-13 VITALS — BP 120/80 | HR 65 | Temp 98.3°F | Ht 63.0 in

## 2016-12-13 DIAGNOSIS — M542 Cervicalgia: Secondary | ICD-10-CM | POA: Diagnosis not present

## 2016-12-13 DIAGNOSIS — M25519 Pain in unspecified shoulder: Secondary | ICD-10-CM | POA: Diagnosis not present

## 2016-12-13 MED ORDER — CYCLOBENZAPRINE HCL 5 MG PO TABS: 5.0000 mg | ORAL_TABLET | Freq: Three times a day (TID) | ORAL | 1 refills | Status: DC | PRN

## 2016-12-13 NOTE — Patient Instructions (Signed)
Try the muscle relaxer at night.  Tiger balm (menthol) and rice sock as needed for pain.  Tylenol or aleve, do not take more often then instructed.  Seek immediate care if worsening, new concerns or not continuing to improve.

## 2016-12-13 NOTE — Progress Notes (Signed)
HPI:  Ariely Riddell is a pleasant 61 yo here for an acute visit for neck and shoulder pain. Reports woke up with crick in neck 3 days ago. Has had a little bit of nasal congestion, but otherwise no other symptoms. Pain is in bilat trap muscle mid fibers region mainly. No fevers, malaise, weakness, numbness, atypical headaches for her, rashes, tick bites, pain elsewhere. This is improving some today and can move her head and neck better today. Reports would not have come in except she will be traveling this weekend.  ROS: See pertinent positives and negatives per HPI.  Past Medical History:  Diagnosis Date  . Abnormal exercise tolerance test 11/24/2012   Did not reach 85% maximum heart rate. No ischemic changes noted at 79%. Notably beta blocker was not held; no arrhythmias noted.  Marland Kitchen Episcleritis of right eye   . H/O echocardiogram OS 20/40   EF 60-65% with mild concentric LVH. Normal wall motion. Grade 1 diastolic dysfunction. Trace aortic regurgitation. No clear-cut evidence of murmur source  . Hot flashes   . PAF (paroxysmal atrial fibrillation) [facility]) July 2014  . Paroxysmal atrial flutter [facility]) August 2014  . Paroxysmal SVT (supraventricular tachycardia) ([place]) 10/06/2012   This may have simply been misdiagnosed A. fib    Past Surgical History:  Procedure Laterality Date  . CESAREAN SECTION    . VARICOSE VEIN SURGERY  1990    Family History  Problem Relation Age of Onset  . Diabetes Mother   . Hypertension Mother   . Stroke Father   . Hypertension Father   . Heart attack Father 39  . Cancer Sister 45       breast    Social History   Social History  . Marital status: Married    Spouse name: N/A  . Number of children: N/A  . Years of education: N/A   Social History Main Topics  . Smoking status: Never Smoker  . Smokeless tobacco: Never Used  . Alcohol use Yes     Comment: couple of glasses of wine daily   . Drug use: No  . Sexual activity: Not Asked   Other Topics  Concern  . 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     Current Outpatient Prescriptions:  .  Artificial Tear Solution (SOOTHE HYDRATION OP), Place 1 drop into both eyes daily as needed (dry eyes)., Disp: , Rfl:  .  aspirin EC 81 MG tablet, Take 81 mg by mouth daily., Disp: , Rfl:  .  CALCIUM PO, Take 1 tablet by mouth daily., Disp: , Rfl:  .  cholecalciferol (VITAMIN D) 400 units TABS tablet, Take 400 Units by mouth daily., Disp: , Rfl:  .  diltiazem (CARDIZEM CD) 120 MG 24 hr capsule, Take 1 capsule (120 mg total) by mouth daily., Disp: 90 capsule, Rfl: 3 .  flecainide (TAMBOCOR) 100 MG tablet, TAKE 1 TABLET BY MOUTH TWO  TIMES DAILY, Disp: 180 tablet, Rfl: 3 .  FYAVOLV 1-5 MG-MCG TABS tablet, TAKE 1 TABLET BY MOUTH DAILY., Disp: 28 tablet, Rfl: 5 .  Ibuprofen (ADVIL PO), Take 200 mg by mouth every 6 (six) hours as needed (pain). , Disp: , Rfl:  .  losartan (COZAAR) 25 MG tablet, TAKE 1 TABLET (25 MG TOTAL) BY MOUTH DAILY., Disp: 90 tablet, Rfl: 2 .  Melatonin 10 MG CAPS, Take 1 capsule by mouth daily., Disp: , Rfl:  .  cyclobenzaprine (FLEXERIL) 5 MG tablet, Take 1 tablet (5 mg total) by mouth 3 (three) times daily as needed for muscle spasms., Disp: 30 tablet, Rfl: 1  EXAM:  Vitals:   12/13/16 1546  BP: 120/80  Pulse: 65  Temp: 98.3 F (36.8 C)    There is no height or weight on file to calculate BMI.  GENERAL: vitals reviewed and listed above, alert, oriented, appears well hydrated and in no acute distress  HEENT: atraumatic, conjunttiva clear, PERRLA, no obvious abnormalities on inspection of external nose and ears, normal appearance of ear canals and TMs, clear nasal congestion, mild post oropharyngeal erythema with PND, no tonsillar edema  or exudate, no sinus TTP  NECK: no obvious masses on inspection, no meningeal signs  LUNGS: clear to auscultation bilaterally, no wheezes, rales or rhonchi, good air movement  CV: HRRR, no peripheral edema  MS: moves all extremities without noticeable abnormality, some tenderness mid giber traps bilar in upper back, no bony TTP, 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  NEURO: CN II-XII grossly intact, finger to nose normal  PSYCH: pleasant and cooperative, no obvious depression or anxiety  ASSESSMENT AND PLAN:  Discussed the following assessment and plan:  Neck and shoulder pain  -we discussed possible serious and likely etiologies, workup and treatment, treatment risks and return precautions -after this discussion, Trinitie opted for muscle relaxer, conservative care for likely muscle soreness and spasm from sleeping position -follow up advised if worsening, new symptoms or does not continue to improve -of course, we advised Caia  to return or notify a doctor immediately if symptoms worsen or persist or new concerns arise.   Patient Instructions  Try the muscle relaxer at night.  Tiger balm (menthol) and rice sock as needed for pain.  Tylenol or aleve, do not take more often then instructed.  Seek immediate care if worsening, new concerns or not continuing to improve.     Colin Benton R., DO

## 2017-01-16 ENCOUNTER — Other Ambulatory Visit: Payer: Self-pay | Admitting: Cardiology

## 2017-02-07 NOTE — Progress Notes (Deleted)
HPI:  Here for CPE:  -Concerns and/or follow up today:  -Diet: variety of foods, balance and well rounded, larger portion sizes -Exercise: no regular exercise -Taking folic acid, vitamin D or calcium: no -Diabetes and Dyslipidemia Screening: *** -Vaccines: see vaccine section EPIC -pap history: *** -FDLMP: see nursing notes -sexual activity: yes, female partner, no new partners -wants STI testing (Hep C if born 44-65): no -FH breast, colon or ovarian ca: see FH Last mammogram: *** Last colon cancer screening: *** Breast Ca Risk Assessment: see family history and pt history DEXA (>/= 44): ***  -Alcohol, Tobacco, drug use: see social history  Review of Systems - no fevers, unintentional weight loss, vision loss, hearing loss, chest pain, sob, hemoptysis, melena, hematochezia, hematuria, genital discharge, changing or concerning skin lesions, bleeding, bruising, loc, thoughts of self harm or SI  Past Medical History:  Diagnosis Date  . Abnormal exercise tolerance test 11/24/2012   Did not reach 85% maximum heart rate. No ischemic changes noted at 79%. Notably beta blocker was not held; no arrhythmias noted.  Marland Kitchen Episcleritis of right eye   . H/O echocardiogram OS 20/40   EF 60-65% with mild concentric LVH. Normal wall motion. Grade 1 diastolic dysfunction. Trace aortic regurgitation. No clear-cut evidence of murmur source  . Hot flashes   . PAF (paroxysmal atrial fibrillation) [facility]) July 2014  . Paroxysmal atrial flutter [facility]) August 2014  . Paroxysmal SVT (supraventricular tachycardia) ([place]) 10/06/2012   This may have simply been misdiagnosed A. fib    Past Surgical History:  Procedure Laterality Date  . CESAREAN SECTION    . VARICOSE VEIN SURGERY  1990    Family History  Problem Relation Age of Onset  . Diabetes Mother   . Hypertension Mother   . Stroke Father   . Hypertension Father   . Heart attack Father 46  . Cancer Sister 48       breast    Social History    Socioeconomic History  . Marital status: Married    Spouse name: Not on file  . Number of children: Not on file  . Years of education: Not on file  . Highest education level: Not on file  Social Needs  . Financial resource strain: Not on file  . Food insecurity - worry: Not on file  . Food insecurity - inability: Not on file  . Transportation needs - medical: Not on file  . Transportation needs - non-medical: Not on file  Occupational History  . Not on file  Tobacco Use  . Smoking status: Never Smoker  . Smokeless tobacco: Never Used  Substance and Sexual Activity  . Alcohol use: Yes    Comment: couple of glasses of wine daily   . Drug use: No  . Sexual activity: Not on file  Other Topics Concern  . 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     Current Outpatient Medications:  .  Artificial Tear Solution (SOOTHE HYDRATION OP), Place 1 drop into both eyes daily as needed (dry eyes)., Disp: , Rfl:  .  aspirin EC 81 MG tablet, Take 81 mg by mouth daily., Disp: , Rfl:  .  CALCIUM PO, Take 1 tablet  by mouth daily., Disp: , Rfl:  .  cholecalciferol (VITAMIN D) 400 units TABS tablet, Take 400 Units by mouth daily., Disp: , Rfl:  .  cyclobenzaprine (FLEXERIL) 5 MG tablet, Take 1 tablet (5 mg total) by mouth 3 (three) times daily as needed for muscle spasms., Disp: 30 tablet, Rfl: 1 .  diltiazem (CARDIZEM CD) 120 MG 24 hr capsule, TAKE 1 CAPSULE BY MOUTH  DAILY, Disp: 90 capsule, Rfl: 2 .  flecainide (TAMBOCOR) 100 MG tablet, TAKE 1 TABLET BY MOUTH TWO  TIMES DAILY, Disp: 180 tablet, Rfl: 3 .  FYAVOLV 1-5 MG-MCG TABS tablet, TAKE 1 TABLET BY MOUTH DAILY., Disp: 28 tablet, Rfl: 5 .  Ibuprofen (ADVIL PO), Take 200 mg by mouth every 6 (six) hours  as needed (pain). , Disp: , Rfl:  .  losartan (COZAAR) 25 MG tablet, TAKE 1 TABLET (25 MG TOTAL) BY MOUTH DAILY., Disp: 90 tablet, Rfl: 2 .  Melatonin 10 MG CAPS, Take 1 capsule by mouth daily., Disp: , Rfl:   EXAM:  There were no vitals filed for this visit.  GENERAL: vitals reviewed and listed below, alert, oriented, appears well hydrated and in no acute distress  HEENT: head atraumatic, PERRLA, normal appearance of eyes, ears, nose and mouth. moist mucus membranes.  NECK: supple, no masses or lymphadenopathy  LUNGS: clear to auscultation bilaterally, no rales, rhonchi or wheeze  CV: HRRR, no peripheral edema or cyanosis, normal pedal pulses  ABDOMEN: bowel sounds normal, soft, non tender to palpation, no masses, no rebound or guarding  GU/BREAST: ***  SKIN: no rash or abnormal lesions  MS: normal gait, moves all extremities normally  NEURO: normal gait, speech and thought processing grossly intact, muscle tone grossly intact throughout  PSYCH: normal affect, pleasant and cooperative  ASSESSMENT AND PLAN:  Discussed the following assessment and plan:  PREVENTIVE EXAM: -Discussed and advised all [place] preventive services health task force level A and B recommendations for age, sex and risks. -Advised at least 150 minutes of exercise per week and a healthy diet with avoidance of (less then 1 serving per week) processed foods, white starches, red meat, fast foods and sweets and consisting of: * 5-9 servings of fresh fruits and vegetables (not corn or potatoes) *nuts and seeds, beans *olives and olive oil *lean meats such as fish and white chicken  *whole grains -labs, studies and vaccines per orders this encounter  There are no diagnoses linked to this encounter. ***  Patient advised to return to clinic immediately if symptoms worsen or persist or new concerns.  There are no Patient Instructions on file for this visit.  No Follow-up on file.  Colin Benton R., DO

## 2017-02-11 ENCOUNTER — Encounter: Payer: 59 | Admitting: Family Medicine

## 2017-03-18 ENCOUNTER — Other Ambulatory Visit: Payer: Self-pay | Admitting: *Deleted

## 2017-03-18 MED ORDER — NORETHINDRONE-ETH ESTRADIOL 1-5 MG-MCG PO TABS: 1.0000 | ORAL_TABLET | Freq: Every day | ORAL | 5 refills | Status: DC

## 2017-03-18 NOTE — Telephone Encounter (Signed)
Rx done. 

## 2017-07-05 ENCOUNTER — Other Ambulatory Visit: Payer: Self-pay | Admitting: Cardiology

## 2017-07-05 MED ORDER — DILTIAZEM HCL ER COATED BEADS 120 MG PO CP24: 120.0000 mg | ORAL_CAPSULE | Freq: Every day | ORAL | 2 refills | Status: DC

## 2017-07-05 NOTE — Addendum Note (Signed)
Addended by: Leanord Asal T on: 07/05/2017 03:07 PM   Modules accepted: Orders

## 2017-07-05 NOTE — Telephone Encounter (Signed)
°*  STAT* If patient is at the pharmacy, call can be transferred to refill team.   1. Which medications need to be refilled? (please list name of each medication and dose if known) Diltiazem-need this today if possible-completely out of it  2. Which pharmacy/location (including street and city if local pharmacy) is medication to be sent to?[phone] 3. Do they need a 30 day or 90 day supply? 90 and refills

## 2017-08-21 ENCOUNTER — Other Ambulatory Visit: Payer: Self-pay | Admitting: Cardiology

## 2017-08-22 ENCOUNTER — Telehealth: Payer: Self-pay | Admitting: Cardiology

## 2017-08-22 NOTE — Telephone Encounter (Signed)
Called patient and LVM to call back to schedule follow up.

## 2017-08-30 ENCOUNTER — Other Ambulatory Visit: Payer: Self-pay | Admitting: Family Medicine

## 2017-09-19 ENCOUNTER — Other Ambulatory Visit: Payer: Self-pay | Admitting: Cardiology

## 2017-09-20 NOTE — Telephone Encounter (Signed)
Rx sent to pharmacy   

## 2017-09-23 ENCOUNTER — Other Ambulatory Visit: Payer: Self-pay | Admitting: Family Medicine

## 2017-09-23 DIAGNOSIS — Z1231 Encounter for screening mammogram for malignant neoplasm of breast: Secondary | ICD-10-CM

## 2017-09-24 ENCOUNTER — Ambulatory Visit: Payer: Managed Care, Other (non HMO) | Admitting: Family Medicine

## 2017-09-24 ENCOUNTER — Telehealth: Payer: Self-pay | Admitting: *Deleted

## 2017-09-24 ENCOUNTER — Encounter: Payer: Self-pay | Admitting: Family Medicine

## 2017-09-24 VITALS — BP 120/80 | HR 64 | Temp 98.0°F | Ht 63.0 in | Wt 174.9 lb

## 2017-09-24 DIAGNOSIS — L231 Allergic contact dermatitis due to adhesives: Secondary | ICD-10-CM

## 2017-09-24 DIAGNOSIS — R197 Diarrhea, unspecified: Secondary | ICD-10-CM | POA: Diagnosis not present

## 2017-09-24 DIAGNOSIS — I10 Essential (primary) hypertension: Secondary | ICD-10-CM

## 2017-09-24 NOTE — Progress Notes (Signed)
HPI:  Using dictation device. Unfortunately this device frequently misinterprets words/phrases.  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  Past Medical History:  Diagnosis Date  . Abnormal exercise tolerance test 11/24/2012   Did not reach 85% maximum heart rate. No ischemic changes noted at 79%. Notably beta blocker was not held; no arrhythmias noted.  Marland Kitchen Episcleritis of right eye   . H/O echocardiogram OS 20/40   EF 60-65% with mild concentric LVH. Normal wall motion. Grade 1 diastolic dysfunction. Trace aortic regurgitation. No clear-cut evidence of murmur source  . Hot flashes   . PAF (paroxysmal atrial fibrillation) [facility]) July 2014  . Paroxysmal atrial flutter [facility]) August 2014  . Paroxysmal SVT (supraventricular tachycardia) ([place]) 10/06/2012   This may have simply been misdiagnosed A. fib    Past Surgical History:  Procedure Laterality Date  . CESAREAN SECTION    . VARICOSE VEIN SURGERY  1990    Family History  Problem Relation Age of Onset  . Diabetes Mother   . Hypertension Mother   . Stroke Father   . Hypertension Father   . Heart attack Father 28  . Cancer Sister 33       breast    SOCIAL HX: see  hpi   Current Outpatient Medications:  .  Artificial Tear Solution (SOOTHE HYDRATION OP), Place 1 drop into both eyes daily as needed (dry eyes)., Disp: , Rfl:  .  aspirin EC 81 MG tablet, Take 81 mg by mouth daily., Disp: , Rfl:  .  CALCIUM PO, Take 1 tablet by mouth daily., Disp: , Rfl:  .  cholecalciferol (VITAMIN D) 400 units TABS tablet, Take 400 Units by mouth daily., Disp: , Rfl:  .  diltiazem (CARDIZEM CD) 120 MG 24 hr capsule, Take 1 capsule (120 mg total) by mouth daily., Disp: 90 capsule, Rfl: 2 .  flecainide (TAMBOCOR) 100 MG tablet, TAKE 1 TABLET BY MOUTH TWO  TIMES DAILY, Disp: 180 tablet, Rfl: 3 .  FYAVOLV 1-5 MG-MCG TABS tablet, TAKE 1 TABLET BY MOUTH EVERY DAY, Disp: 84 tablet, Rfl: 0 .  Ibuprofen (ADVIL PO), Take 200 mg by mouth every 6 (six) hours as needed (pain). , Disp: , Rfl:  .  losartan (COZAAR) 25 MG tablet, Take 1 tablet (25 mg total) by mouth daily. Keep OV for further refills., Disp: 90 tablet, Rfl: 0 .  Melatonin 10 MG CAPS, Take 1 capsule by mouth daily., Disp: , Rfl:   EXAM:  Vitals:   09/24/17 1624  BP: 120/80  Pulse: 64  Temp: 98 F (36.7 C)    Body mass index is 30.98 kg/m.  GENERAL: vitals reviewed and listed above, alert, oriented, appears well hydrated and in no acute distress  HEENT: atraumatic, conjunttiva clear, no  obvious abnormalities on inspection of external nose and ears  NECK: no obvious masses on inspection  LUNGS: clear to auscultation bilaterally, no wheezes, rales or rhonchi, good air movement  CV: HRRR, no peripheral edema  ABD: soft, NTTP  SKIN: small area erythematous papules in perfect rectangular patch on chest  MS: moves all extremities without noticeable abnormality  PSYCH: pleasant and cooperative, no obvious depression or anxiety  ASSESSMENT AND PLAN:  Discussed the following assessment and plan:  Diarrhea, unspecified type - Plan: Stool culture, C. difficile, PCR([facility]) -we discussed possible  serious and likely etiologies, workup and treatment, treatment risks and return precautions - query from dietary change or provbiotic vs infectious vs other -after this discussion, Jaqlyn opted for trial off probiotic, imodium, no dairy, get colonosocpy if not already scheduled (assistant advised to help her with this and check to see if scheduled), stool culture/studies if persists in 1 week -of course, we advised Timaya  to return or notify a doctor immediately if symptoms worsen or persist or new concerns arise.  Allergic contact dermatitis due to adhesives -topical low pt steroid -follow up as needed  Essential hypertension -discussed risks/benefit med and BP goals -advised to continue medication and healthy lifestyle for now as DBP not quite at goal  -Patient advised to return or notify a doctor immediately if symptoms worsen or persist or new concerns arise.  Patient Instructions  BEFORE YOU LEAVE: -stool cup  NO dairy for 2-3 weeks  Imodium as needed  Complete the stool studies if symptoms persist over the next week  I hope you are feeling better soon! Seek care promptly if your symptoms worsen, new concerns arise or you are not improving with treatment.       Lucretia Kern, DO

## 2017-09-24 NOTE — Telephone Encounter (Signed)
I left a detailed message at the [facility] cell number stating Dr Maudie Mercury reviewed her chart and noted she is overdue for a colonoscopy.  I advised the pt to call to schedule this appt and to call back with any questions.

## 2017-09-24 NOTE — Patient Instructions (Signed)
BEFORE YOU LEAVE: -stool cup  NO dairy for 2-3 weeks  Imodium as needed  Complete the stool studies if symptoms persist over the next week  I hope you are feeling better soon! Seek care promptly if your symptoms worsen, new concerns arise or you are not improving with treatment.

## 2017-10-09 ENCOUNTER — Telehealth: Payer: Self-pay | Admitting: Family Medicine

## 2017-10-09 NOTE — Addendum Note (Signed)
Addended by: Rene Kocher on: 10/09/2017 10:16 AM   Modules accepted: Orders

## 2017-10-09 NOTE — Telephone Encounter (Signed)
Copied from [place] [id number]. Topic: General - Other >> Oct 09, 2017 10:40 AM Burchel, Abbi R wrote: Reason for CRM:   Pt wanted to notify someone that she left the stool sample Dr Maudie Mercury requested with her name and Dr Julianne Rice name on it at the office this morning.

## 2017-10-09 NOTE — Addendum Note (Signed)
Addended by: Rene Kocher on: 10/09/2017 10:13 AM   Modules accepted: Orders

## 2017-10-12 LAB — CLOSTRIDIUM DIFFICILE BY PCR: Toxigenic C. Difficile by PCR: NEGATIVE

## 2017-10-14 LAB — STOOL CULTURE: E coli, Shiga toxin Assay: NEGATIVE

## 2017-10-14 NOTE — Telephone Encounter (Signed)
See results note. 

## 2017-10-18 ENCOUNTER — Telehealth: Payer: Self-pay | Admitting: Family Medicine

## 2017-10-18 DIAGNOSIS — R197 Diarrhea, unspecified: Secondary | ICD-10-CM

## 2017-10-18 NOTE — Telephone Encounter (Signed)
Copied from [place] [id number]. Topic: Referral - Request >> Oct 18, 2017  4:08 PM Yvette Rack wrote: Reason for CRM: Pt states she has been having diarrhea since 08/31/17 and she was told if it continues she should see a Copywriter, advertising. Pt requests a referral to Gastroenterologist [phone]

## 2017-10-20 NOTE — Telephone Encounter (Signed)
Please send referral. Also see if she needs OV in interim if bad symptoms? Thanks!

## 2017-10-21 ENCOUNTER — Ambulatory Visit
Admission: RE | Admit: 2017-10-21 | Discharge: 2017-10-21 | Disposition: A | Discharge status: Home / Self Care | Payer: Managed Care, Other (non HMO) | Source: Ambulatory Visit | Attending: Family Medicine | Admitting: Family Medicine

## 2017-10-21 ENCOUNTER — Encounter: Payer: Self-pay | Admitting: Nurse Practitioner

## 2017-10-21 DIAGNOSIS — Z1231 Encounter for screening mammogram for malignant neoplasm of breast: Secondary | ICD-10-CM

## 2017-10-21 NOTE — Telephone Encounter (Signed)
Referral placed and I left a detailed message with the information below at the pts cell number.   

## 2017-11-07 ENCOUNTER — Encounter: Payer: Self-pay | Admitting: Nurse Practitioner

## 2017-11-07 ENCOUNTER — Ambulatory Visit: Payer: Managed Care, Other (non HMO) | Admitting: Nurse Practitioner

## 2017-11-07 ENCOUNTER — Encounter (INDEPENDENT_AMBULATORY_CARE_PROVIDER_SITE_OTHER): Payer: Self-pay

## 2017-11-07 ENCOUNTER — Encounter

## 2017-11-07 VITALS — BP 98/60 | HR 64 | Ht 63.0 in | Wt 172.6 lb

## 2017-11-07 DIAGNOSIS — R197 Diarrhea, unspecified: Secondary | ICD-10-CM | POA: Diagnosis not present

## 2017-11-07 DIAGNOSIS — Z1211 Encounter for screening for malignant neoplasm of colon: Secondary | ICD-10-CM

## 2017-11-07 DIAGNOSIS — Z1212 Encounter for screening for malignant neoplasm of rectum: Secondary | ICD-10-CM | POA: Diagnosis not present

## 2017-11-07 MED ORDER — DIPHENOXYLATE-ATROPINE 2.5-0.025 MG PO TABS: 1.0000 | ORAL_TABLET | Freq: Two times a day (BID) | ORAL | 1 refills | Status: DC | PRN

## 2017-11-07 MED ORDER — NA SULFATE-K SULFATE-MG SULF 17.5-3.13-1.6 GM/177ML PO SOLN: ORAL | 0 refills | Status: DC

## 2017-11-07 NOTE — Progress Notes (Signed)
Reviewed and agree with management plan.  Kimmi Acocella T. Adylene Dlugosz, MD FACG 

## 2017-11-07 NOTE — Progress Notes (Signed)
GI Provider:  New to practice              Chief Complaint: diarrhea   Referring Provider:    Colin Benton, MD    ASSESSMENT AND PLAN;   64.  62 year old female with 68-month history of diarrhea .  C. difficile negative .  Diarrhea improving since discontinuation of Nutrisystem two weeks ago.  Hopefully diarrhea was diet related.  She is nontoxic-appearing, abdominal exam unremarkable.   -Though diarrhea is getting better after stopping Nutrisystem patient leaves for [place] cruise late September and is concerned about having diarrhea on the trip.  At this point she is only taking 1 Imodium every third day because of her "heart condition".  Will give Lomotil to take twice daily only as needed should diarrhea occur while on the cruise -Patient is due for screening colonoscopy.  If diarrhea does not resolve at time of colonoscopy then we can obtain  random colon biopsies to rule out microscopic colitis  2.  Cancer screening.  Patient had a colonoscopy in [place] in 2009.  She is due for repeat screening.  -The risks and benefits of colonoscopy with possible polypectomy were discussed and the patient agrees to proceed.   3. SVT / Aflutter, on Tambocor  HPI:    Patient is a 62 yo female who began having diarrhea on June 29, especially after meals.  No nocturnal stooling . No associated fevers, abdominal pain or significant nausea. Prior to onset of diarrhea she typically had 1 bowel movement a day after coffee.  Initially patient was having several loose nonbloody bowel movements a day. Patient cannot remember the last time she had antibiotics but it has been many, many months ago and C-diff is negative.  No new medications other than zantac which was just recently started. She uses Stevia sweetener but has done so for years . She did start Nutrisystem in early April.  A health care provider friend  recommended stopping the Nutrisystem which she did approximately two weeks ago. She also  stopped dairy.   Started taking Imodium in July but due to "heart condition" has weaned down to 1 tablet about every third day.  Over the last 2 weeks she has had some partially formed and even occasional solid stool.  Patient leaves for [place] September 25.  She will be gone for 2 weeks and is concerned about having diarrhea on the trip.    Past Medical History:  Diagnosis Date  . Abnormal exercise tolerance test 11/24/2012   Did not reach 85% maximum heart rate. No ischemic changes noted at 79%. Notably beta blocker was not held; no arrhythmias noted.  Marland Kitchen Episcleritis of right eye   . H/O echocardiogram OS 20/40   EF 60-65% with mild concentric LVH. Normal wall motion. Grade 1 diastolic dysfunction. Trace aortic regurgitation. No clear-cut evidence of murmur source  . Hot flashes   . PAF (paroxysmal atrial fibrillation) [facility]) July 2014  . Paroxysmal atrial flutter [facility]) August 2014  . Paroxysmal SVT (supraventricular tachycardia) ([place]) 10/06/2012   This may have simply been misdiagnosed A. fib     Past Surgical History:  Procedure Laterality Date  . CESAREAN SECTION    . [place]  . WRIST SURGERY Left    Family History  Problem Relation Age of Onset  . Diabetes Mother   . Hypertension Mother   . Stroke Father   . Hypertension Father   . Heart attack Father 10  .  Cancer Sister 49       breast  . Ulcerative colitis Son   . Breast cancer Neg Hx    Social History   Tobacco Use  . Smoking status: Never Smoker  . Smokeless tobacco: Never Used  Substance Use Topics  . Alcohol use: Yes    Comment: 2-4 glasses wine daily  . Drug use: No   Current Outpatient Medications  Medication Sig Dispense Refill  . Artificial Tear Solution (SOOTHE HYDRATION OP) Place 1 drop into both eyes daily as needed (dry eyes).    Marland Kitchen aspirin EC 81 MG tablet Take 81 mg by mouth daily.    Marland Kitchen CALCIUM PO Take 1 tablet by mouth daily.    . cholecalciferol (VITAMIN D) 400  units TABS tablet Take 400 Units by mouth daily.    Marland Kitchen diltiazem (CARDIZEM CD) 120 MG 24 hr capsule Take 1 capsule (120 mg total) by mouth daily. 90 capsule 2  . flecainide (TAMBOCOR) 100 MG tablet TAKE 1 TABLET BY MOUTH TWO  TIMES DAILY 180 tablet 3  . FYAVOLV 1-5 MG-MCG TABS tablet TAKE 1 TABLET BY MOUTH EVERY DAY 84 tablet 0  . Ibuprofen (ADVIL PO) Take 200 mg by mouth every 6 (six) hours as needed (pain).     Marland Kitchen loperamide (IMODIUM) 2 MG capsule Take 2 mg by mouth as needed for diarrhea or loose stools.    Marland Kitchen losartan (COZAAR) 25 MG tablet Take 1 tablet (25 mg total) by mouth daily. Keep OV for further refills. 90 tablet 0  . Melatonin 10 MG CAPS Take 1 capsule by mouth daily.    . raNITIdine HCl (ZANTAC PO) Take 1 tablet by mouth 2 (two) times daily.     No current facility-administered medications for this visit.    No Known Allergies   Review of Systems: Positive for back pain with new exercise routine, fatigue, shortness of breath with exertion, excessive urination . All other systems reviewed and negative except where noted in HPI.   Creatinine clearance cannot be calculated (Patient's most recent lab result is older than the maximum 21 days allowed.)   Physical Exam:    Wt Readings from Last 3 Encounters:  11/07/17 172 lb 9.6 oz (78.3 kg)  09/24/17 174 lb 14.4 oz (79.3 kg)  10/04/16 178 lb 9.6 oz (81 kg)    BP 98/60   Pulse 64   Ht 5\' 3"  (1.6 m)   Wt 172 lb 9.6 oz (78.3 kg)   BMI 30.57 kg/m  Constitutional:  Pleasant female in no acute distress. Psychiatric: Normal mood and affect. Behavior is normal. EENT: Pupils normal.  Conjunctivae are normal. No scleral icterus. Neck supple.  Cardiovascular: Normal rate, regular rhythm. No edema Pulmonary/chest: Effort normal and breath sounds normal. No wheezing, rales or rhonchi. Abdominal: Soft, nondistended, nontender. Bowel sounds active throughout. There are no masses palpable. No hepatomegaly. Neurological: Alert and  oriented to person place and time. Skin: Skin is warm and dry. No rashes noted.  Tye Savoy, NP  11/07/2017, 11:23 AM  Cc:  Lucretia Kern, DO

## 2017-11-07 NOTE — Patient Instructions (Addendum)
If you are age 61 or older, your body mass index should be between 23-30. Your Body mass index is 30.57 kg/m. If this is out of the aforementioned range listed, please consider follow up with your Primary Care Provider.  If you are age 50 or younger, your body mass index should be between 19-25. Your Body mass index is 30.57 kg/m. If this is out of the aformentioned range listed, please consider follow up with your Primary Care Provider.   You have been scheduled for a colonoscopy. Please follow written instructions given to you at your visit today.  Please pick up your prep supplies at the pharmacy within the next 1-3 days. If you use inhalers (even only as needed), please bring them with you on the day of your procedure. Your physician has requested that you go to www.startemmi.com and enter the access code given to you at your visit today. This web site gives a general overview about your procedure. However, you should still follow specific instructions given to you by our office regarding your preparation for the procedure.  We have sent the following medications to your pharmacy for you to pick up at your convenience: Suprep Lomotil   Thank you for choosing me and [facility].   Tye Savoy, NP

## 2017-11-11 ENCOUNTER — Ambulatory Visit: Payer: Managed Care, Other (non HMO) | Admitting: Cardiology

## 2017-11-11 ENCOUNTER — Encounter: Payer: Self-pay | Admitting: Cardiology

## 2017-11-11 VITALS — BP 122/70 | HR 63 | Ht 63.0 in | Wt 171.6 lb

## 2017-11-11 DIAGNOSIS — I10 Essential (primary) hypertension: Secondary | ICD-10-CM

## 2017-11-11 DIAGNOSIS — I471 Supraventricular tachycardia: Secondary | ICD-10-CM

## 2017-11-11 DIAGNOSIS — I480 Paroxysmal atrial fibrillation: Secondary | ICD-10-CM

## 2017-11-11 DIAGNOSIS — I358 Other nonrheumatic aortic valve disorders: Secondary | ICD-10-CM

## 2017-11-11 DIAGNOSIS — Z79899 Other long term (current) drug therapy: Secondary | ICD-10-CM | POA: Diagnosis not present

## 2017-11-11 MED ORDER — FLECAINIDE ACETATE 100 MG PO TABS: 100.0000 mg | ORAL_TABLET | Freq: Two times a day (BID) | ORAL | 3 refills | Status: DC

## 2017-11-11 MED ORDER — DILTIAZEM HCL ER COATED BEADS 120 MG PO CP24: 120.0000 mg | ORAL_CAPSULE | Freq: Every evening | ORAL | 3 refills | Status: DC

## 2017-11-11 MED ORDER — LOSARTAN POTASSIUM 25 MG PO TABS: 25.0000 mg | ORAL_TABLET | Freq: Every day | ORAL | 3 refills | Status: DC

## 2017-11-11 NOTE — Patient Instructions (Signed)
MEDICATION CHANGES   TAKE DILTIAZEM IN THE EVENING    Your physician wants you to follow-up in [place]. You will receive a reminder letter in the mail two months in advance. If you don't receive a letter, please call our office to schedule the follow-up appointment.    If you need a refill on your cardiac medications before your next appointment, please call your pharmacy.

## 2017-11-11 NOTE — Progress Notes (Signed)
PCP: Lucretia Kern, DO  Clinic Note: Chief Complaint  Patient presents with  . Follow-up    No complaints  . Tachycardia    History of PSVT.  No recurrent symptoms    HPI: Veronica Valencia is a 62 y.o. female with a PMH below who presents today for > 1 yr f/u for PSVT (~ PAF)  Veronica Valencia was last seen on 08/22/16 = was doing well.  No recurrent symptoms.  Recent Hospitalizations: None  Studies Personally Reviewed - (if available, images/films reviewed: From Epic Chart or Care Everywhere)  None  Interval History: Veronica Valencia returns here today doing well.  She is in good spirits.  She recently retired with early retirement from her job with [employer] for [employer].  She is now very busy engaged in several projects through [employer] and is doing make multiple trips.  She is an upcoming 2-week cruise in the [place]. She is totally astigmatic from a cardiac standpoint with no recurrent tachycardia spells.  She feels great.  Full of energy.  The only thing that she notices them when she is walking up hills she does get a little bit short of breath and little bit tired.  No chest pain or shortness of breath with rest or exertion. No PND, orthopnea or edema. No palpitations, lightheadedness, dizziness, weakness or syncope/near syncope. No TIA/amaurosis fugax symptoms. No melena, hematochezia, hematuria, or epstaxis. No claudication.  ROS: A comprehensive was performed. Review of Systems  Constitutional: Negative for malaise/fatigue.  Respiratory: Negative for cough, shortness of breath and wheezing.   Musculoskeletal: Negative for falls and joint pain.  Neurological: Negative for dizziness.  All other systems reviewed and are negative.   I have reviewed and (if needed) personally updated the patient's problem list, medications, allergies, past medical and surgical history, social and family history.   Past Medical History:  Diagnosis Date  . Abnormal exercise tolerance test  11/24/2012   Did not reach 85% maximum heart rate. No ischemic changes noted at 79%. Notably beta blocker was not held; no arrhythmias noted.  Marland Kitchen Episcleritis of right eye   . H/O echocardiogram OS 20/40   EF 60-65% with mild concentric LVH. Normal wall motion. Grade 1 diastolic dysfunction. Trace aortic regurgitation. No clear-cut evidence of murmur source  . Hot flashes   . PAF (paroxysmal atrial fibrillation) [facility]) July 2014  . Paroxysmal atrial flutter [facility] August 2014  . Paroxysmal SVT (supraventricular tachycardia) ([place]) 10/06/2012   This may have simply been misdiagnosed A. fib    Past Surgical History:  Procedure Laterality Date  . CESAREAN SECTION    . [place]  . WRIST SURGERY Left     Current Meds  Medication Sig  . aspirin EC 81 MG tablet Take 81 mg by mouth daily.  Marland Kitchen CALCIUM PO Take 1 tablet by mouth daily.  . cholecalciferol (VITAMIN D) 400 units TABS tablet Take 400 Units by mouth daily.  Marland Kitchen diltiazem (CARDIZEM CD) 120 MG 24 hr capsule Take 1 capsule (120 mg total) by mouth every evening.  . diphenoxylate-atropine (LOMOTIL) 2.5-0.025 MG tablet Take 1 tablet by mouth 2 (two) times daily as needed for diarrhea or loose stools.  . flecainide (TAMBOCOR) 100 MG tablet Take 1 tablet (100 mg total) by mouth 2 (two) times daily.  . FYAVOLV 1-5 MG-MCG TABS tablet TAKE 1 TABLET BY MOUTH EVERY DAY  . Ibuprofen (ADVIL PO) Take 200 mg by mouth every 6 (six) hours as needed (pain).   Marland Kitchen  loperamide (IMODIUM) 2 MG capsule Take 2 mg by mouth as needed for diarrhea or loose stools.  Marland Kitchen losartan (COZAAR) 25 MG tablet Take 1 tablet (25 mg total) by mouth daily.  . Melatonin 10 MG CAPS Take 1 capsule by mouth daily.  . Na Sulfate-K Sulfate-Mg Sulf 17.5-3.13-1.6 GM/177ML SOLN Suprep-Use as directed  . raNITIdine HCl (ZANTAC PO) Take 1 tablet by mouth as needed.   . [DISCONTINUED] diltiazem (CARDIZEM CD) 120 MG 24 hr capsule Take 1 capsule (120 mg total) by mouth daily.  .  [DISCONTINUED] flecainide (TAMBOCOR) 100 MG tablet TAKE 1 TABLET BY MOUTH TWO  TIMES DAILY  . [DISCONTINUED] losartan (COZAAR) 25 MG tablet Take 1 tablet (25 mg total) by mouth daily. Keep OV for further refills.    No Known Allergies  Social History   Tobacco Use  . Smoking status: Never Smoker  . Smokeless tobacco: Never Used  Substance Use Topics  . Alcohol use: Yes    Comment: 2-4 glasses wine daily  . Drug use: No   Social History   Social History Narrative   She is a married mother of 3.    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    family history includes Cancer (age of onset: 23) in her sister; Diabetes in her mother; Heart attack (age of onset: 76) in her father; Hypertension in her father and mother; Stroke in her father; Ulcerative colitis in her son.  Wt Readings from Last 3 Encounters:  11/11/17 171 lb 9.6 oz (77.8 kg)  11/07/17 172 lb 9.6 oz (78.3 kg)  09/24/17 174 lb 14.4 oz (79.3 kg)    PHYSICAL EXAM BP 122/70   Pulse 63   Ht 5\' 3"  (1.6 m)   Wt 171 lb 9.6 oz (77.8 kg)   BMI 30.40 kg/m  Physical Exam  Constitutional: She is oriented to person, place, and time. She appears well-developed and well-nourished. No distress.  Healthy-appearing.  Well-groomed  HENT:  Head: Normocephalic and atraumatic.  Neck: Normal range of motion. Neck supple. No hepatojugular reflux and no JVD present. Carotid bruit is not present.  Cardiovascular: Normal rate, regular rhythm and intact distal pulses.  No extrasystoles are present. PMI is not displaced. Exam reveals no gallop and no friction rub.  Murmur heard.  Low-pitched harsh crescendo-decrescendo early systolic murmur is present with a grade of 1/6 at the upper right sternal border radiating to the neck. Pulmonary/Chest: Effort normal and breath  sounds normal. No respiratory distress. She has no wheezes. She has no rales.  Abdominal: Soft. Bowel sounds are normal. She exhibits no distension. There is no tenderness. There is no rebound.  Musculoskeletal: Normal range of motion. She exhibits no edema.  Neurological: She is alert and oriented to person, place, and time.  Psychiatric: She has a normal mood and affect. Her behavior is normal. Judgment and thought content normal.  Vitals reviewed.   Adult ECG Report  Rate: 63 ;  Rhythm: normal sinus rhythm and Normal axis, intervals durations;   Narrative Interpretation: n/a    Other studies Reviewed: Additional studies/ records that were reviewed today include:  Recent Labs: None available    ASSESSMENT / PLAN: Problem List Items Addressed This Visit    Aortic valve sclerosis (Chronic)    Soft murmur.  Not likely to  progress.  If murmur increases, would recheck echo.      Relevant Medications   losartan (COZAAR) 25 MG tablet   flecainide (TAMBOCOR) 100 MG tablet   diltiazem (CARDIZEM CD) 120 MG 24 hr capsule   Essential hypertension (Chronic)    Blood pressure looks great on current meds.  No change      Relevant Medications   losartan (COZAAR) 25 MG tablet   flecainide (TAMBOCOR) 100 MG tablet   diltiazem (CARDIZEM CD) 120 MG 24 hr capsule   Medication management   Relevant Orders   EKG 12-Lead   PAF (paroxysmal atrial fibrillation) ([place]); CHA2DS2Vasc =1 (not on AC) (Chronic)    No recurrent episodes on flecainide plus diltiazem.  She has plans for PRN additional dose of flecainide for breakthrough. Low risk.  Not on any regulation.  On aspirin.      Relevant Medications   losartan (COZAAR) 25 MG tablet   flecainide (TAMBOCOR) 100 MG tablet   diltiazem (CARDIZEM CD) 120 MG 24 hr capsule   Other Relevant Orders   EKG 12-Lead   Paroxysmal SVT (supraventricular tachycardia) ([facility]) - Primary (Chronic)    On stable dose of diltiazem and flecainide.  No further  recurrences.  Continue to avoid triggers.  Stay adequately hydrated.  Minimize caffeine.  Change diltiazem dose to p.m. in an effort to potentially avoid the exertional dyspnea climbing hills which is probably related to overcoming heart rate.  She if she takes her diltiazem after exercise, she is less likely to notice it.      Relevant Medications   losartan (COZAAR) 25 MG tablet   flecainide (TAMBOCOR) 100 MG tablet   diltiazem (CARDIZEM CD) 120 MG 24 hr capsule   Other Relevant Orders   EKG 12-Lead       I spent a total of 20 minutes with the patient and chart review. >  50% of the time was spent in direct patient consultation.   Current medicines are reviewed at length with the patient today.  (+/- concerns) n/a The following changes have been made:  n/a  Patient Instructions  MEDICATION CHANGES   TAKE DILTIAZEM IN THE EVENING    Your physician wants you to follow-up in [place]. You will receive a reminder letter in the mail two months in advance. If you don't receive a letter, please call our office to schedule the follow-up appointment.    If you need a refill on your cardiac medications before your next appointment, please call your pharmacy.    Studies Ordered:   Orders Placed This Encounter  Procedures  . EKG 12-Lead     Glenetta Hew, M.D., M.S. Interventional Cardiologist   Pager # [phone] Phone # [phone] [address]. [place], [place] [address]   Thank you for choosing [facility] at [facility]!!

## 2017-11-12 ENCOUNTER — Encounter: Payer: Self-pay | Admitting: Cardiology

## 2017-11-12 NOTE — Assessment & Plan Note (Signed)
No recurrent episodes on flecainide plus diltiazem.  She has plans for PRN additional dose of flecainide for breakthrough. Low risk.  Not on any regulation.  On aspirin.

## 2017-11-12 NOTE — Assessment & Plan Note (Signed)
Blood pressure looks great on current meds.  No change 

## 2017-11-12 NOTE — Assessment & Plan Note (Signed)
Soft murmur.  Not likely to progress.  If murmur increases, would recheck echo.

## 2017-11-12 NOTE — Assessment & Plan Note (Addendum)
On stable dose of diltiazem and flecainide.  No further recurrences.  Continue to avoid triggers.  Stay adequately hydrated.  Minimize caffeine.  Change diltiazem dose to p.m. in an effort to potentially avoid the exertional dyspnea climbing hills which is probably related to overcoming heart rate.  She if she takes her diltiazem after exercise, she is less likely to notice it.

## 2017-11-17 ENCOUNTER — Other Ambulatory Visit: Payer: Self-pay | Admitting: Family Medicine

## 2017-11-18 ENCOUNTER — Telehealth: Payer: Self-pay

## 2017-11-18 NOTE — Telephone Encounter (Signed)
Copied from [place] [id number]. Topic: General - Other >> Nov 18, 2017 11:09 AM Judyann Munson wrote: Reason for CRM: patient is calling to advise she was seen at her GI and stated the nurse advise she needed a colonoscopy and  that she was going to be put to sleep, and she has a few concerns in regards to this please advise. She is requesting a nurse give  her a call back please advise

## 2017-11-18 NOTE — Telephone Encounter (Signed)
Advise she may want to discuss with her cardiologist? O/w the GI office should go over risks with her in advance. If she wanted to talk with me personally help her to set up visit. Thanks.

## 2017-11-18 NOTE — Telephone Encounter (Signed)
I called the pt and informed her of the message below

## 2017-12-02 ENCOUNTER — Encounter: Payer: Self-pay | Admitting: Gastroenterology

## 2017-12-16 ENCOUNTER — Ambulatory Visit (AMBULATORY_SURGERY_CENTER): Payer: Managed Care, Other (non HMO) | Admitting: Gastroenterology

## 2017-12-16 ENCOUNTER — Encounter: Payer: Self-pay | Admitting: Gastroenterology

## 2017-12-16 VITALS — BP 129/65 | HR 54 | Temp 98.4°F | Resp 16 | Ht 63.0 in | Wt 171.0 lb

## 2017-12-16 DIAGNOSIS — K5730 Diverticulosis of large intestine without perforation or abscess without bleeding: Secondary | ICD-10-CM

## 2017-12-16 DIAGNOSIS — R197 Diarrhea, unspecified: Secondary | ICD-10-CM

## 2017-12-16 DIAGNOSIS — K52832 Lymphocytic colitis: Secondary | ICD-10-CM | POA: Diagnosis not present

## 2017-12-16 DIAGNOSIS — D123 Benign neoplasm of transverse colon: Secondary | ICD-10-CM | POA: Diagnosis not present

## 2017-12-16 DIAGNOSIS — Z1211 Encounter for screening for malignant neoplasm of colon: Secondary | ICD-10-CM

## 2017-12-16 MED ORDER — SODIUM CHLORIDE 0.9 % IV SOLN: 500.0000 mL | Freq: Once | INTRAVENOUS | Status: DC

## 2017-12-16 NOTE — Patient Instructions (Signed)
Continue present medications. Return to GI office in 1 month. Please read handouts provided.    YOU HAD AN ENDOSCOPIC PROCEDURE TODAY AT [facility]:   Refer to the procedure report that was given to you for any specific questions about what was found during the examination.  If the procedure report does not answer your questions, please call your gastroenterologist to clarify.  If you requested that your care partner not be given the details of your procedure findings, then the procedure report has been included in a sealed envelope for you to review at your convenience later.  YOU SHOULD EXPECT: Some feelings of bloating in the abdomen. Passage of more gas than usual.  Walking can help get rid of the air that was put into your GI tract during the procedure and reduce the bloating. If you had a lower endoscopy (such as a colonoscopy or flexible sigmoidoscopy) you may notice spotting of blood in your stool or on the toilet paper. If you underwent a bowel prep for your procedure, you may not have a normal bowel movement for a few days.  Please Note:  You might notice some irritation and congestion in your nose or some drainage.  This is from the oxygen used during your procedure.  There is no need for concern and it should clear up in a day or so.  SYMPTOMS TO REPORT IMMEDIATELY:   Following lower endoscopy (colonoscopy or flexible sigmoidoscopy):  Excessive amounts of blood in the stool  Significant tenderness or worsening of abdominal pains  Swelling of the abdomen that is new, acute  Fever of 100F or higher   For urgent or emergent issues, a gastroenterologist can be reached at any hour by calling [phone].   DIET:  We do recommend a small meal at first, but then you may proceed to your regular diet.  Drink plenty of fluids but you should avoid alcoholic beverages for 24 hours.  ACTIVITY:  You should plan to take it easy for the rest of today and you should NOT  DRIVE or use heavy machinery until tomorrow (because of the sedation medicines used during the test).    FOLLOW UP: Our staff will call the number listed on your records the next business day following your procedure to check on you and address any questions or concerns that you may have regarding the information given to you following your procedure. If we do not reach you, we will leave a message.  However, if you are feeling well and you are not experiencing any problems, there is no need to return our call.  We will assume that you have returned to your regular daily activities without incident.  If any biopsies were taken you will be contacted by phone or by letter within the next 1-3 weeks.  Please call us at [phone] if you have not heard about the biopsies in 3 weeks.    SIGNATURES/CONFIDENTIALITY: You and/or your care partner have signed paperwork which will be entered into your electronic medical record.  These signatures attest to the fact that that the information above on your After Visit Summary has been reviewed and is understood.  Full responsibility of the confidentiality of this discharge information lies with you and/or your care-partner.

## 2017-12-16 NOTE — Progress Notes (Signed)
Called to room to assist during endoscopic procedure.  Patient ID and intended procedure confirmed with present staff. Received instructions for my participation in the procedure from the performing physician.  

## 2017-12-16 NOTE — Progress Notes (Signed)
A and O x3. Report to RN. Tolerated MAC anesthesia well.

## 2017-12-16 NOTE — Op Note (Signed)
[place] Patient Name: Veronica Valencia Procedure Date: 12/16/2017 2:29 PM MRN: [id number] Endoscopist: Ladene Artist , MD Age: 62 Referring MD:  Date of Birth: 19-Mar-1955 Gender: Female Account #: [id number] Procedure:                Colonoscopy Indications:              Clinically significant diarrhea of unexplained                            origin Medicines:                Monitored Anesthesia Care Procedure:                Pre-Anesthesia Assessment:                           - Prior to the procedure, a History and Physical                            was performed, and patient medications and                            allergies were reviewed. The patient's tolerance of                            previous anesthesia was also reviewed. The risks                            and benefits of the procedure and the sedation                            options and risks were discussed with the patient.                            All questions were answered, and informed consent                            was obtained. Prior Anticoagulants: The patient has                            taken no previous anticoagulant or antiplatelet                            agents. ASA Grade Assessment: II - A patient with                            mild systemic disease. After reviewing the risks                            and benefits, the patient was deemed in                            satisfactory condition to undergo the procedure.  After obtaining informed consent, the colonoscope                            was passed under direct vision. Throughout the                            procedure, the patient's blood pressure, pulse, and                            oxygen saturations were monitored continuously. The                            Colonoscope was introduced through the anus and                            advanced to the the cecum, identified by               appendiceal orifice and ileocecal valve. The                            ileocecal valve, appendiceal orifice, and rectum                            were photographed. The quality of the bowel                            preparation was good. The colonoscopy was performed                            without difficulty. The patient tolerated the                            procedure well. Scope In: 2:39:32 PM Scope Out: 2:55:28 PM Scope Withdrawal Time: 0 hours 10 minutes 48 seconds  Total Procedure Duration: 0 hours 15 minutes 56 seconds  Findings:                 The perianal and digital rectal examinations were                            normal.                           A 7 mm polyp was found in the transverse colon. The                            polyp was sessile. The polyp was removed with a                            cold snare. Resection and retrieval were complete.                           Multiple medium-mouthed diverticula were found in  the sigmoid colon, descending colon and transverse                            colon. There was no evidence of diverticular                            bleeding.                           The exam was otherwise without abnormality on                            direct and retroflexion views. Random biopsies                            obtained throughout. Complications:            No immediate complications. Estimated blood loss:                            None. Estimated Blood Loss:     Estimated blood loss: none. Impression:               - One 7 mm polyp in the transverse colon, removed                            with a cold snare. Resected and retrieved.                           - Moderate diverticulosis in the sigmoid colon, in                            the descending colon and in the transverse colon.                           - The examination was otherwise normal on direct                             and retroflexion views. Recommendation:           - Repeat colonoscopy in 5 years for surveillance if                            polyp is precancerous, otherwise 10 years.                           - Patient has a contact number available for                            emergencies. The signs and symptoms of potential                            delayed complications were discussed with the                            patient. Return to normal activities  tomorrow.                            Written discharge instructions were provided to the                            patient.                           - Resume previous diet.                           - Continue present medications.                           - Await pathology results.                           - Return to GI office in 1 month. Ladene Artist, MD 12/16/2017 2:59:43 PM This report has been signed electronically.

## 2017-12-17 ENCOUNTER — Telehealth: Payer: Self-pay

## 2017-12-17 NOTE — Telephone Encounter (Signed)
  Follow up Call-  Call back number 12/16/2017  Post procedure Call Back phone  # [phone]  Permission to leave phone message Yes  Some recent data might be hidden     Patient questions:  Do you have a fever, pain , or abdominal swelling? No. Pain Score  0 *  Have you tolerated food without any problems? Yes.    Have you been able to return to your normal activities? Yes.    Do you have any questions about your discharge instructions: Diet   No. Medications  No. Follow up visit  No.  Do you have questions or concerns about your Care? No.  Actions: * If pain score is 4 or above: No action needed, pain <4.

## 2018-01-03 ENCOUNTER — Other Ambulatory Visit: Payer: Self-pay

## 2018-01-03 MED ORDER — BUDESONIDE 3 MG PO CPEP: 9.0000 mg | ORAL_CAPSULE | Freq: Every day | ORAL | 1 refills | Status: DC

## 2018-01-26 IMAGING — DX DG CHEST 1V PORT
1 series · 1 of 1 positions shown · non-contrast
Comparison: September 23, 2012

CLINICAL DATA: Cardiac palpitations

EXAM:
PORTABLE CHEST 1 VIEW

[chest ap]
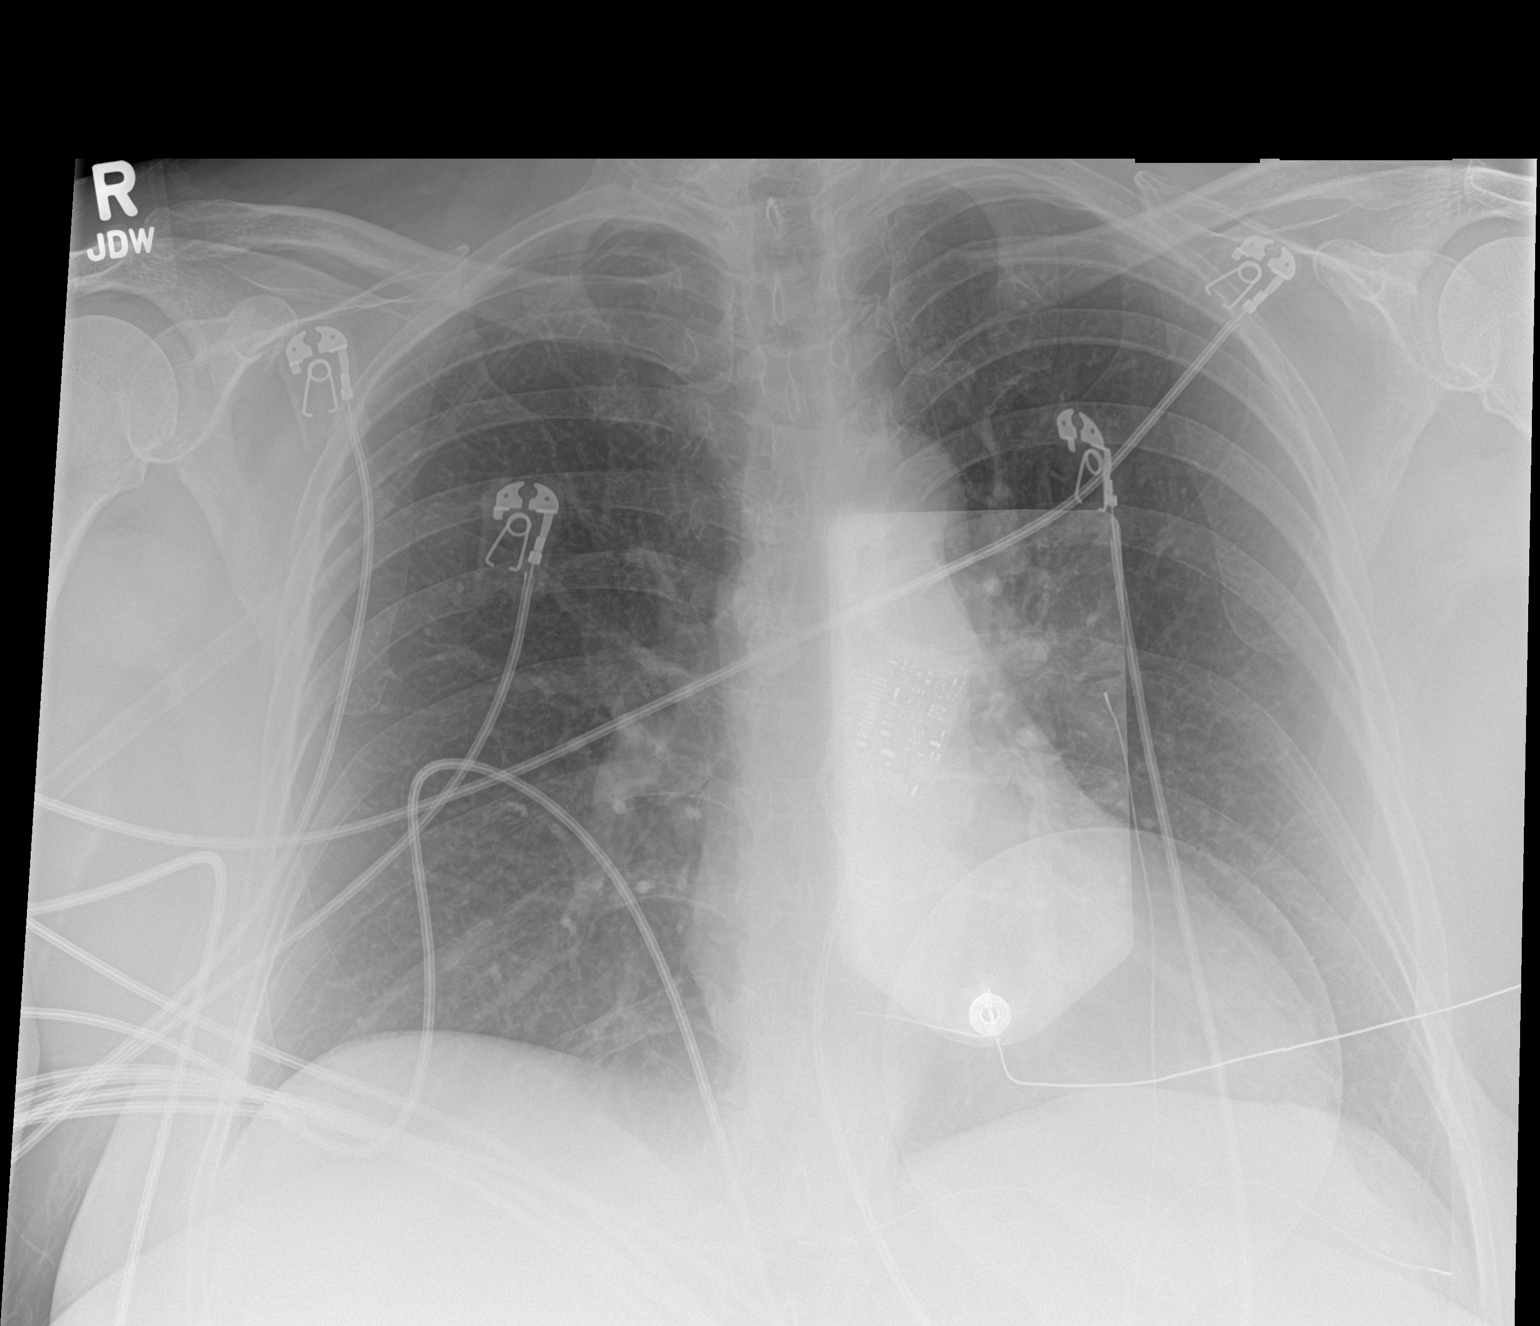

[1 of 1 positions shown; findings below may reference images not displayed]

FINDINGS: Overlying defibrillator paddle noted. There is no edema or
consolidation. Heart size and pulmonary vascularity are normal. No
adenopathy. Calcified azygos region lymph nodes indicates prior
granulomatous disease. No adenopathy. No pneumothorax. No bone
lesions.
IMPRESSION: No edema or consolidation.  Evidence of prior granulomatous disease.

## 2018-01-27 ENCOUNTER — Other Ambulatory Visit: Payer: Self-pay | Admitting: Gastroenterology

## 2018-02-07 ENCOUNTER — Other Ambulatory Visit: Payer: Self-pay | Admitting: Family Medicine

## 2018-02-13 ENCOUNTER — Other Ambulatory Visit: Payer: Self-pay | Admitting: Gastroenterology

## 2018-02-17 ENCOUNTER — Encounter: Payer: Self-pay | Admitting: Gastroenterology

## 2018-02-17 ENCOUNTER — Ambulatory Visit: Payer: Managed Care, Other (non HMO) | Admitting: Gastroenterology

## 2018-02-17 VITALS — BP 118/72 | HR 66 | Ht 63.0 in | Wt 178.0 lb

## 2018-02-17 DIAGNOSIS — K52832 Lymphocytic colitis: Secondary | ICD-10-CM | POA: Diagnosis not present

## 2018-02-17 NOTE — Progress Notes (Signed)
    History of Present Illness: This is a 62 year old female with lymphocytic colitis.  She states her diarrhea resolved on its own.  She never picked up the budesonide prescription.  Her diarrhea has not recurred.  She has no gastrointestinal complaints.  Colonoscopy 12/2017 - One 7 mm polyp in the transverse colon, removed with a cold snare. Resected and Retrieved. (polypoid mucosa) - Moderate diverticulosis in the sigmoid colon, in the descending colon and in the transverse colon. - The examination was otherwise normal on direct and retroflexion views. (lymphocyctic colitis)   Current Medications, Allergies, Past Medical History, Past Surgical History, Family History and Social History were reviewed in [employer] record.  Physical Exam: General: Well developed, well nourished, no acute distress Head: Normocephalic and atraumatic Eyes:  sclerae anicteric, EOMI Ears: Normal auditory acuity Mouth: No deformity or lesions Lungs: Clear throughout to auscultation Heart: Regular rate and rhythm; no murmurs, rubs or bruits Abdomen: Soft, non tender and non distended. No masses, hepatosplenomegaly or hernias noted. Normal Bowel sounds Rectal: Not done Musculoskeletal: Symmetrical with no gross deformities  Pulses:  Normal pulses noted Extremities: No clubbing, cyanosis, edema or deformities noted Neurological: Alert oriented x 4, grossly nonfocal Psychological:  Alert and cooperative. Normal mood and affect   Assessment and Recommendations:  1.  Lymphocytic colitis.  Diarrhea resolved without any intervention and it has not recurred.  We discussed the disease and the possibility that diarrhea could recur.  If her diarrhea is persistent we would again advise a course of budesonide.  I addressed her questions to her satisfaction.  Follow-up as needed.

## 2018-02-17 NOTE — Patient Instructions (Signed)
Thank you for choosing me and [facility].  Malcolm T. Stark, Jr., MD., FACG  

## 2018-03-13 ENCOUNTER — Ambulatory Visit: Payer: Managed Care, Other (non HMO) | Admitting: Family Medicine

## 2018-03-13 ENCOUNTER — Encounter: Payer: Self-pay | Admitting: Family Medicine

## 2018-03-13 VITALS — BP 122/80 | HR 65 | Temp 98.7°F | Ht 63.0 in | Wt 182.9 lb

## 2018-03-13 DIAGNOSIS — Z23 Encounter for immunization: Secondary | ICD-10-CM

## 2018-03-13 DIAGNOSIS — S51812A Laceration without foreign body of left forearm, initial encounter: Secondary | ICD-10-CM | POA: Diagnosis not present

## 2018-03-13 NOTE — Progress Notes (Signed)
HPI:  Using dictation device. Unfortunately this device frequently misinterprets words/phrases.  [place] 12/26 carring wine bottle to trash on vacation in [facility] - glass cut to forearm. Went to ER and had 9 stitches. Healed well. Here for removal. Also thinks has retain small piece of glass L pulm. Also due for tetanus booster as over 5 years with wound. No fevers, drainage, pain, swelling of wound.  ROS: See pertinent positives and negatives per HPI.  Past Medical History:  Diagnosis Date  . Abnormal exercise tolerance test 11/24/2012   Did not reach 85% maximum heart rate. No ischemic changes noted at 79%. Notably beta blocker was not held; no arrhythmias noted.  Marland Kitchen Episcleritis of right eye   . H/O echocardiogram OS 20/40   EF 60-65% with mild concentric LVH. Normal wall motion. Grade 1 diastolic dysfunction. Trace aortic regurgitation. No clear-cut evidence of murmur source  . Hot flashes   . Hypertension   . Lymphocytic colitis   . PAF (paroxysmal atrial fibrillation) [facility]) July 2014  . Paroxysmal atrial flutter [facility]) August 2014  . Paroxysmal SVT (supraventricular tachycardia) ([place]) 10/06/2012   This may have simply been misdiagnosed A. fib    Past Surgical History:  Procedure Laterality Date  . CESAREAN SECTION    . [place]  . WRIST SURGERY Left     Family History  Problem Relation Age of Onset  . Diabetes Mother   . Hypertension Mother   . Stroke Father   . Hypertension Father   . Heart attack Father 41  . Cancer Sister 63       breast  . Ulcerative colitis Son   . Breast cancer Neg Hx   . Colon cancer Neg Hx   . Esophageal cancer Neg Hx   . Rectal cancer Neg Hx   . Stomach cancer Neg Hx     SOCIAL HX: see hpi   Current Outpatient Medications:  .  CALCIUM PO, Take 1 tablet by mouth daily., Disp: , Rfl:  .  cholecalciferol (VITAMIN D) 400 units TABS tablet, Take 400 Units by mouth daily., Disp: , Rfl:  .  diltiazem (CARDIZEM CD) 120 MG  24 hr capsule, Take 1 capsule (120 mg total) by mouth every evening., Disp: 90 capsule, Rfl: 3 .  flecainide (TAMBOCOR) 100 MG tablet, Take 1 tablet (100 mg total) by mouth 2 (two) times daily., Disp: 180 tablet, Rfl: 3 .  FYAVOLV 1-5 MG-MCG TABS tablet, TAKE 1 TABLET BY MOUTH EVERY DAY, Disp: 84 tablet, Rfl: 0 .  Ibuprofen (ADVIL PO), Take 200 mg by mouth every 6 (six) hours as needed (pain). , Disp: , Rfl:  .  Melatonin 10 MG CAPS, Take 1 capsule by mouth daily., Disp: , Rfl:  .  losartan (COZAAR) 25 MG tablet, Take 1 tablet (25 mg total) by mouth daily., Disp: 90 tablet, Rfl: 3  EXAM:  Vitals:   03/13/18 1502  BP: 122/80  Pulse: 65  Temp: 98.7 F (37.1 C)    Body mass index is 32.4 kg/m.  GENERAL: vitals reviewed and listed above, alert, oriented, appears well hydrated and in no acute distress  HEENT: atraumatic, conjunttiva clear, no obvious abnormalities on inspection of external nose and ears  NECK: no obvious masses on inspection  SKIN: well healed lac L forearm with 9 sutures; small area of dried blood and skin thickness L pulm  MS: moves all extremities without noticeable abnormality  PSYCH: pleasant and cooperative, no obvious depression or  anxiety  ASSESSMENT AND PLAN:  Discussed the following assessment and plan:  Forearm laceration, left, initial encounter - Plan: Td vaccine greater than or equal to 7yo preservative free IM  -sutures removed (9), wound looks great, advise of wound care and return precautions -area of concern on pulm cleaned with alcohol and very superficial blunt dissection with blunt sterile needle resulted in removal small shard of glass - submilimeter in size, felt better after and tolerated well. No bleeding. Wound recs advised. Follow up as needed. -tetanus booster due for wound -physical when due -Patient advised to return or notify a doctor immediately if symptoms worsen or persist or new concerns arise.  Patient Instructions  BEFORE  YOU LEAVE: -review all hm due  -tetanus booster due -follow up: yearly for physical when due  Keep wounds clean. Apply small amount Aquaphor or antibacterial oint daily. Follow up if any concerns.   Lucretia Kern, DO

## 2018-03-13 NOTE — Patient Instructions (Signed)
BEFORE YOU LEAVE: -review all hm due  -tetanus booster due -follow up: yearly for physical when due  Keep wounds clean. Apply small amount Aquaphor or antibacterial oint daily. Follow up if any concerns.

## 2018-05-16 ENCOUNTER — Other Ambulatory Visit: Payer: Self-pay | Admitting: Cardiology

## 2018-05-16 ENCOUNTER — Other Ambulatory Visit: Payer: Self-pay | Admitting: *Deleted

## 2018-05-16 MED ORDER — LOSARTAN POTASSIUM 25 MG PO TABS: 25.0000 mg | ORAL_TABLET | Freq: Every day | ORAL | 3 refills | Status: DC

## 2018-05-16 MED ORDER — DILTIAZEM HCL ER COATED BEADS 120 MG PO CP24: 120.0000 mg | ORAL_CAPSULE | Freq: Every evening | ORAL | 3 refills | Status: DC

## 2018-05-16 NOTE — Telephone Encounter (Signed)
°*  STAT* If patient is at the pharmacy, call can be transferred to refill team.   1. Which medications need to be refilled? (please list name of each medication and dose if known)  losartan (COZAAR) 25 MG tablet diltiazem (CARDIZEM CD) 120 MG 24 hr capsule (pharmacy they can't get it, a different med is going to have to be prescribed to her)    2. Which pharmacy/location (including street and city if local pharmacy) is medication to be sent to? [id number] [place], [place] - [address]  3. Do they need a 30 day or 90 day supply? 90 days

## 2018-05-19 ENCOUNTER — Telehealth: Payer: Self-pay | Admitting: Cardiology

## 2018-05-19 MED ORDER — IRBESARTAN 75 MG PO TABS: 75.0000 mg | ORAL_TABLET | Freq: Every day | ORAL | 6 refills | Status: DC

## 2018-05-19 NOTE — Telephone Encounter (Signed)
New message  Pt c/o medication issue:  1. Name of Medication: losartan (COZAAR) 25 MG tablet  2. How are you currently taking this medication (dosage and times per day)? 1 time daily  3. Are you having a reaction (difficulty breathing--STAT)? No   4. What is your medication issue? [facility] and would like to see if can prescribe a medication that is equivalent to this medication.

## 2018-05-19 NOTE — Telephone Encounter (Signed)
Lm to call back ./cy 

## 2018-05-19 NOTE — Telephone Encounter (Signed)
Spoke with pt, she prefers the new prescription, she will monitor her bp. New script sent to the pharmacy

## 2018-05-19 NOTE — Telephone Encounter (Signed)
Our preference is for them to call around and see if anyone else has any.  If not, can switch to irbesartan 75 mg once daily - and check home BP readings daily for a week or two if able

## 2018-06-05 ENCOUNTER — Other Ambulatory Visit: Payer: Self-pay | Admitting: Family Medicine

## 2018-06-23 ENCOUNTER — Encounter: Payer: Self-pay | Admitting: Family Medicine

## 2018-06-23 ENCOUNTER — Ambulatory Visit (INDEPENDENT_AMBULATORY_CARE_PROVIDER_SITE_OTHER): Payer: Managed Care, Other (non HMO) | Admitting: Family Medicine

## 2018-06-23 ENCOUNTER — Other Ambulatory Visit: Payer: Self-pay

## 2018-06-23 DIAGNOSIS — I471 Supraventricular tachycardia: Secondary | ICD-10-CM | POA: Diagnosis not present

## 2018-06-23 DIAGNOSIS — I10 Essential (primary) hypertension: Secondary | ICD-10-CM

## 2018-06-23 NOTE — Patient Instructions (Signed)
Get a blood pressure cuff to monitor the blood pressure.  Let us know if running over 120/70 on average.

## 2018-06-23 NOTE — Progress Notes (Signed)
Virtual Visit via Video Note  I connected with Veronica Valencia  on 06/23/18 at 11:00 AM EDT by a video enabled telemedicine application and verified that I am speaking with the correct person using two identifiers.  Location patient: home Location provider:work or home office Persons participating in the virtual visit: patient, provider, patient's spouse  I discussed the limitations of evaluation and management by telemedicine and the availability of in person appointments. The patient expressed understanding and agreed to proceed.   HPI:  PMH lymphocytic colitis, hypertension, SVT. She sees GI and cardiology specialists. She has been doing well. Staying home in light of the [place] pandemic. [place] 3 miles per day. No CP, SOB, palpitations or heart concerns. No BP cuff, but thinking about getting one and wonders about recs for this.  ROS: See pertinent positives and negatives per HPI.  Past Medical History:  Diagnosis Date  . Abnormal exercise tolerance test 11/24/2012   Did not reach 85% maximum heart rate. No ischemic changes noted at 79%. Notably beta blocker was not held; no arrhythmias noted.  Marland Kitchen Episcleritis of right eye   . H/O echocardiogram OS 20/40   EF 60-65% with mild concentric LVH. Normal wall motion. Grade 1 diastolic dysfunction. Trace aortic regurgitation. No clear-cut evidence of murmur source  . Hot flashes   . Hypertension   . Lymphocytic colitis   . PAF (paroxysmal atrial fibrillation) [facility]) July 2014  . Paroxysmal atrial flutter [facility]) August 2014  . Paroxysmal SVT (supraventricular tachycardia) ([place]) 10/06/2012   This may have simply been misdiagnosed A. fib    Past Surgical History:  Procedure Laterality Date  . CESAREAN SECTION    . [place]  . WRIST SURGERY Left     Family History  Problem Relation Age of Onset  . Diabetes Mother   . Hypertension Mother   . Stroke Father   . Hypertension Father   . Heart attack Father 20  . Cancer Sister  70       breast  . Ulcerative colitis Son   . Breast cancer Neg Hx   . Colon cancer Neg Hx   . Esophageal cancer Neg Hx   . Rectal cancer Neg Hx   . Stomach cancer Neg Hx     SOCIAL HX: see hpi   Current Outpatient Medications:  .  CALCIUM PO, Take 1 tablet by mouth daily., Disp: , Rfl:  .  cholecalciferol (VITAMIN D) 400 units TABS tablet, Take 400 Units by mouth daily., Disp: , Rfl:  .  diltiazem (CARDIZEM CD) 120 MG 24 hr capsule, Take 1 capsule (120 mg total) by mouth every evening., Disp: 90 capsule, Rfl: 3 .  flecainide (TAMBOCOR) 100 MG tablet, Take 1 tablet (100 mg total) by mouth 2 (two) times daily., Disp: 180 tablet, Rfl: 3 .  FYAVOLV 1-5 MG-MCG TABS tablet, TAKE 1 TABLET BY MOUTH EVERY DAY, Disp: 84 tablet, Rfl: 0 .  irbesartan (AVAPRO) 75 MG tablet, Take 1 tablet (75 mg total) by mouth daily., Disp: 30 tablet, Rfl: 6 .  Melatonin 10 MG CAPS, Take 1 capsule by mouth daily., Disp: , Rfl:   EXAM:  VITALS per patient if applicable:  GENERAL: alert, oriented, appears well and in no acute distress  HEENT: atraumatic, conjunttiva clear, no obvious abnormalities on inspection of external nose and ears  NECK: normal movements of the head and neck  LUNGS: on inspection no signs of respiratory distress, breathing rate appears normal, no obvious gross SOB, gasping  or wheezing  CV: no obvious cyanosis  MS: moves all visible extremities without noticeable abnormality  PSYCH/NEURO: pleasant and cooperative, no obvious depression or anxiety, speech and thought processing grossly intact  ASSESSMENT AND PLAN:  Discussed the following assessment and plan:  Essential hypertension  Paroxysmal SVT (supraventricular tachycardia) (HCC)  Doing well.  Advised they get a BP cuff and discussed options. Discussed BP goals and advised they call us or cardiology if running high.  They are aware I will be leaving clinical practice and do wish to see Dr. Ethlyn Gallery.  I discussed the  assessment and treatment plan with the patient. The patient was provided an opportunity to ask questions and all were answered. The patient agreed with the plan and demonstrated an understanding of the instructions.   The patient was advised to call back or seek an in-person evaluation if the symptoms worsen or if the condition fails to improve as anticipated.   Follow up instructions: Advised assistant Wendie Simmer to help patient arrange the following: -TOC visit for her an her husband in 3-4 months  Lucretia Kern, DO

## 2018-08-25 ENCOUNTER — Other Ambulatory Visit: Payer: Self-pay | Admitting: Family Medicine

## 2018-09-07 ENCOUNTER — Other Ambulatory Visit: Payer: Self-pay | Admitting: Cardiology

## 2018-09-08 ENCOUNTER — Telehealth: Payer: Self-pay | Admitting: Family Medicine

## 2018-09-08 NOTE — Telephone Encounter (Signed)
Medication Refill - Medication:   FYAVOLV 1-5 MG-MCG TABS tablet      Has the patient contacted their pharmacy? Yes.   (Agent: If no, request that the patient contact the pharmacy for the refill.) (Agent: If yes, when and what did the pharmacy advise?)  Preferred Pharmacy (with phone number or street name):  [id number] [address], [place] [phone] (Phone) [phone] (Fax)     Agent: Please be advised that RX refills may take up to 3 business days. We ask that you follow-up with your pharmacy.     PATIENT ONLY HAS ONE PILL LEFT.

## 2018-09-09 ENCOUNTER — Other Ambulatory Visit: Payer: Self-pay | Admitting: Cardiology

## 2018-11-11 ENCOUNTER — Other Ambulatory Visit: Payer: Self-pay | Admitting: Cardiology

## 2018-12-24 ENCOUNTER — Telehealth (INDEPENDENT_AMBULATORY_CARE_PROVIDER_SITE_OTHER): Payer: Managed Care, Other (non HMO) | Admitting: Family Medicine

## 2018-12-24 ENCOUNTER — Encounter: Payer: Self-pay | Admitting: Family Medicine

## 2018-12-24 ENCOUNTER — Other Ambulatory Visit: Payer: Self-pay

## 2018-12-24 DIAGNOSIS — I471 Supraventricular tachycardia: Secondary | ICD-10-CM | POA: Diagnosis not present

## 2018-12-24 DIAGNOSIS — E538 Deficiency of other specified B group vitamins: Secondary | ICD-10-CM

## 2018-12-24 DIAGNOSIS — I10 Essential (primary) hypertension: Secondary | ICD-10-CM

## 2018-12-24 DIAGNOSIS — Z13220 Encounter for screening for lipoid disorders: Secondary | ICD-10-CM

## 2018-12-24 DIAGNOSIS — M25561 Pain in right knee: Secondary | ICD-10-CM | POA: Diagnosis not present

## 2018-12-24 DIAGNOSIS — R232 Flushing: Secondary | ICD-10-CM | POA: Diagnosis not present

## 2018-12-24 DIAGNOSIS — G8929 Other chronic pain: Secondary | ICD-10-CM

## 2018-12-24 DIAGNOSIS — M25562 Pain in left knee: Secondary | ICD-10-CM

## 2018-12-24 MED ORDER — NORETHINDRONE-ETH ESTRADIOL 1-5 MG-MCG PO TABS: 1.0000 | ORAL_TABLET | Freq: Every day | ORAL | 1 refills | Status: DC

## 2018-12-24 MED ORDER — IRBESARTAN 75 MG PO TABS: 75.0000 mg | ORAL_TABLET | Freq: Every day | ORAL | 1 refills | Status: DC

## 2018-12-24 MED ORDER — GLUCOSAMINE 500 MG PO CAPS: 500.0000 mg | ORAL_CAPSULE | Freq: Every morning | ORAL | 0 refills | Status: DC

## 2018-12-24 NOTE — Progress Notes (Signed)
Virtual Visit via Video Note  I connected with Veronica Valencia   on 12/24/18 at  1:00 PM EDT by a video enabled telemedicine application and verified that I am speaking with the correct person using two identifiers.  Location patient: home Location provider:work office Persons participating in the virtual visit: patient, provider  I discussed the limitations of evaluation and management by telemedicine and the availability of in person appointments. The patient expressed understanding and agreed to proceed.   Veronica Valencia DOB: [birth year] Encounter date: 12/24/2018  This is a 63 y.o. female who presents to establish care. No chief complaint on file.   History of present illness: Originally from [place] but lived out in [place] long time. 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 She retired a year ago.   PAF/htn: cardizem, flecainide, irbesartan. Follows with cardiology. Does get out of breath walking up hills. Feels like this is medication related since she has had this symptom with other medications. BP was 112/68 yesterday.   Hot flashes: fyavolv. Tried to go off of these for awhile, but was dripping wet, sweating terribly. 1mg  once daily.  Lymphocytic colitis: follows with GI. Last colonoscopy 12/2017 with single polyp.   Last pap 02/2016 normal, negative HPV repeat 02/2021  Has been getting some heartburn but tums helps with this just intermittently.   Other issue she has are knees. Both have gone out on her. Sees ortho. Wears knee braces - even sleeps in these and they help quite a bit. Tries to stretch now. Has received vitamins from friends and wants opinion on these.   Waiting on mammogram due to [place].   Multivitamin: Fish oil: 1200mg  B12: 1082mcg Melatonin at bedtime: seems to help her sleep. 10mg  Glucosamine:  Biotin: 508mcg   Past Medical History:  Diagnosis Date  . Abnormal exercise tolerance test 11/24/2012   Did not reach 85% maximum heart rate. No  ischemic changes noted at 79%. Notably beta blocker was not held; no arrhythmias noted.  Marland Kitchen Episcleritis of right eye   . H/O echocardiogram OS 20/40   EF 60-65% with mild concentric LVH. Normal wall motion. Grade 1 diastolic dysfunction. Trace aortic regurgitation. No clear-cut evidence of murmur source  . Hot flashes   . Hypertension   . Lymphocytic colitis   . PAF (paroxysmal atrial fibrillation) [facility]) July 2014  . Paroxysmal atrial flutter [facility]) August 2014  . Paroxysmal SVT (supraventricular tachycardia) ([place]) 10/06/2012   This may have simply been misdiagnosed A. fib  . Scleritis and episcleritis of right eye 08/28/2012   Past Surgical History:  Procedure Laterality Date  . CESAREAN SECTION    . [place]  . WRIST SURGERY Left    No Known Allergies No outpatient medications have been marked as taking for the 12/24/18 encounter (Telemedicine) with Caren Macadam, MD.   Social History   Tobacco Use  . Smoking status: Never Smoker  . Smokeless tobacco: Never Used  Substance Use Topics  . Alcohol use: Yes    Comment: 2 glasses wine daily   Family History  Problem Relation Age of Onset  . Diabetes Mother   . Hypertension Mother   . Stroke Father   . Hypertension Father   . Heart attack Father 4  . Cancer Sister 81       breast  . Ulcerative colitis Son   . Healthy Maternal Grandmother   . Alcohol abuse Maternal Grandfather   . Breast cancer Neg Hx   .  Colon cancer Neg Hx   . Esophageal cancer Neg Hx   . Rectal cancer Neg Hx   . Stomach cancer Neg Hx      Review of Systems  Constitutional: Negative for chills, fatigue and fever.  Respiratory: Negative for cough, chest tightness, shortness of breath and wheezing.   Cardiovascular: Negative for chest pain, palpitations and leg swelling.  Musculoskeletal: Positive for arthralgias (bilat knees).    Objective:  There were no vitals taken for this visit.      BP Readings from Last 3  Encounters:  03/13/18 122/80  02/17/18 118/72  12/16/17 129/65   Wt Readings from Last 3 Encounters:  03/13/18 182 lb 14.4 oz (83 kg)  02/17/18 178 lb (80.7 kg)  12/16/17 171 lb (77.6 kg)    EXAM:  GENERAL: alert, oriented, appears well and in no acute distress  HEENT: atraumatic, conjunctiva clear, no obvious abnormalities on inspection of external nose and ears  NECK: normal movements of the head and neck  LUNGS: on inspection no signs of respiratory distress, breathing rate appears normal, no obvious gross SOB, gasping or wheezing  CV: no obvious cyanosis  MS: moves all visible extremities without noticeable abnormality  PSYCH/NEURO: pleasant and cooperative, no obvious depression or anxiety, speech and thought processing grossly intact  SKIN: no facial or neck abnormalities  Assessment/Plan  1. Essential hypertension Has been stable, continue current medication. Follows with cardiology as well.  - irbesartan (AVAPRO) 75 MG tablet; Take 1 tablet (75 mg total) by mouth daily.  Dispense: 90 tablet; Refill: 1 - CBC with Differential/Platelet; Future - Comprehensive metabolic panel; Future  2. Paroxysmal SVT (supraventricular tachycardia) (HCC) Has been stable; tolerating mediations well. Rate has been controlled.   3. Hot flashes Unable to wean off of hormone replacement. Tolerates this well and controls hot flashes  4. Chronic pain of both knees Discussed dietary supplementation that she can try for knee pain. Does well with braces.   5. B12 deficiency - Vitamin B12; Future  6. Lipid screening - Lipid panel; Future  Return in about 3 months (around 03/26/2019) for physical exam.    I discussed the assessment and treatment plan with the patient. The patient was provided an opportunity to ask questions and all were answered. The patient agreed with the plan and demonstrated an understanding of the instructions.   The patient was advised to call back or seek an  in-person evaluation if the symptoms worsen or if the condition fails to improve as anticipated.  I provided 30 minutes of non-face-to-face time during this encounter.   Micheline Rough, MD

## 2018-12-25 ENCOUNTER — Telehealth: Payer: Self-pay | Admitting: *Deleted

## 2018-12-25 NOTE — Telephone Encounter (Signed)
-----   Message from Caren Macadam, MD sent at 12/24/2018  1:53 PM EDT ----- Please set up bloodwork in 3 mo followed by physical in office

## 2018-12-25 NOTE — Telephone Encounter (Signed)
I left a detailed message at the pts cell number to call for appts as below.

## 2019-02-20 ENCOUNTER — Other Ambulatory Visit: Payer: Self-pay | Admitting: Cardiology

## 2019-05-18 ENCOUNTER — Other Ambulatory Visit: Payer: Self-pay | Admitting: Cardiology

## 2019-05-18 ENCOUNTER — Other Ambulatory Visit: Payer: Self-pay | Admitting: Family Medicine

## 2019-05-18 DIAGNOSIS — Z1231 Encounter for screening mammogram for malignant neoplasm of breast: Secondary | ICD-10-CM

## 2019-05-27 ENCOUNTER — Other Ambulatory Visit: Payer: Self-pay

## 2019-05-28 ENCOUNTER — Encounter: Payer: Self-pay | Admitting: Internal Medicine

## 2019-05-28 ENCOUNTER — Ambulatory Visit (INDEPENDENT_AMBULATORY_CARE_PROVIDER_SITE_OTHER): Payer: BC Managed Care – PPO | Admitting: Internal Medicine

## 2019-05-28 VITALS — BP 150/90 | HR 56 | Temp 97.7°F | Wt 178.9 lb

## 2019-05-28 DIAGNOSIS — H1032 Unspecified acute conjunctivitis, left eye: Secondary | ICD-10-CM

## 2019-05-28 NOTE — Patient Instructions (Signed)
-  Nice seeing you today!!  -May use natural tears in gel form as needed and olopatadine eye drops 2 drops once or twice a day.  -We have secured follow up with your ophthalmologist for tomorrow.

## 2019-05-28 NOTE — Progress Notes (Signed)
Acute Office Visit     This visit occurred during the SARS-CoV-2 public health emergency.  Safety protocols were in place, including screening questions prior to the visit, additional usage of staff PPE, and extensive cleaning of exam room while observing appropriate contact time as indicated for disinfecting solutions.    CC/Reason for Visit: Left eye pain and redness  HPI: Fadra Zegar is a 64 y.o. female who is coming in today for the above mentioned reasons.  For the past week she has noticed increased pain and redness of her left eye.  She has not tried any over-the-counter drops or medications.  Her eye feels dry and red.  She does not wear contacts.  She has been having a throbbing pain behind her eye, sometimes a sharp pain right above her left eyebrow, she has a headache at the end of the day.  She does not have a personal or family history of glaucoma that she is aware of.  She does not have purulent drainage, at times during the morning her eyes will be closed shut but with a clear fluid.  She does not have any other URI symptoms, no sick contacts.  She denies any vision changes.   Past Medical/Surgical History: Past Medical History:  Diagnosis Date  . Abnormal exercise tolerance test 11/24/2012   Did not reach 85% maximum heart rate. No ischemic changes noted at 79%. Notably beta blocker was not held; no arrhythmias noted.  Marland Kitchen Episcleritis of right eye   . H/O echocardiogram OS 20/40   EF 60-65% with mild concentric LVH. Normal wall motion. Grade 1 diastolic dysfunction. Trace aortic regurgitation. No clear-cut evidence of murmur source  . Hot flashes   . Hypertension   . Lymphocytic colitis   . PAF (paroxysmal atrial fibrillation) [facility]) July 2014  . Paroxysmal atrial flutter [facility]) August 2014  . Paroxysmal SVT (supraventricular tachycardia) ([place]) 10/06/2012   This may have simply been misdiagnosed A. fib  . Scleritis and episcleritis of right eye 08/28/2012    Past  Surgical History:  Procedure Laterality Date  . CESAREAN SECTION    . [place]  . WRIST SURGERY Left     Social History:  reports that she has never smoked. She has never used smokeless tobacco. She reports current alcohol use. She reports that she does not use drugs.  Allergies: No Known Allergies  Family History:  Family History  Problem Relation Age of Onset  . Diabetes Mother   . Hypertension Mother   . Stroke Father   . Hypertension Father   . Heart attack Father 93  . Cancer Sister 20       breast  . Ulcerative colitis Son   . Healthy Maternal Grandmother   . Alcohol abuse Maternal Grandfather   . Breast cancer Neg Hx   . Colon cancer Neg Hx   . Esophageal cancer Neg Hx   . Rectal cancer Neg Hx   . Stomach cancer Neg Hx      Current Outpatient Medications:  .  CALCIUM PO, Take 1 tablet by mouth daily., Disp: , Rfl:  .  cholecalciferol (VITAMIN D) 400 units TABS tablet, Take 400 Units by mouth daily., Disp: , Rfl:  .  diltiazem (CARDIZEM CD) 120 MG 24 hr capsule, TAKE 1 CAPSULE (120 MG TOTAL) BY MOUTH EVERY EVENING., Disp: 90 capsule, Rfl: 3 .  flecainide (TAMBOCOR) 100 MG tablet, TAKE 1 TABLET BY MOUTH TWICE A DAY, Disp: 180  tablet, Rfl: 1 .  Glucosamine 500 MG CAPS, Take 1 capsule (500 mg total) by mouth every morning., Disp: , Rfl: 0 .  irbesartan (AVAPRO) 75 MG tablet, Take 1 tablet (75 mg total) by mouth daily., Disp: 90 tablet, Rfl: 1 .  Melatonin 10 MG CAPS, Take 1 capsule by mouth daily., Disp: , Rfl:  .  norethindrone-ethinyl estradiol (FYAVOLV) 1-5 MG-MCG TABS tablet, Take 1 tablet by mouth daily., Disp: 84 tablet, Rfl: 1  Review of Systems:  Constitutional: Denies fever, chills, diaphoresis, appetite change and fatigue.  HEENT: Denies photophobia,  hearing loss, ear pain, congestion, sore throat, rhinorrhea, sneezing, mouth sores, trouble swallowing, neck pain, neck stiffness and tinnitus.   Respiratory: Denies SOB, DOE, cough, chest  tightness,  and wheezing.   Cardiovascular: Denies chest pain, palpitations and leg swelling.  Gastrointestinal: Denies nausea, vomiting, abdominal pain, diarrhea, constipation, blood in stool and abdominal distention.  Genitourinary: Denies dysuria, urgency, frequency, hematuria, flank pain and difficulty urinating.  Endocrine: Denies: hot or cold intolerance, sweats, changes in hair or nails, polyuria, polydipsia. Musculoskeletal: Denies myalgias, back pain, joint swelling, arthralgias and gait problem.  Skin: Denies pallor, rash and wound.  Neurological: Denies dizziness, seizures, syncope, weakness, light-headedness, numbness and headaches.  Hematological: Denies adenopathy. Easy bruising, personal or family bleeding history  Psychiatric/Behavioral: Denies suicidal ideation, mood changes, confusion, nervousness, sleep disturbance and agitation    Physical Exam: Vitals:   05/28/19 1439  BP: (!) 150/90  Pulse: (!) 56  Temp: 97.7 F (36.5 C)  TempSrc: Temporal  SpO2: 98%  Weight: 178 lb 14.4 oz (81.1 kg)    Body mass index is 31.69 kg/m.   Constitutional: NAD, calm, comfortable Eyes: PERRL, significant scleral and conjunctival erythema on the left, no drainage visible. ENMT: Mucous membranes are moist.  Psychiatric: Normal judgment and insight. Alert and oriented x 3. Normal mood.    Impression and Plan:  Acute conjunctivitis of left eye, unspecified acute conjunctivitis type -Unclear etiology, suspect allergic although interesting that it is only the left side. -Doubt bacterial given lack of purulence. -I am somewhat concerned about her headaches and throbbing pain.  This makes me wonder about possibility of glaucoma. -We have called her ophthalmology office and have been able to secure an appointment for her tomorrow. -In the meantime she can use natural tears/olopatadine eyedrops.   Patient Instructions  -Nice seeing you today!!  -May use natural tears in gel form  as needed and olopatadine eye drops 2 drops once or twice a day.  -We have secured follow up with your ophthalmologist for tomorrow.     Lelon Frohlich, MD [facility] at [facility]

## 2019-05-29 DIAGNOSIS — H1032 Unspecified acute conjunctivitis, left eye: Secondary | ICD-10-CM | POA: Diagnosis not present

## 2019-06-28 ENCOUNTER — Other Ambulatory Visit: Payer: Self-pay | Admitting: Family Medicine

## 2019-06-28 DIAGNOSIS — I10 Essential (primary) hypertension: Secondary | ICD-10-CM

## 2019-06-29 ENCOUNTER — Ambulatory Visit (INDEPENDENT_AMBULATORY_CARE_PROVIDER_SITE_OTHER): Payer: BC Managed Care – PPO | Admitting: Cardiology

## 2019-06-29 ENCOUNTER — Encounter: Payer: Self-pay | Admitting: Cardiology

## 2019-06-29 ENCOUNTER — Other Ambulatory Visit: Payer: Self-pay

## 2019-06-29 VITALS — BP 120/74 | HR 64 | Temp 97.3°F | Ht 63.0 in | Wt 177.0 lb

## 2019-06-29 DIAGNOSIS — I4589 Other specified conduction disorders: Secondary | ICD-10-CM

## 2019-06-29 DIAGNOSIS — I471 Supraventricular tachycardia: Secondary | ICD-10-CM | POA: Diagnosis not present

## 2019-06-29 DIAGNOSIS — I10 Essential (primary) hypertension: Secondary | ICD-10-CM

## 2019-06-29 DIAGNOSIS — I480 Paroxysmal atrial fibrillation: Secondary | ICD-10-CM | POA: Diagnosis not present

## 2019-06-29 NOTE — Patient Instructions (Signed)
Medication Instructions:  No changes *If you need a refill on your cardiac medications before your next appointment, please call your pharmacy*   Lab Work: Not needed   Testing/Procedures: Not needed  Follow-Up: At [facility], you and your health needs are our priority.  As part of our continuing mission to provide you with exceptional heart care, we have created designated Provider Care Teams.  These Care Teams include your primary Cardiologist (physician) and Advanced Practice Providers (APPs -  Physician Assistants and Nurse Practitioners) who all work together to provide you with the care you need, when you need it.  We recommend signing up for the patient portal called "MyChart".  Sign up information is provided on this After Visit Summary.  MyChart is used to connect with patients for Virtual Visits (Telemedicine).  Patients are able to view lab/test results, encounter notes, upcoming appointments, etc.  Non-urgent messages can be sent to your provider as well.   To learn more about what you can do with MyChart, go to [email or web address].    Your next appointment:   12 month(s)  The format for your next appointment:   In Person  Provider:   Glenetta Hew, MD   Other Instructions n/a

## 2019-06-29 NOTE — Progress Notes (Signed)
Primary Care Provider: Caren Macadam, MD Cardiologist: No primary care provider on file. Electrophysiologist: None  Clinic Note: Chief Complaint  Patient presents with  . Follow-up    No major issues  . Tachycardia    no recent breakthrough    HPI:    Veronica Valencia is a 64 y.o. female with a PMH notable for PSVT-borderline P AF below who presents today for 63-month follow-up.  Veronica Valencia was last seen in September 2019.  She was in great spirits.  Do multiple travel trips.  She had just retired from [employer] for [employer].  Was asymptomatic with no recurrent breakthrough palpitation spells. -> Maintained on 100 mg twice daily flecainide plus low-dose diltiazem XT.  Recent Hospitalizations: None  Reviewed  CV studies:    The following studies were reviewed today: (if available, images/films reviewed: From Epic Chart or Care Everywhere) . None:   Interval History:   Veronica Valencia returns here today overall doing quite well.  Has not had any recurrent episodes of breakthrough palpitations.  She has a few skipped beats here and there but nothing prolonged. Dizziness has had is related to vertigo.  No syncope or near syncope.  Despite all the COVID-19 stress, she has not had any breakthrough spells since being on flecainide at current dose.  CV Review of Symptoms (Summary) Cardiovascular ROS: no chest pain or dyspnea on exertion positive for - May be short of breath if she walks quickly up a steep hill or stairs, but not with routine activity. negative for - edema, irregular heartbeat, orthopnea, palpitations, paroxysmal nocturnal dyspnea, rapid heart rate, shortness of breath or Syncope/near syncope, TIA/amaurosis fugax, claudication Occasional vertigo symptoms.  The patient does not have symptoms concerning for COVID-19 infection (fever, chills, cough, or new shortness of breath).  The patient is practicing social distancing & Masking.   She has had both of her  COVID-19 vaccine injections.  REVIEWED OF SYSTEMS   Review of Systems  Constitutional: Negative for malaise/fatigue and weight loss.  HENT: Negative for congestion and nosebleeds.   Respiratory: Negative for shortness of breath.   Gastrointestinal: Negative for blood in stool and melena.  Genitourinary: Negative for hematuria.  Musculoskeletal: Positive for joint pain. Negative for falls.  Neurological: Positive for dizziness (Occasional vertigo).  Psychiatric/Behavioral: Negative.      I have reviewed and (if needed) personally updated the patient's problem list, medications, allergies, past medical and surgical history, social and family history.   PAST MEDICAL HISTORY   Past Medical History:  Diagnosis Date  . Abnormal exercise tolerance test 11/24/2012   Did not reach 85% maximum heart rate. No ischemic changes noted at 79%. Notably beta blocker was not held; no arrhythmias noted.  Marland Kitchen Episcleritis of right eye   . H/O echocardiogram OS 20/40   EF 60-65% with mild concentric LVH. Normal wall motion. Grade 1 diastolic dysfunction. Trace aortic regurgitation. No clear-cut evidence of murmur source  . Hot flashes   . Hypertension   . Lymphocytic colitis   . PAF (paroxysmal atrial fibrillation) [facility]) July 2014  . Paroxysmal atrial flutter [facility]) August 2014  . Paroxysmal SVT (supraventricular tachycardia) ([place]) 10/06/2012   This may have simply been misdiagnosed A. fib  . Scleritis and episcleritis of right eye 08/28/2012    PAST SURGICAL HISTORY   Past Surgical History:  Procedure Laterality Date  . CESAREAN SECTION    . [place]  . WRIST SURGERY Left     MEDICATIONS/ALLERGIES  Current Meds  Medication Sig  . CALCIUM PO Take 1 tablet by mouth daily.  . cholecalciferol (VITAMIN D) 400 units TABS tablet Take 400 Units by mouth daily.  Marland Kitchen diltiazem (CARDIZEM CD) 120 MG 24 hr capsule TAKE 1 CAPSULE (120 MG TOTAL) BY MOUTH EVERY EVENING.  . flecainide  (TAMBOCOR) 100 MG tablet TAKE 1 TABLET BY MOUTH TWICE A DAY  . Glucosamine 500 MG CAPS Take 1 capsule (500 mg total) by mouth every morning.  . Melatonin 10 MG CAPS Take 1 capsule by mouth daily.  . norethindrone-ethinyl estradiol (FYAVOLV) 1-5 MG-MCG TABS tablet Take 1 tablet by mouth daily.  . [DISCONTINUED] irbesartan (AVAPRO) 75 MG tablet Take 1 tablet (75 mg total) by mouth daily.    No Known Allergies  SOCIAL HISTORY/FAMILY HISTORY   Reviewed in Epic:  Pertinent findings:  Social History   Social History Narrative   She is a married mother of 3.    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    OBJCTIVE -PE, EKG, labs   Wt Readings from Last 3 Encounters:  06/29/19 177 lb (80.3 kg)  05/28/19 178 lb 14.4 oz (81.1 kg)  03/13/18 182 lb 14.4 oz (83 kg)    Physical Exam: BP 120/74   Pulse 64   Temp (!) 97.3 F (36.3 C)   Ht 5\' 3"  (1.6 m)   Wt 177 lb (80.3 kg)   SpO2 97%   BMI 31.35 kg/m  Physical Exam  Constitutional: She is oriented to person, place, and time. She appears well-developed and well-nourished. No distress.  Well-groomed.  Healthy-appearing.  HENT:  Head: Normocephalic and atraumatic.  Neck: No hepatojugular reflux and no JVD present. Carotid bruit is not present.  Cardiovascular: Normal rate, regular rhythm, normal heart sounds and intact distal pulses.  No extrasystoles are present. PMI is not displaced. Exam reveals no gallop and no friction rub.  No murmur heard. Pulmonary/Chest: Effort normal and breath sounds normal. No respiratory distress. She has no wheezes. She has no rales.  Musculoskeletal:        General: No edema. Normal range of motion.     Cervical back: Normal range of motion and neck supple.    Neurological: She is alert and oriented to person, place, and time.  Psychiatric: She has a normal mood and affect. Her behavior is normal. Judgment and thought content normal.  Vitals reviewed.   Adult ECG Report  Rate: 64 ;  Rhythm: normal sinus rhythm and Incomplete RBBB.  Otherwise normal axis intervals durations;   Narrative Interpretation: Normal EKG.  Recent Labs: No labs available Lab Results  Component Value Date   CHOL 202 (H) 02/09/2016   HDL 74.10 02/09/2016   LDLCALC 102 (H) 05/07/2014   TRIG 72.0 05/07/2014   CHOLHDL 3 05/07/2014   Lab Results  Component Value Date   CREATININE 0.84 03/28/2016   BUN 12 03/28/2016   NA 132 (L) 03/28/2016   K 4.7 03/28/2016   CL 95 (L) 03/28/2016   CO2 28 03/28/2016   Lab Results  Component Value Date   TSH 1.86 12/07/2016    ASSESSMENT/PLAN    Problem List Items Addressed This Visit    Paroxysmal SVT (supraventricular tachycardia) (HCC) - Primary (Chronic)  She tried going to as needed diltiazem, and had issues.  Therefore is on diltiazem plus flecainide.  Minimizing caffeine and staying adequately hydrated.  Seems to be tolerating the diltiazem much more now that she has been on it for a while.  We discussed vagal maneuvers.      Relevant Orders   EKG 12-Lead (Completed)   PAF (paroxysmal atrial fibrillation) ([place]); CHA2DS2Vasc =1 (not on AC) (Chronic)    No recurrent episodes while on flecainide and diltiazem. We discussed as needed use of additional flecainide for breakthrough.  This patients CHA2DS2-VASc Score and unadjusted Ischemic Stroke Rate (% per year) is equal to 2.2 % stroke rate/year from a score of 2  Above score calculated as 1 point each if present [CHF, HTN, DM, Vascular=MI/PAD/Aortic Plaque, Age if 65-74, or Female] Above score calculated as 2 points each if present [Age > 75, or Stroke/TIA/TE]   Not on anticoagulation of present, on aspirin -> as she gets closer to 9, or if she has breakthrough  spells, would consider changing to DOAC      Relevant Orders   EKG 12-Lead (Completed)   Chronotropic incompetence with beta blockers (Chronic)    Doing better with Gentle blocker as opposed to beta-blocker.      Relevant Orders   EKG 12-Lead (Completed)   Essential hypertension (Chronic)    Is now on Avapro plus diltiazem.  Blood pressure well controlled.          COVID-19 Education: The signs and symptoms of COVID-19 were discussed with the patient and how to seek care for testing (follow up with PCP or arrange E-visit).   The importance of social distancing and COVID-19 vaccination was discussed today.  I spent a total of 46minutes with the patient. >  50% of the time was spent in direct patient consultation.  Additional time spent with chart review  / charting (studies, outside notes, etc): 8 Total Time: 24 min   Current medicines are reviewed at length with the patient today.  (+/- concerns) n/a  Notice: This dictation was prepared with Dragon dictation along with smaller phrase technology. Any transcriptional errors that result from this process are unintentional and may not be corrected upon review.  Patient Instructions / Medication Changes & Studies & Tests Ordered   Patient Instructions  Medication Instructions:  No changes *If you need a refill on your cardiac medications before your next appointment, please call your pharmacy*   Lab Work: Not needed   Testing/Procedures: Not needed  Follow-Up: At [facility], you and your health needs are our priority.  As part of our continuing mission to provide you with exceptional heart care, we have created designated Provider Care Teams.  These Care Teams include your primary Cardiologist (physician) and Advanced Practice Providers (APPs -  Physician Assistants and Nurse Practitioners) who all work together to provide you with the care you need, when you need it.  We recommend signing up for the patient portal  called "MyChart".  Sign up information is provided on this After Visit Summary.  MyChart is used to connect with patients for Virtual Visits (Telemedicine).  Patients are able to view lab/test results, encounter notes, upcoming appointments, etc.  Non-urgent messages can be sent to your provider as well.   To learn more about what you can do with MyChart, go to [email or web address].    Your next appointment:   12 month(s)  The format for your next appointment:   In Person  Provider:  Glenetta Hew, MD   Other Instructions n/a    Studies Ordered:   Orders Placed This Encounter  Procedures  . EKG 12-Lead     Glenetta Hew, M.D., M.S. Interventional Cardiologist   Pager # [phone] Phone # [phone] [address] [place], [place] [address]   Thank you for choosing Heartcare at [facility]!!

## 2019-07-05 ENCOUNTER — Encounter: Payer: Self-pay | Admitting: Cardiology

## 2019-07-05 NOTE — Assessment & Plan Note (Signed)
She tried going to as needed diltiazem, and had issues.  Therefore is on diltiazem plus flecainide.  Minimizing caffeine and staying adequately hydrated.  Seems to be tolerating the diltiazem much more now that she has been on it for a while.  We discussed vagal maneuvers.

## 2019-07-05 NOTE — Assessment & Plan Note (Signed)
Doing better with Gentle blocker as opposed to beta-blocker.

## 2019-07-05 NOTE — Assessment & Plan Note (Signed)
Is now on Avapro plus diltiazem.  Blood pressure well controlled.

## 2019-07-05 NOTE — Assessment & Plan Note (Addendum)
No recurrent episodes while on flecainide and diltiazem. We discussed as needed use of additional flecainide for breakthrough.  This patients CHA2DS2-VASc Score and unadjusted Ischemic Stroke Rate (% per year) is equal to 2.2 % stroke rate/year from a score of 2  Above score calculated as 1 point each if present [CHF, HTN, DM, Vascular=MI/PAD/Aortic Plaque, Age if 65-74, or Female] Above score calculated as 2 points each if present [Age > 75, or Stroke/TIA/TE]   Not on anticoagulation of present, on aspirin -> as she gets closer to 65, or if she has breakthrough spells, would consider changing to [place]

## 2019-07-10 ENCOUNTER — Telehealth: Payer: Self-pay | Admitting: Cardiology

## 2019-07-10 NOTE — Telephone Encounter (Signed)
New message  Patient is wanting to do a transfer of care from Dr. Ellyn Hack to Dr. Tamala Julian. Patient states that she has been recommended by a higher up in the [facility] to do the transfer to Dr. Tamala Julian as well as it is a personal preference. Please let me know if this transfer is ok to do.

## 2019-07-13 NOTE — Telephone Encounter (Signed)
I am fine with that 

## 2019-07-14 ENCOUNTER — Encounter (HOSPITAL_COMMUNITY): Payer: BC Managed Care – PPO

## 2019-07-14 NOTE — Telephone Encounter (Signed)
Follow Up  Dr. Tamala Julian, could you please confirm if it is ok to schedule appointment with you.

## 2019-07-15 ENCOUNTER — Ambulatory Visit
Admission: RE | Admit: 2019-07-15 | Discharge: 2019-07-15 | Disposition: A | Discharge status: Home / Self Care | Payer: Managed Care, Other (non HMO) | Source: Ambulatory Visit | Attending: Family Medicine | Admitting: Family Medicine

## 2019-07-15 ENCOUNTER — Other Ambulatory Visit: Payer: Self-pay

## 2019-07-15 DIAGNOSIS — Z1231 Encounter for screening mammogram for malignant neoplasm of breast: Secondary | ICD-10-CM

## 2019-07-15 NOTE — Telephone Encounter (Signed)
Okay 

## 2019-07-20 DIAGNOSIS — I83813 Varicose veins of bilateral lower extremities with pain: Secondary | ICD-10-CM | POA: Diagnosis not present

## 2019-08-08 ENCOUNTER — Other Ambulatory Visit: Payer: Self-pay | Admitting: Family Medicine

## 2019-08-17 DIAGNOSIS — I83813 Varicose veins of bilateral lower extremities with pain: Secondary | ICD-10-CM | POA: Diagnosis not present

## 2019-08-25 ENCOUNTER — Other Ambulatory Visit: Payer: Self-pay | Admitting: Cardiology

## 2019-09-16 ENCOUNTER — Encounter: Payer: Self-pay | Admitting: Family Medicine

## 2019-09-16 ENCOUNTER — Ambulatory Visit (INDEPENDENT_AMBULATORY_CARE_PROVIDER_SITE_OTHER): Payer: BC Managed Care – PPO | Admitting: Family Medicine

## 2019-09-16 ENCOUNTER — Other Ambulatory Visit: Payer: Self-pay

## 2019-09-16 VITALS — BP 108/64 | HR 58 | Temp 98.2°F | Ht 62.25 in | Wt 169.2 lb

## 2019-09-16 DIAGNOSIS — I83813 Varicose veins of bilateral lower extremities with pain: Secondary | ICD-10-CM | POA: Diagnosis not present

## 2019-09-16 DIAGNOSIS — Z0000 Encounter for general adult medical examination without abnormal findings: Secondary | ICD-10-CM

## 2019-09-16 DIAGNOSIS — I10 Essential (primary) hypertension: Secondary | ICD-10-CM

## 2019-09-16 DIAGNOSIS — R350 Frequency of micturition: Secondary | ICD-10-CM | POA: Diagnosis not present

## 2019-09-16 DIAGNOSIS — Z131 Encounter for screening for diabetes mellitus: Secondary | ICD-10-CM | POA: Diagnosis not present

## 2019-09-16 DIAGNOSIS — I480 Paroxysmal atrial fibrillation: Secondary | ICD-10-CM

## 2019-09-16 DIAGNOSIS — Z13220 Encounter for screening for lipoid disorders: Secondary | ICD-10-CM | POA: Diagnosis not present

## 2019-09-16 DIAGNOSIS — H6123 Impacted cerumen, bilateral: Secondary | ICD-10-CM | POA: Diagnosis not present

## 2019-09-16 DIAGNOSIS — E538 Deficiency of other specified B group vitamins: Secondary | ICD-10-CM

## 2019-09-16 DIAGNOSIS — I8311 Varicose veins of right lower extremity with inflammation: Secondary | ICD-10-CM | POA: Diagnosis not present

## 2019-09-16 DIAGNOSIS — R232 Flushing: Secondary | ICD-10-CM

## 2019-09-16 DIAGNOSIS — I8312 Varicose veins of left lower extremity with inflammation: Secondary | ICD-10-CM | POA: Diagnosis not present

## 2019-09-16 NOTE — Patient Instructions (Signed)
*  try taking half of hormone pill alternating with full tab and see how you do with this. Let me know if you want to decrease dose after trying this.

## 2019-09-16 NOTE — Progress Notes (Signed)
Veronica Valencia DOB: September 02, 1955 Encounter date: 09/16/2019  This is a 64 y.o. female who presents for complete physical   History of present illness/Additional concerns: PAF/htn: cardizem, flecainide, irbesartan. Follows with cardiology. Does get out of breath walking up hills. Feels like this is medication related since she has had this symptom with other medications. BP did get low other day - was outside working in sun; when came in was a little light headed, dizzy and pressure was 90's/60's. Usually closer to 122/70 or in that range. Was just taking once a week. She has been doing Noom - started in first week of January. Does feel like breathing is better since losing weight - still with some SOB up stairs. No tachychardia.   Knees are doing better with weight loss.   Hot flashes: fyavolv. Tried to go off of these for awhile, but was dripping wet, sweating terribly. 1mg  once daily.   Lymphocytic colitis: follows with GI. Last colonoscopy 12/2017 with single polyp.   Last pap 02/2016 normal, negative HPV repeat 02/2021  Has been getting some heartburn but tums helps with this just intermittently.   Mammogram completed 07/2019.  Normal.  Multivitamin: Fish oil: 1200mg  B12: 1066mcg Melatonin at bedtime: seems to help her sleep. 10mg  Biotin: 5036mcg  Past Medical History:  Diagnosis Date  . Abnormal exercise tolerance test 11/24/2012   Did not reach 85% maximum heart rate. No ischemic changes noted at 79%. Notably beta blocker was not held; no arrhythmias noted.  Marland Kitchen Episcleritis of right eye   . H/O echocardiogram OS 20/40   EF 60-65% with mild concentric LVH. Normal wall motion. Grade 1 diastolic dysfunction. Trace aortic regurgitation. No clear-cut evidence of murmur source  . Hot flashes   . Hypertension   . Lymphocytic colitis   . PAF (paroxysmal atrial fibrillation) [facility]) July 2014  . Paroxysmal atrial flutter [facility]) August 2014  . Paroxysmal SVT (supraventricular  tachycardia) ([place]) 10/06/2012   This may have simply been misdiagnosed A. fib  . Scleritis and episcleritis of right eye 08/28/2012   Past Surgical History:  Procedure Laterality Date  . CESAREAN SECTION    . [place]  . WRIST SURGERY Left    No Known Allergies Current Meds  Medication Sig  . CALCIUM PO Take 1 tablet by mouth daily.  . cholecalciferol (VITAMIN D) 400 units TABS tablet Take 400 Units by mouth daily.  Marland Kitchen diltiazem (CARDIZEM CD) 120 MG 24 hr capsule TAKE 1 CAPSULE (120 MG TOTAL) BY MOUTH EVERY EVENING.  . flecainide (TAMBOCOR) 100 MG tablet TAKE 1 TABLET BY MOUTH TWICE A DAY  . FYAVOLV 1-5 MG-MCG TABS tablet TAKE 1 TABLET BY MOUTH EVERY DAY  . Glucosamine 500 MG CAPS Take 1 capsule (500 mg total) by mouth every morning.  . irbesartan (AVAPRO) 75 MG tablet TAKE 1 TABLET BY MOUTH EVERY DAY  . Melatonin 10 MG CAPS Take 1 capsule by mouth daily.   Social History   Tobacco Use  . Smoking status: Never Smoker  . Smokeless tobacco: Never Used  Substance Use Topics  . Alcohol use: Yes    Comment: 2 glasses wine daily   Family History  Problem Relation Age of Onset  . Diabetes Mother   . Hypertension Mother   . Stroke Father   . Hypertension Father   . Heart attack Father 50  . Cancer Sister 29       breast  . Breast cancer Sister   . Ulcerative colitis  Son   . Healthy Maternal Grandmother   . Alcohol abuse Maternal Grandfather   . Colon cancer Neg Hx   . Esophageal cancer Neg Hx   . Rectal cancer Neg Hx   . Stomach cancer Neg Hx      Review of Systems  Constitutional: Negative for activity change, appetite change, chills, fatigue, fever and unexpected weight change.  HENT: Negative for congestion, ear pain, hearing loss, sinus pressure, sinus pain, sore throat and trouble swallowing.   Eyes: Negative for pain and visual disturbance.  Respiratory: Negative for cough, chest tightness, shortness of breath and wheezing.   Cardiovascular:  Negative for chest pain, palpitations and leg swelling.  Gastrointestinal: Negative for abdominal pain, blood in stool, constipation, diarrhea, nausea and vomiting.  Genitourinary: Negative for difficulty urinating and menstrual problem.  Musculoskeletal: Negative for arthralgias and back pain.  Skin: Negative for rash.  Neurological: Negative for dizziness, weakness, numbness and headaches.  Hematological: Negative for adenopathy. Does not bruise/bleed easily.  Psychiatric/Behavioral: Negative for sleep disturbance and suicidal ideas. The patient is not nervous/anxious.     CBC:  Lab Results  Component Value Date   WBC 10.5 01/04/2016   HGB 15.2 (H) 01/04/2016   HCT 43.0 01/04/2016   MCH 33.6 01/04/2016   MCHC 35.3 01/04/2016   RDW 11.8 01/04/2016   PLT 253 01/04/2016   MPV 8.7 11/19/2014   CMP: Lab Results  Component Value Date   NA 132 (L) 03/28/2016   K 4.7 03/28/2016   CL 95 (L) 03/28/2016   CO2 28 03/28/2016   ANIONGAP 13 01/04/2016   GLUCOSE 102 (H) 03/28/2016   BUN 12 03/28/2016   CREATININE 0.84 03/28/2016   GFRAA >60 01/04/2016   CALCIUM 9.4 03/28/2016   LIPID: Lab Results  Component Value Date   CHOL 202 (H) 02/09/2016   TRIG 72.0 05/07/2014   HDL 74.10 02/09/2016   LDLCALC 102 (H) 05/07/2014    Objective:  BP 108/64 (BP Location: Left Arm, Patient Position: Sitting, Cuff Size: Normal)   Pulse (!) 58   Temp 98.2 F (36.8 C) (Oral)   Ht 5' 2.25" (1.581 m)   Wt 169 lb 3.2 oz (76.7 kg)   BMI 30.70 kg/m   Weight: 169 lb 3.2 oz (76.7 kg)   BP Readings from Last 3 Encounters:  09/16/19 108/64  06/29/19 120/74  05/28/19 (!) 150/90   Wt Readings from Last 3 Encounters:  09/16/19 169 lb 3.2 oz (76.7 kg)  06/29/19 177 lb (80.3 kg)  05/28/19 178 lb 14.4 oz (81.1 kg)    Physical Exam Constitutional:      General: She is not in acute distress.    Appearance: She is well-developed.  HENT:     Head: Normocephalic and atraumatic.     Comments:  Cerumen impaction bilaterally, this was completely resolved after below procedure.    Right Ear: Tympanic membrane and external ear normal.     Left Ear: Tympanic membrane and external ear normal.     Mouth/Throat:     Pharynx: No oropharyngeal exudate.  Eyes:     Conjunctiva/sclera: Conjunctivae normal.     Pupils: Pupils are equal, round, and reactive to light.  Neck:     Thyroid: No thyromegaly.  Cardiovascular:     Rate and Rhythm: Normal rate and regular rhythm.     Heart sounds: Normal heart sounds. No murmur heard.  No friction rub. No gallop.   Pulmonary:     Effort: Pulmonary effort is normal.  Breath sounds: Normal breath sounds.  Abdominal:     General: Bowel sounds are normal. There is no distension.     Palpations: Abdomen is soft. There is no mass.     Tenderness: There is no abdominal tenderness. There is no guarding.     Hernia: No hernia is present.  Musculoskeletal:        General: No tenderness or deformity. Normal range of motion.     Cervical back: Normal range of motion and neck supple.  Lymphadenopathy:     Cervical: No cervical adenopathy.  Skin:    General: Skin is warm and dry.     Findings: No rash.  Neurological:     Mental Status: She is alert and oriented to person, place, and time.     Deep Tendon Reflexes: Reflexes normal.     Reflex Scores:      Tricep reflexes are 2+ on the right side and 2+ on the left side.      Bicep reflexes are 2+ on the right side and 2+ on the left side.      Brachioradialis reflexes are 2+ on the right side and 2+ on the left side.      Patellar reflexes are 2+ on the right side and 2+ on the left side. Psychiatric:        Speech: Speech normal.        Behavior: Behavior normal.        Thought Content: Thought content normal.   Cerumen impaction/cerumen removal: After consent was obtained to relieve cerumen impaction, irrigation, lighted curette, otoscope was used to remove bilateral impactions.  Patient  tolerated procedure well and impactions were removed completely.   Assessment/Plan: There are no preventive care reminders to display for this patient. Health Maintenance reviewed.  1. Preventative health care Keep up with regular exercise.  2. PAF (paroxysmal atrial fibrillation) ([place]); CHA2DS2Vasc =1 (not on AC) Rate has been stable.  She is following with cardiology.  She has establish care with a new cardiologist next week.  I have encouraged her to check her blood pressures until that time, since she has had some lower pressures recently as well as episodes of symptomatic hypotension.  3. Essential hypertension See above.  She has been working on weight loss and I feel the medications will be able to be adjusted.  I encouraged her to check her blood pressure so we have more information about how they are running at home.  She is not orthostatic here today in the office, but is borderline.  Encouraged her to maintain hydration.  - CBC with Differential/Platelet; Future - Comprehensive metabolic panel; Future - Comprehensive metabolic panel - CBC with Differential/Platelet  4. Hot flashes We will check lab work.  She is willing to cut down on hormonal replacement and we will start with gradual decline to alternating: Half tablets.  She will let me know how she does with this and we can decrease dose for next prescription. - TSH; Future - TSH  5. Frequent urination Notes that this started after blood pressure medications.  We may make adjustments pending her home recording of blood pressures, but we will check a urine in the meanwhile. - Urinalysis; Future - Urinalysis  6. B12 deficiency - Vitamin B12; Future - Vitamin B12  7. Lipid screening - Lipid panel; Future - Lipid panel  8. Screening for diabetes mellitus - Hemoglobin A1c; Future - Hemoglobin A1c  9. Bilateral impacted cerumen Resolved in office today. - Ear  Lavage  Return in about 6 months (around 03/18/2020)  for bloodwork when able (order from october 2020), Chronic condition visit then in 6 months.  Micheline Rough, MD

## 2019-09-17 LAB — COMPREHENSIVE METABOLIC PANEL
AG Ratio: 1.5 (calc) (ref 1.0–2.5)
ALT: 27 U/L (ref 6–29)
AST: 32 U/L (ref 10–35)
Albumin: 4.6 g/dL (ref 3.6–5.1)
Alkaline phosphatase (APISO): 81 U/L (ref 37–153)
BUN: 12 mg/dL (ref 7–25)
CO2: 26 mmol/L (ref 20–32)
Calcium: 9.9 mg/dL (ref 8.6–10.4)
Chloride: 97 mmol/L — ABNORMAL LOW (ref 98–110)
Creat: 0.82 mg/dL (ref 0.50–0.99)
Globulin: 3 g/dL (calc) (ref 1.9–3.7)
Glucose, Bld: 93 mg/dL (ref 65–99)
Potassium: 4.8 mmol/L (ref 3.5–5.3)
Sodium: 133 mmol/L — ABNORMAL LOW (ref 135–146)
Total Bilirubin: 0.8 mg/dL (ref 0.2–1.2)
Total Protein: 7.6 g/dL (ref 6.1–8.1)

## 2019-09-17 LAB — LIPID PANEL
Cholesterol: 182 mg/dL (ref <200)
HDL: 75 mg/dL (ref >=50)
LDL Cholesterol (Calc): 88 mg/dL (calc)
Non-HDL Cholesterol (Calc): 107 mg/dL (calc) (ref <130)
Total CHOL/HDL Ratio: 2.4 (calc) (ref <5.0)
Triglycerides: 95 mg/dL (ref <150)

## 2019-09-17 LAB — CBC WITH DIFFERENTIAL/PLATELET
Absolute Monocytes: 710 cells/uL (ref 200–950)
Basophils Absolute: 89 cells/uL (ref 0–200)
Basophils Relative: 1.2 %
Eosinophils Absolute: 252 cells/uL (ref 15–500)
Eosinophils Relative: 3.4 %
HCT: 41.7 % (ref 35.0–45.0)
Hemoglobin: 14.1 g/dL (ref 11.7–15.5)
Lymphs Abs: 1214 cells/uL (ref 850–3900)
MCH: 35.5 pg — ABNORMAL HIGH (ref 27.0–33.0)
MCHC: 33.8 g/dL (ref 32.0–36.0)
MCV: 105 fL — ABNORMAL HIGH (ref 80.0–100.0)
MPV: 9.5 fL (ref 7.5–12.5)
Monocytes Relative: 9.6 %
Neutro Abs: 5136 cells/uL (ref 1500–7800)
Neutrophils Relative %: 69.4 %
Platelets: 293 10*3/uL (ref 140–400)
RBC: 3.97 10*6/uL (ref 3.80–5.10)
RDW: 11.4 % (ref 11.0–15.0)
Total Lymphocyte: 16.4 %
WBC: 7.4 10*3/uL (ref 3.8–10.8)

## 2019-09-17 LAB — TSH: TSH: 1.57 mIU/L (ref 0.40–4.50)

## 2019-09-17 LAB — VITAMIN B12: Vitamin B-12: 686 pg/mL (ref 200–1100)

## 2019-09-17 LAB — URINALYSIS
Bilirubin Urine: NEGATIVE
Glucose, UA: NEGATIVE
Hgb urine dipstick: NEGATIVE
Nitrite: NEGATIVE
Specific Gravity, Urine: 1.014 (ref 1.001–1.03)
pH: 6 (ref 5.0–8.0)

## 2019-09-17 LAB — HEMOGLOBIN A1C
Hgb A1c MFr Bld: 5.1 % of total Hgb (ref <5.7)
Mean Plasma Glucose: 100 (calc)
eAG (mmol/L): 5.5 (calc)

## 2019-09-18 ENCOUNTER — Other Ambulatory Visit: Payer: Self-pay | Admitting: *Deleted

## 2019-09-18 DIAGNOSIS — E871 Hypo-osmolality and hyponatremia: Secondary | ICD-10-CM

## 2019-09-21 NOTE — Progress Notes (Signed)
Cardiology Office Note:    Date:  09/21/2019   ID:  Veronica Valencia, DOB [birth year], MRN [id number]  PCP:  Caren Macadam, MD  Cardiologist:  No primary care provider on file.   Referring MD: Caren Macadam, MD   No chief complaint on file.   History of Present Illness:    Veronica Valencia is a 64 y.o. female with a hx of PSVT and PAF switching over from Dr. Ellyn Hack.  There is switching over from Dr. Ellyn Hack.  2014/15 timeframe she had tachycardia and came away with the diagnosis of PSVT/atrial flutter/atrial fibrillation.  Ultimately started on flecainide.  Cannot tolerate beta-blocker therapy because of bradycardia.  Ultimately had diltiazem CD added.  No significant recurrences of arrhythmia since that time.  Never had ablation or cardioversion.  CHA2DS2-VASc was less than 2 and therefore anticoagulation was not started.  She is quite physically active and has no limitations.  She has lost greater than 30 pounds and has had some dizziness on her current medical regimen.  One of the questions from her primary physician was related to whether adjustments in medications to cause hypotension could be made.  Drinks 2 glasses of wine each night.  Past Medical History:  Diagnosis Date  . Abnormal exercise tolerance test 11/24/2012   Did not reach 85% maximum heart rate. No ischemic changes noted at 79%. Notably beta blocker was not held; no arrhythmias noted.  Marland Kitchen Episcleritis of right eye   . H/O echocardiogram OS 20/40   EF 60-65% with mild concentric LVH. Normal wall motion. Grade 1 diastolic dysfunction. Trace aortic regurgitation. No clear-cut evidence of murmur source  . Hot flashes   . Hypertension   . Lymphocytic colitis   . PAF (paroxysmal atrial fibrillation) [facility]) July 2014  . Paroxysmal atrial flutter [facility]) August 2014  . Paroxysmal SVT (supraventricular tachycardia) ([place]) 10/06/2012   This may have simply been misdiagnosed A. fib  . Scleritis and episcleritis of right  eye 08/28/2012    Past Surgical History:  Procedure Laterality Date  . CESAREAN SECTION    . [place]  . WRIST SURGERY Left     Current Medications: No outpatient medications have been marked as taking for the 09/22/19 encounter (Appointment) with Belva Crome, MD.     Allergies:   Patient has no known allergies.   Social History   Socioeconomic History  . Marital status: Married    Spouse name: Not on file  . Number of children: Not on file  . Years of education: Not on file  . Highest education level: Professional school degree (e.g., MD, DDS, DVM, JD)  Occupational History  . Occupation: Conservation officer, historic buildings    Comment: [employer]  Tobacco Use  . Smoking status: Never Smoker  . Smokeless tobacco: Never Used  Vaping Use  . Vaping Use: Never used  Substance and Sexual Activity  . Alcohol use: Yes    Comment: 2 glasses wine daily  . Drug use: No  . Sexual activity: Not on file  Other Topics Concern  . 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  Social Determinants of Health   Financial Resource Strain:   . Difficulty of Paying Living Expenses:   Food Insecurity:   . Worried About Charity fundraiser in the Last Year:   . Arboriculturist in the Last Year:   Transportation Needs:   . Film/video editor (Medical):   Marland Kitchen Lack of Transportation (Non-Medical):   Physical Activity:   . Days of Exercise per Week:   . Minutes of Exercise per Session:   Stress:   . Feeling of Stress :   Social Connections:   . Frequency of Communication with Friends and Family:   . Frequency of Social Gatherings with Friends  and Family:   . Attends Religious Services:   . Active Member of Clubs or Organizations:   . Attends Archivist Meetings:   Marland Kitchen Marital Status:      Family History: The patient's family history includes Alcohol abuse in her maternal grandfather; Breast cancer in her sister; Cancer (age of onset: 65) in her sister; Diabetes in her mother; Healthy in her maternal grandmother; Heart attack (age of onset: 51) in her father; Hypertension in her father and mother; Stroke in her father; Ulcerative colitis in her son. There is no history of Colon cancer, Esophageal cancer, Rectal cancer, or Stomach cancer.  ROS:   Please see the history of present illness.    No neurological complaints.  No specific medication side effects.  All other systems reviewed and are negative.  EKGs/Labs/Other Studies Reviewed:    The following studies were reviewed today: Continuous cardiac monitor 2017: Study Highlights    NSR with rare Sinus Bradycardia & Tachycardia  Very rare PVCs  NO Atrial Fibrillation, Flutter or SVT   Echocardiogram 2014 Study Conclusions   - Left ventricle: The cavity size was normal. There was mild  concentric hypertrophy. Systolic function was normal. The  estimated ejection fraction was in the range of 60% to  65%. Wall motion was normal; there were no regional wall  motion abnormalities. Doppler parameters are consistent  with abnormal left ventricular relaxation (grade 1  diastolic dysfunction). The E/e' ratio is >10, suggesting  elevated LV filling pressure.  - Aortic valve: Trileaflet. Trace to mild regurgitation.  - Left atrium: LA Volume/BSA 33.1 ml/m2. The atrium was  mildly dilated.  - Inferior vena cava: The vessel was normal in size; the  respirophasic diameter changes were in the normal range (=  50%); findings are consistent with normal central venous  pressure.   EKG:  EKG performed July 01, 2019 reveals left atrial abnormality,  incomplete right bundle, leftward axis.  Recent Labs: 09/16/2019: ALT 27; BUN 12; Creat 0.82; Hemoglobin 14.1; Platelets 293; Potassium 4.8; Sodium 133; TSH 1.57  Recent Lipid Panel    Component Value Date/Time   CHOL 182 09/16/2019 1126   TRIG 95 09/16/2019 1126   HDL 75 09/16/2019 1126   CHOLHDL 2.4 09/16/2019 1126   VLDL 14.4 05/07/2014 1016   LDLCALC 88 09/16/2019 1126    Physical Exam:    VS:  There were no vitals taken for this visit.    Wt Readings from Last 3 Encounters:  09/16/19 169 lb 3.2 oz (76.7 kg)  06/29/19 177 lb (80.3 kg)  05/28/19 178 lb 14.4 oz (81.1 kg)     GEN: Moderate obesity. No acute distress HEENT: Normal NECK: No JVD. LYMPHATICS: No lymphadenopathy CARDIAC:  RRR without murmur, gallop, or edema. VASCULAR:  Normal Pulses. No bruits. RESPIRATORY:  Clear to  auscultation without rales, wheezing or rhonchi  ABDOMEN: Soft, non-tender, non-distended, No pulsatile mass, MUSCULOSKELETAL: No deformity  SKIN: Warm and dry NEUROLOGIC:  Alert and oriented x 3 PSYCHIATRIC:  Normal affect   ASSESSMENT:    1. Paroxysmal SVT (supraventricular tachycardia) ([place])   2. PAF (paroxysmal atrial fibrillation) ([place]); CHA2DS2Vasc =1 (not on AC)   3. Chronotropic incompetence with beta blockers   4. Essential hypertension   5. Aortic valve sclerosis   6. Educated about COVID-19 virus infection    PLAN:    In order of problems listed above:  1. Currently suppressed with diltiazem 2. Currently suppressed with flecainide 100 mg twice daily 3. Not currently on beta-blocker therapy.   4. Recent low blood pressure recordings at home although here in the office today check-in blood pressure was 142/74.  Standing blood pressure 115/70 mmHg.  She reported blood pressures that she requires at home and they run between 79/60 mmHg to 117/65 mmHg with almost all recordings less than 151 mmHg systolic.  We will discontinue irbesartan.  Continue to monitor pressures at home at  least 2 hours after a.m. diltiazem.  Target blood pressure 130/80 mmHg or less. 5. No murmurs heard on exam 6. Has been vaccinated.  Follow-up as needed or in 1 year.   Medication Adjustments/Labs and Tests Ordered: Current medicines are reviewed at length with the patient today.  Concerns regarding medicines are outlined above.  No orders of the defined types were placed in this encounter.  No orders of the defined types were placed in this encounter.   There are no Patient Instructions on file for this visit.   Signed, Sinclair Grooms, MD  09/21/2019 1:05 PM    [place]

## 2019-09-21 NOTE — Addendum Note (Signed)
Addended by: Marrion Coy on: 09/21/2019 04:15 PM   Modules accepted: Orders

## 2019-09-22 ENCOUNTER — Other Ambulatory Visit: Payer: Self-pay

## 2019-09-22 ENCOUNTER — Encounter: Payer: Self-pay | Admitting: Interventional Cardiology

## 2019-09-22 ENCOUNTER — Ambulatory Visit (INDEPENDENT_AMBULATORY_CARE_PROVIDER_SITE_OTHER): Payer: BC Managed Care – PPO | Admitting: Interventional Cardiology

## 2019-09-22 VITALS — BP 142/74 | HR 59 | Ht 62.25 in | Wt 170.0 lb

## 2019-09-22 DIAGNOSIS — I10 Essential (primary) hypertension: Secondary | ICD-10-CM | POA: Diagnosis not present

## 2019-09-22 DIAGNOSIS — I4589 Other specified conduction disorders: Secondary | ICD-10-CM

## 2019-09-22 DIAGNOSIS — Z7189 Other specified counseling: Secondary | ICD-10-CM

## 2019-09-22 DIAGNOSIS — I480 Paroxysmal atrial fibrillation: Secondary | ICD-10-CM | POA: Diagnosis not present

## 2019-09-22 DIAGNOSIS — I358 Other nonrheumatic aortic valve disorders: Secondary | ICD-10-CM

## 2019-09-22 DIAGNOSIS — I471 Supraventricular tachycardia: Secondary | ICD-10-CM

## 2019-09-22 NOTE — Patient Instructions (Signed)
Medication Instructions:  1. Discontinue Irbesartan. Monitor your blood pressure at least 2-3 hours after medication and at least one hour after exercise.  *If you need a refill on your cardiac medications before your next appointment, please call your pharmacy*   Lab Work: None If you have labs (blood work) drawn today and your tests are completely normal, you will receive your results only by: Marland Kitchen MyChart Message (if you have MyChart) OR . A paper copy in the mail If you have any lab test that is abnormal or we need to change your treatment, we will call you to review the results.   Testing/Procedures: None   Follow-Up: At [facility], you and your health needs are our priority.  As part of our continuing mission to provide you with exceptional heart care, we have created designated Provider Care Teams.  These Care Teams include your primary Cardiologist (physician) and Advanced Practice Providers (APPs -  Physician Assistants and Nurse Practitioners) who all work together to provide you with the care you need, when you need it.  We recommend signing up for the patient portal called "MyChart".  Sign up information is provided on this After Visit Summary.  MyChart is used to connect with patients for Virtual Visits (Telemedicine).  Patients are able to view lab/test results, encounter notes, upcoming appointments, etc.  Non-urgent messages can be sent to your provider as well.   To learn more about what you can do with MyChart, go to [email or web address].    Your next appointment:   12 month(s)  The format for your next appointment:   In Person  Provider:   You may see Dr Daneen Schick or one of the following Advanced Practice Providers on your designated Care Team:    Truitt Merle, NP  Cecilie Kicks, NP  Kathyrn Drown, NP

## 2019-09-23 NOTE — Progress Notes (Signed)
Noted  

## 2019-09-27 ENCOUNTER — Other Ambulatory Visit: Payer: Self-pay | Admitting: Family Medicine

## 2019-09-27 DIAGNOSIS — I10 Essential (primary) hypertension: Secondary | ICD-10-CM

## 2019-10-13 DIAGNOSIS — H16142 Punctate keratitis, left eye: Secondary | ICD-10-CM | POA: Diagnosis not present

## 2019-10-15 DIAGNOSIS — H0100A Unspecified blepharitis right eye, upper and lower eyelids: Secondary | ICD-10-CM | POA: Diagnosis not present

## 2019-10-16 ENCOUNTER — Other Ambulatory Visit: Payer: Self-pay

## 2019-10-16 ENCOUNTER — Other Ambulatory Visit: Payer: BC Managed Care – PPO

## 2019-10-16 DIAGNOSIS — E871 Hypo-osmolality and hyponatremia: Secondary | ICD-10-CM | POA: Diagnosis not present

## 2019-10-16 LAB — BASIC METABOLIC PANEL
BUN/Creatinine Ratio: 13 (calc) (ref 6–22)
BUN: 14 mg/dL (ref 7–25)
CO2: 28 mmol/L (ref 20–32)
Calcium: 9.7 mg/dL (ref 8.6–10.4)
Chloride: 99 mmol/L (ref 98–110)
Creat: 1.04 mg/dL — ABNORMAL HIGH (ref 0.50–0.99)
Glucose, Bld: 112 mg/dL — ABNORMAL HIGH (ref 65–99)
Potassium: 4.7 mmol/L (ref 3.5–5.3)
Sodium: 136 mmol/L (ref 135–146)

## 2019-10-27 ENCOUNTER — Other Ambulatory Visit: Payer: Self-pay | Admitting: Family Medicine

## 2020-01-12 DIAGNOSIS — I8312 Varicose veins of left lower extremity with inflammation: Secondary | ICD-10-CM | POA: Diagnosis not present

## 2020-01-12 DIAGNOSIS — M7981 Nontraumatic hematoma of soft tissue: Secondary | ICD-10-CM | POA: Diagnosis not present

## 2020-01-19 ENCOUNTER — Other Ambulatory Visit: Payer: Self-pay | Admitting: Family Medicine

## 2020-03-16 ENCOUNTER — Other Ambulatory Visit: Payer: Self-pay

## 2020-03-16 ENCOUNTER — Encounter: Payer: Self-pay | Admitting: Family Medicine

## 2020-03-16 ENCOUNTER — Ambulatory Visit (INDEPENDENT_AMBULATORY_CARE_PROVIDER_SITE_OTHER): Payer: Self-pay | Admitting: Family Medicine

## 2020-03-16 VITALS — BP 120/84 | HR 62 | Temp 98.1°F | Ht 62.25 in | Wt 161.8 lb

## 2020-03-16 DIAGNOSIS — E663 Overweight: Secondary | ICD-10-CM

## 2020-03-16 DIAGNOSIS — L987 Excessive and redundant skin and subcutaneous tissue: Secondary | ICD-10-CM

## 2020-03-16 DIAGNOSIS — R232 Flushing: Secondary | ICD-10-CM

## 2020-03-16 NOTE — Progress Notes (Signed)
Veronica Valencia DOB: May 21, 1955 Encounter date: 03/16/2020  This is a 65 y.o. female who presents with Chief Complaint  Patient presents with  . Follow-up    History of present illness:  *couple of new moles she wanted checked.  *went off hormone pills; having hot flashes worse - always if she has to go to the bathroom or any alcohol. Not disrupting sleep. Happy to be on less medication and feels that symptoms are something she can manage.  *also stopped bp medication. Was checking regularly but stopped. Was doing well at home. Stopped by cardiology.  *in morning when brushing teeth and clearing throat she throws up some of coffee. No other acid reflux/heartburn during day. If she doesn't clear throat she doesn't throw up. Not coughing during day. Just in morning has a little cough/throat clearing.  *has lost 30lbs since starting Noom last year. Thighs are sagging since losing weight. Wondering about thigh lift; she is self conscious about them.   She is walking 5-6 miles daily.   No Known Allergies Current Meds  Medication Sig  . Biotin 5000 MCG CAPS Take 1 capsule by mouth daily.  Marland Kitchen CALCIUM PO Take 1 tablet by mouth daily.  . cholecalciferol (VITAMIN D) 400 units TABS tablet Take 400 Units by mouth daily.  . FYAVOLV 1-5 MG-MCG TABS tablet TAKE 1 TABLET BY MOUTH EVERY DAY  . Multiple Vitamin (MULTIVITAMIN) tablet Take 1 tablet by mouth daily.  . Omega 3 1000 MG CAPS Take 1 capsule by mouth daily.  . [DISCONTINUED] diltiazem (CARDIZEM CD) 120 MG 24 hr capsule TAKE 1 CAPSULE (120 MG TOTAL) BY MOUTH EVERY EVENING.  . [DISCONTINUED] flecainide (TAMBOCOR) 100 MG tablet TAKE 1 TABLET BY MOUTH TWICE A DAY    Review of Systems  Constitutional: Negative for chills, fatigue and fever.  Respiratory: Negative for cough, chest tightness, shortness of breath and wheezing.   Cardiovascular: Negative for chest pain, palpitations and leg swelling.  Skin:       Mole left inguinal area; noted  recently. Not aggravating/bothersome. Just new.  Psychiatric/Behavioral: Negative for sleep disturbance. The patient is not nervous/anxious.     Objective:  BP 120/84 (BP Location: Left Arm, Patient Position: Sitting, Cuff Size: Normal)   Pulse 62   Temp 98.1 F (36.7 C) (Oral)   Ht 5' 2.25" (1.581 m)   Wt 161 lb 12.8 oz (73.4 kg)   BMI 29.36 kg/m   Weight: 161 lb 12.8 oz (73.4 kg)   BP Readings from Last 3 Encounters:  03/16/20 120/84  09/22/19 (!) 142/74  09/16/19 108/64   Wt Readings from Last 3 Encounters:  03/16/20 161 lb 12.8 oz (73.4 kg)  09/22/19 170 lb (77.1 kg)  09/16/19 169 lb 3.2 oz (76.7 kg)    Physical Exam Constitutional:      General: She is not in acute distress.    Appearance: She is well-developed.  Cardiovascular:     Rate and Rhythm: Normal rate and regular rhythm.     Heart sounds: Normal heart sounds. No murmur heard. No friction rub.  Pulmonary:     Effort: Pulmonary effort is normal. No respiratory distress.     Breath sounds: Normal breath sounds. No wheezing or rales.  Musculoskeletal:     Right lower leg: No edema.     Left lower leg: No edema.  Skin:    Comments: Rough, brown papule left inguinal area consistent with seborrheic keratosis.  Neurological:     Mental Status: She is alert  and oriented to person, place, and time.  Psychiatric:        Behavior: Behavior normal.     Assessment/Plan  1. Hot flashes We discussed limiting alcohol, spicy, hot foods/drinks to help with these. We discussed that there are other options for treatment including non-hormonal, but she is comfortable monitoring for now and will let me know if she feels that she gets to point where sx are worrisome. I did print out patient up to date handout that reviews some other lifestyle/herbal remedies and well as brief review of med options to help with hot flashes.  2. Overweight She has been doing great with Noom and has been losing weight. She plans to continue  with this. She is exercising regularly as well.  3. Excess skin of thigh I have reached out to Dr. Marla Roe to get more information about procedure/possibly referral. I will be back in touch with Veronica Valencia when I get this information.  Return in about 6 months (around 09/13/2020) for physical exam.    Micheline Rough, MD

## 2020-03-24 ENCOUNTER — Telehealth: Payer: Self-pay | Admitting: *Deleted

## 2020-03-24 NOTE — Telephone Encounter (Signed)
-----   Message from Junell C Koberlein, MD sent at 03/20/2020  5:04 PM EST ----- Regarding: referral Please let patient know that Dr. Dillingham whom I contacted does do thigh lifts and is happy to see Ms. Dettmer. Ok to put in referral for plastics, Dr. Dillingham, if she would like. Junell Koberlein, MD   ----- Message ----- From: Dillingham, Claire S, DO Sent: 03/18/2020   7:19 AM EST To: Junell C Koberlein, MD  Yes we do this procedure and happy to see the patient.  Thank you ----- Message ----- From: Koberlein, Junell C, MD Sent: 03/16/2020  11:10 AM EST To: Claire S Dillingham, DO  i have a patient here who has lost weight and is interested in thigh lift. is this something you do/know someone who does these?  thanks!   

## 2020-03-24 NOTE — Telephone Encounter (Signed)
Left a message for the pt to return my call.  

## 2020-03-25 ENCOUNTER — Telehealth: Payer: Self-pay | Admitting: *Deleted

## 2020-03-25 NOTE — Telephone Encounter (Signed)
Patient called back and was informed of the message below.  Patient agreed to the referral and is aware someone will call with appt info.

## 2020-03-25 NOTE — Addendum Note (Signed)
Addended by: Agnes Lawrence on: 03/25/2020 04:09 PM   Modules accepted: Orders

## 2020-03-25 NOTE — Telephone Encounter (Signed)
-----   Message from Caren Macadam, MD sent at 03/20/2020  5:04 PM EST ----- Regarding: referral Please let patient know that Dr. Marla Roe whom I contacted does do thigh lifts and is happy to see Ms. Winsett. Ok to put in referral for plastics, Dr. Marla Roe, if she would like. Micheline Rough, MD   ----- Message ----- From: Wallace Going, DO Sent: 03/18/2020   7:19 AM EST To: Caren Macadam, MD  Yes we do this procedure and happy to see the patient.  Thank you ----- Message ----- From: Caren Macadam, MD Sent: 03/16/2020  11:10 AM EST To: Loel Lofty Dillingham, DO  i have a patient here who has lost weight and is interested in thigh lift. is this something you do/know someone who does these?  thanks!

## 2020-04-18 DIAGNOSIS — Z20822 Contact with and (suspected) exposure to covid-19: Secondary | ICD-10-CM | POA: Diagnosis not present

## 2020-04-20 ENCOUNTER — Ambulatory Visit: Payer: Self-pay | Admitting: Family Medicine

## 2020-05-12 DIAGNOSIS — Z20822 Contact with and (suspected) exposure to covid-19: Secondary | ICD-10-CM | POA: Diagnosis not present

## 2020-05-17 ENCOUNTER — Other Ambulatory Visit: Payer: Self-pay | Admitting: Cardiology

## 2020-05-17 NOTE — Telephone Encounter (Signed)
Reviewed patient chart. Patient changed cardiologist to Dr Tamala Julian. Last appointment 09/22/19. With Dr Tamala Julian.  Medication was discontinue at patient visit with primary on 03/16/20.

## 2020-05-31 ENCOUNTER — Encounter: Payer: Self-pay | Admitting: Plastic Surgery

## 2020-05-31 ENCOUNTER — Ambulatory Visit (INDEPENDENT_AMBULATORY_CARE_PROVIDER_SITE_OTHER): Payer: Self-pay | Admitting: Plastic Surgery

## 2020-05-31 ENCOUNTER — Other Ambulatory Visit: Payer: Self-pay

## 2020-05-31 VITALS — BP 127/77 | HR 70 | Ht 62.0 in | Wt 163.2 lb

## 2020-05-31 DIAGNOSIS — I4892 Unspecified atrial flutter: Secondary | ICD-10-CM

## 2020-05-31 DIAGNOSIS — I484 Atypical atrial flutter: Secondary | ICD-10-CM

## 2020-05-31 DIAGNOSIS — Z719 Counseling, unspecified: Secondary | ICD-10-CM | POA: Insufficient documentation

## 2020-05-31 DIAGNOSIS — I471 Supraventricular tachycardia: Secondary | ICD-10-CM

## 2020-05-31 DIAGNOSIS — I358 Other nonrheumatic aortic valve disorders: Secondary | ICD-10-CM

## 2020-05-31 NOTE — Progress Notes (Signed)
Patient ID: Veronica Valencia, female    DOB: 06-Sep-1955, 65 y.o.   MRN: [id number]   Chief Complaint  Patient presents with  . consult    The patient is a 65 year old female here for evaluation of some of her area.  She is 5 feet 2 inches tall weighs 163 pounds.  She does not like the excess of her medial thighs and upper arms.  Is a history of atrophic atrial flutter and SVTs.  She is not on any anticoagulation.  She says her current medication regimen controls it well.  She does not have diabetes and she is not a smoker.  She is interested in a brachioplasty and thigh lift on exam she does have some excess fat and loose skin but it is not bad especially considering her overall habitus and age.  She looks very good.  She is a Fitzpatrick 3 and hands well.  She scars with hypopigmentation.  This might make the scar more noticeable.  She also had some vein stripping done in her legs and the scars are noticeable with hypopigmentation and the actual scar.   Review of Systems  Constitutional: Negative.   HENT: Negative.   Respiratory: Negative.   Cardiovascular: Negative.   Gastrointestinal: Negative.   Genitourinary: Negative.   Musculoskeletal: Negative.   Neurological: Negative.   Hematological: Negative.     Past Medical History:  Diagnosis Date  . Abnormal exercise tolerance test 11/24/2012   Did not reach 85% maximum heart rate. No ischemic changes noted at 79%. Notably beta blocker was not held; no arrhythmias noted.  Marland Kitchen Episcleritis of right eye   . H/O echocardiogram OS 20/40   EF 60-65% with mild concentric LVH. Normal wall motion. Grade 1 diastolic dysfunction. Trace aortic regurgitation. No clear-cut evidence of murmur source  . Hot flashes   . Hypertension   . Lymphocytic colitis   . PAF (paroxysmal atrial fibrillation) [facility]) July 2014  . Paroxysmal atrial flutter [facility]) August 2014  . Paroxysmal SVT (supraventricular tachycardia) ([place]) 10/06/2012   This may have simply been  misdiagnosed A. fib  . Scleritis and episcleritis of right eye 08/28/2012    Past Surgical History:  Procedure Laterality Date  . CESAREAN SECTION    . [place]  . WRIST SURGERY Left       Current Outpatient Medications:  .  Biotin 5000 MCG CAPS, Take 1 capsule by mouth daily., Disp: , Rfl:  .  CALCIUM PO, Take 1 tablet by mouth daily., Disp: , Rfl:  .  cholecalciferol (VITAMIN D) 400 units TABS tablet, Take 400 Units by mouth daily., Disp: , Rfl:  .  DILTIAZEM HCL PO, Take by mouth., Disp: , Rfl:  .  flecainide (TAMBOCOR) 100 MG tablet, Take 100 mg by mouth 2 (two) times daily., Disp: , Rfl:  .  Multiple Vitamin (MULTIVITAMIN) tablet, Take 1 tablet by mouth daily., Disp: , Rfl:  .  Omega 3 1000 MG CAPS, Take 1 capsule by mouth daily., Disp: , Rfl:    Objective:   Vitals:   05/31/20 1411  BP: 127/77  Pulse: 70  SpO2: 99%    Physical Exam Vitals and nursing note reviewed.  Constitutional:      Appearance: Normal appearance.  HENT:     Head: Normocephalic and atraumatic.  Cardiovascular:     Rate and Rhythm: Normal rate.     Pulses: Normal pulses.  Pulmonary:     Effort: Pulmonary effort is normal.  No respiratory distress.  Abdominal:     General: Abdomen is flat. There is no distension.  Skin:    General: Skin is warm.     Capillary Refill: Capillary refill takes less than 2 seconds.  Neurological:     General: No focal deficit present.     Mental Status: She is alert and oriented to person, place, and time.  Psychiatric:        Mood and Affect: Mood normal.        Behavior: Behavior normal.     Assessment & Plan:  Aortic valve sclerosis  Encounter for counseling  Paroxysmal SVT (supraventricular tachycardia) ([facility])  Atypical atrial flutter ([facility])  Atrial flutter with rapid ventricular response ([place])  I do not think excision with a brachioplasty or thigh lift is a great idea for the patient based on her heart and the scars.  She still  wants to do something about it is not would like more information about liposuction.  This is an option.  We would have to make sure she could be done at [facility].  We can provide her with a quote.  Pictures were obtained of the patient and placed in the chart with the patient's or guardian's permission.   [place], DO

## 2020-06-16 ENCOUNTER — Other Ambulatory Visit: Payer: Self-pay | Admitting: Cardiology

## 2020-06-16 ENCOUNTER — Telehealth: Payer: Self-pay | Admitting: Interventional Cardiology

## 2020-06-16 MED ORDER — DILTIAZEM HCL ER COATED BEADS 120 MG PO CP24: 120.0000 mg | ORAL_CAPSULE | Freq: Every day | ORAL | 1 refills | Status: DC

## 2020-06-16 NOTE — Telephone Encounter (Signed)
*  STAT* If patient is at the pharmacy, call can be transferred to refill team.   1. Which medications need to be refilled? (please list name of each medication and dose if known) DILTIAZEM HCL PO  2. Which pharmacy/location (including street and city if local pharmacy) is medication to be sent to? [id number] - , [place] [address]  3. Do they need a 30 day or 90 day supply? 90 day supply  Pt has an upcoming appt on 11/03/20 @2pm  with Dr. Tamala Julian

## 2020-06-16 NOTE — Telephone Encounter (Signed)
Pt is requesting a refill on diltiazem. This medication does not have an strength. Would Dr. Tamala Julian like to reorder this medication? Please address

## 2020-06-16 NOTE — Telephone Encounter (Signed)
Spoke with pt to clarify that she is still taking Diltiazem 120mg  once daily.  Pt states this is correct.  Will send over prescription.

## 2020-07-12 ENCOUNTER — Ambulatory Visit (INDEPENDENT_AMBULATORY_CARE_PROVIDER_SITE_OTHER): Payer: BC Managed Care – PPO | Admitting: Family Medicine

## 2020-07-12 ENCOUNTER — Encounter: Payer: Self-pay | Admitting: Family Medicine

## 2020-07-12 ENCOUNTER — Other Ambulatory Visit: Payer: Self-pay

## 2020-07-12 VITALS — BP 136/70 | HR 65 | Temp 97.8°F | Wt 159.6 lb

## 2020-07-12 DIAGNOSIS — R42 Dizziness and giddiness: Secondary | ICD-10-CM | POA: Diagnosis not present

## 2020-07-12 MED ORDER — MECLIZINE HCL 25 MG PO TABS: 25.0000 mg | ORAL_TABLET | ORAL | 0 refills | Status: DC | PRN

## 2020-07-12 NOTE — Progress Notes (Signed)
   Subjective:    Patient ID: Veronica Valencia, female    DOB: 1956/02/11, 65 y.o.   MRN: [id number]  HPI Here with her husband for a bout of dizziness that started about 3 hours ago. She was in her garden planting flowers, and she noticed that whenever she bent forward or stooped over she felt very dizzy. It seemed like the world was spinning around her. She felt nasueated and she did vomit once. She has been dizzy off and on since then, but not to that extent. No headache or sinus congestion. No other neurologic deficits. She has had very mild dizzy spells over the past year, but they have never been as bad as today.    Review of Systems  Constitutional: Negative.   HENT: Negative.   Eyes: Negative.   Respiratory: Negative.   Cardiovascular: Negative.   Neurological: Positive for dizziness. Negative for tremors, seizures, syncope, facial asymmetry, speech difficulty, weakness, light-headedness, numbness and headaches.       Objective:   Physical Exam Constitutional:      General: She is not in acute distress.    Appearance: Normal appearance.  HENT:     Head: Normocephalic and atraumatic.     Right Ear: Tympanic membrane, ear canal and external ear normal.     Left Ear: Tympanic membrane, ear canal and external ear normal.     Nose: Nose normal.     Mouth/Throat:     Pharynx: Oropharynx is clear.  Eyes:     Conjunctiva/sclera: Conjunctivae normal.     Comments: No nystagmus   Cardiovascular:     Rate and Rhythm: Normal rate and regular rhythm.     Pulses: Normal pulses.     Heart sounds: Normal heart sounds.  Pulmonary:     Effort: Pulmonary effort is normal.     Breath sounds: Normal breath sounds.  Lymphadenopathy:     Cervical: No cervical adenopathy.  Neurological:     General: No focal deficit present.     Mental Status: She is alert and oriented to person, place, and time.           Assessment & Plan:  Vertigo, she can take Meclizine as needed. She will rest  today and drink plenty of fluids. Follow up as needed.  Alysia Penna, MD

## 2020-07-21 ENCOUNTER — Other Ambulatory Visit: Payer: Self-pay | Admitting: Family Medicine

## 2020-07-21 DIAGNOSIS — Z1231 Encounter for screening mammogram for malignant neoplasm of breast: Secondary | ICD-10-CM

## 2020-09-20 ENCOUNTER — Ambulatory Visit
Admission: RE | Admit: 2020-09-20 | Discharge: 2020-09-20 | Disposition: A | Discharge status: Home / Self Care | Payer: BC Managed Care – PPO | Source: Ambulatory Visit | Attending: Family Medicine | Admitting: Family Medicine

## 2020-09-20 ENCOUNTER — Other Ambulatory Visit: Payer: Self-pay

## 2020-09-20 DIAGNOSIS — Z1231 Encounter for screening mammogram for malignant neoplasm of breast: Secondary | ICD-10-CM

## 2020-09-21 ENCOUNTER — Ambulatory Visit (INDEPENDENT_AMBULATORY_CARE_PROVIDER_SITE_OTHER): Payer: BC Managed Care – PPO | Admitting: Family Medicine

## 2020-09-21 ENCOUNTER — Encounter: Payer: Self-pay | Admitting: Family Medicine

## 2020-09-21 ENCOUNTER — Other Ambulatory Visit (HOSPITAL_COMMUNITY)
Admission: RE | Admit: 2020-09-21 | Discharge: 2020-09-21 | Disposition: A | Discharge status: Home / Self Care | Payer: BC Managed Care – PPO | Source: Ambulatory Visit | Attending: Family Medicine | Admitting: Family Medicine

## 2020-09-21 VITALS — BP 132/80 | HR 64 | Temp 97.9°F | Ht 61.75 in | Wt 158.8 lb

## 2020-09-21 DIAGNOSIS — Z124 Encounter for screening for malignant neoplasm of cervix: Secondary | ICD-10-CM | POA: Diagnosis not present

## 2020-09-21 DIAGNOSIS — I471 Supraventricular tachycardia: Secondary | ICD-10-CM

## 2020-09-21 DIAGNOSIS — R200 Anesthesia of skin: Secondary | ICD-10-CM | POA: Diagnosis not present

## 2020-09-21 DIAGNOSIS — R0981 Nasal congestion: Secondary | ICD-10-CM

## 2020-09-21 DIAGNOSIS — Z23 Encounter for immunization: Secondary | ICD-10-CM | POA: Diagnosis not present

## 2020-09-21 DIAGNOSIS — I10 Essential (primary) hypertension: Secondary | ICD-10-CM

## 2020-09-21 DIAGNOSIS — Z0000 Encounter for general adult medical examination without abnormal findings: Secondary | ICD-10-CM | POA: Diagnosis not present

## 2020-09-21 DIAGNOSIS — Z13220 Encounter for screening for lipoid disorders: Secondary | ICD-10-CM | POA: Diagnosis not present

## 2020-09-21 LAB — LIPID PANEL
Cholesterol: 212 mg/dL — ABNORMAL HIGH (ref 0–200)
HDL: 104.5 mg/dL (ref >39.00)
LDL Cholesterol: 81 mg/dL (ref 0–99)
NonHDL: 107.64
Total CHOL/HDL Ratio: 2
Triglycerides: 135 mg/dL (ref 0.0–149.0)
VLDL: 27 mg/dL (ref 0.0–40.0)

## 2020-09-21 LAB — CBC WITH DIFFERENTIAL/PLATELET
Basophils Absolute: 0.1 10*3/uL (ref 0.0–0.1)
Basophils Relative: 1.3 % (ref 0.0–3.0)
Eosinophils Absolute: 0.3 10*3/uL (ref 0.0–0.7)
Eosinophils Relative: 4.4 % (ref 0.0–5.0)
HCT: 42.4 % (ref 36.0–46.0)
Hemoglobin: 14.7 g/dL (ref 12.0–15.0)
Lymphocytes Relative: 17.7 % (ref 12.0–46.0)
Lymphs Abs: 1.2 10*3/uL (ref 0.7–4.0)
MCHC: 34.7 g/dL (ref 30.0–36.0)
MCV: 101 fl — ABNORMAL HIGH (ref 78.0–100.0)
Monocytes Absolute: 0.9 10*3/uL (ref 0.1–1.0)
Monocytes Relative: 12.9 % — ABNORMAL HIGH (ref 3.0–12.0)
Neutro Abs: 4.2 10*3/uL (ref 1.4–7.7)
Neutrophils Relative %: 63.7 % (ref 43.0–77.0)
Platelets: 246 10*3/uL (ref 150.0–400.0)
RBC: 4.2 Mil/uL (ref 3.87–5.11)
RDW: 12 % (ref 11.5–15.5)
WBC: 6.6 10*3/uL (ref 4.0–10.5)

## 2020-09-21 LAB — COMPREHENSIVE METABOLIC PANEL
ALT: 43 U/L — ABNORMAL HIGH (ref 0–35)
AST: 68 U/L — ABNORMAL HIGH (ref 0–37)
Albumin: 4.5 g/dL (ref 3.5–5.2)
Alkaline Phosphatase: 126 U/L — ABNORMAL HIGH (ref 39–117)
BUN: 9 mg/dL (ref 6–23)
CO2: 27 mEq/L (ref 19–32)
Calcium: 10.3 mg/dL (ref 8.4–10.5)
Chloride: 98 mEq/L (ref 96–112)
Creatinine, Ser: 0.78 mg/dL (ref 0.40–1.20)
GFR: 80.73 mL/min (ref >60.00)
Glucose, Bld: 104 mg/dL — ABNORMAL HIGH (ref 70–99)
Potassium: 4.3 mEq/L (ref 3.5–5.1)
Sodium: 138 mEq/L (ref 135–145)
Total Bilirubin: 0.7 mg/dL (ref 0.2–1.2)
Total Protein: 7.5 g/dL (ref 6.0–8.3)

## 2020-09-21 LAB — VITAMIN B12: Vitamin B-12: 269 pg/mL (ref 211–911)

## 2020-09-21 LAB — TSH: TSH: 1.49 u[IU]/mL (ref 0.35–5.50)

## 2020-09-21 NOTE — Progress Notes (Signed)
Veronica Valencia DOB: 1956/01/28 Encounter date: 09/21/2020  This is a 65 y.o. female who presents for complete physical    History of present illness/Additional concerns: At last visit was working on healthy eating, regular exercise.  We discussed hot flashes; worse since she weaned self off hormone replacement. Slightly stable with weight now.   Had mammogram yesterday; results are pending.  Last colonoscopy 12/2017; repeat suggested in 12/2027  Follows with cardiology. Hasn't been checking bp at home.  Last pap was normal 02/2016  In her semi-retirement she is sleeping 9 hours a night. She does feel rested in the morning.   Little numbness and swelling in left pinkybetween digits. Not sure what happened; no discolored, no bites or sores. Has been there for at least a couple of months. No pain; more numb. Just sore to touch. No arm pain on left or neck pain.   Getting congested a few times through night; wakes up; blows nose. Sometimes clears up.   Has some cough in the morning - phlegm. Seems new for her. Not really coughing during day. Once she gets it all cleared out then she is good through the day. Not noting acid reflux. No shortness of breath or wheezing.   She did meet with plastic surgeon after last visit and she had recommended lipsuction. She is still unhappy with cosmetic appearance of arms, legs after some weight loss and would like to discuss other options/second opinion.  Past Medical History:  Diagnosis Date   Abnormal exercise tolerance test 11/24/2012   Did not reach 85% maximum heart rate. No ischemic changes noted at 79%. Notably beta blocker was not held; no arrhythmias noted.   Episcleritis of right eye    H/O echocardiogram OS 20/40   EF 60-65% with mild concentric LVH. Normal wall motion. Grade 1 diastolic dysfunction. Trace aortic regurgitation. No clear-cut evidence of murmur source   Hot flashes    Hypertension    Lymphocytic colitis    PAF (paroxysmal  atrial fibrillation) [facility]) July 2014   Paroxysmal atrial flutter [facility]) August 2014   Paroxysmal SVT (supraventricular tachycardia) ([place]) 10/06/2012   This may have simply been misdiagnosed A. fib   Scleritis and episcleritis of right eye 08/28/2012   Past Surgical History:  Procedure Laterality Date   CESAREAN SECTION     VARICOSE VEIN SURGERY  1990   WRIST SURGERY Left    No Known Allergies Current Meds  Medication Sig   Biotin 5000 MCG CAPS Take 1 capsule by mouth daily.   diltiazem (CARDIZEM CD) 120 MG 24 hr capsule Take 1 capsule (120 mg total) by mouth daily.   flecainide (TAMBOCOR) 100 MG tablet Take 100 mg by mouth 2 (two) times daily.   Multiple Vitamin (MULTIVITAMIN) tablet Take 1 tablet by mouth daily.   Omega 3 1000 MG CAPS Take 1 capsule by mouth daily.   Social History   Tobacco Use   Smoking status: Never   Smokeless tobacco: Never  Substance Use Topics   Alcohol use: Yes    Comment: 2 glasses wine daily   Family History  Problem Relation Age of Onset   Diabetes Mother    Hypertension Mother    Stroke Father    Hypertension Father    Heart attack Father 63   Cancer Sister 28       breast   Breast cancer Sister    Ulcerative colitis Son    Healthy Maternal Grandmother    Alcohol abuse Maternal Grandfather  Colon cancer Neg Hx    Esophageal cancer Neg Hx    Rectal cancer Neg Hx    Stomach cancer Neg Hx      Review of Systems  Constitutional:  Negative for activity change, appetite change, chills, fatigue, fever and unexpected weight change.  HENT:  Negative for congestion, ear pain, hearing loss, sinus pressure, sinus pain, sore throat and trouble swallowing.   Eyes:  Negative for pain and visual disturbance.  Respiratory:  Positive for cough (notes more in morning). Negative for chest tightness, shortness of breath and wheezing.   Cardiovascular:  Negative for chest pain, palpitations and leg swelling.  Gastrointestinal:  Negative for abdominal  pain, blood in stool, constipation, diarrhea, nausea and vomiting.  Genitourinary:  Negative for difficulty urinating and menstrual problem.  Musculoskeletal:  Negative for arthralgias and back pain.  Skin:  Negative for rash.  Neurological:  Negative for dizziness, weakness, numbness and headaches.  Hematological:  Negative for adenopathy. Does not bruise/bleed easily.  Psychiatric/Behavioral:  Negative for sleep disturbance and suicidal ideas. The patient is not nervous/anxious.    CBC:  Lab Results  Component Value Date   WBC 6.6 09/21/2020   HGB 14.7 09/21/2020   HCT 42.4 09/21/2020   MCH 35.5 (H) 09/16/2019   MCHC 34.7 09/21/2020   RDW 12.0 09/21/2020   PLT 246.0 09/21/2020   MPV 9.5 09/16/2019   CMP: Lab Results  Component Value Date   NA 138 09/21/2020   K 4.3 09/21/2020   CL 98 09/21/2020   CO2 27 09/21/2020   ANIONGAP 13 01/04/2016   GLUCOSE 104 (H) 09/21/2020   BUN 9 09/21/2020   CREATININE 0.78 09/21/2020   CREATININE 1.04 (H) 10/16/2019   GFRAA >60 01/04/2016   CALCIUM 10.3 09/21/2020   PROT 7.5 09/21/2020   BILITOT 0.7 09/21/2020   ALKPHOS 126 (H) 09/21/2020   ALT 43 (H) 09/21/2020   AST 68 (H) 09/21/2020   LIPID: Lab Results  Component Value Date   CHOL 212 (H) 09/21/2020   TRIG 135.0 09/21/2020   HDL 104.50 09/21/2020   LDLCALC 81 09/21/2020   LDLCALC 88 09/16/2019    Objective:  BP 132/80 (BP Location: Left Arm, Patient Position: Sitting, Cuff Size: Normal)   Pulse 64   Temp 97.9 F (36.6 C) (Oral)   Ht 5' 1.75" (1.568 m)   Wt 158 lb 12.8 oz (72 kg)   SpO2 98%   BMI 29.28 kg/m   Weight: 158 lb 12.8 oz (72 kg)   BP Readings from Last 3 Encounters:  09/21/20 132/80  07/12/20 136/70  05/31/20 127/77   Wt Readings from Last 3 Encounters:  09/21/20 158 lb 12.8 oz (72 kg)  07/12/20 159 lb 9.6 oz (72.4 kg)  05/31/20 163 lb 3.2 oz (74 kg)    Physical Exam Constitutional:      General: She is not in acute distress.    Appearance: She  is well-developed.  HENT:     Head: Normocephalic and atraumatic.     Right Ear: External ear normal.     Left Ear: External ear normal.     Mouth/Throat:     Pharynx: No oropharyngeal exudate.  Eyes:     Conjunctiva/sclera: Conjunctivae normal.     Pupils: Pupils are equal, round, and reactive to light.  Neck:     Thyroid: No thyromegaly.  Cardiovascular:     Rate and Rhythm: Normal rate and regular rhythm.     Heart sounds: Normal heart sounds. No  murmur heard.   No friction rub. No gallop.  Pulmonary:     Effort: Pulmonary effort is normal.     Breath sounds: Normal breath sounds.  Abdominal:     General: Bowel sounds are normal. There is no distension.     Palpations: Abdomen is soft. There is no mass.     Tenderness: There is no abdominal tenderness. There is no guarding.     Hernia: No hernia is present.  Genitourinary:    General: Normal vulva.     Exam position: Supine.     Adnexa:        Right: No tenderness or fullness.         Left: No tenderness or fullness.    Musculoskeletal:        General: No tenderness or deformity. Normal range of motion.     Cervical back: Normal range of motion and neck supple.  Lymphadenopathy:     Cervical: No cervical adenopathy.  Skin:    General: Skin is warm and dry.     Findings: No rash.  Neurological:     Mental Status: She is alert and oriented to person, place, and time.     Deep Tendon Reflexes: Reflexes normal.     Reflex Scores:      Tricep reflexes are 2+ on the right side and 2+ on the left side.      Bicep reflexes are 2+ on the right side and 2+ on the left side.      Brachioradialis reflexes are 2+ on the right side and 2+ on the left side.      Patellar reflexes are 2+ on the right side and 2+ on the left side. Psychiatric:        Speech: Speech normal.        Behavior: Behavior normal.        Thought Content: Thought content normal.    Assessment/Plan: Health Maintenance Due  Topic Date Due   Pneumococcal  Vaccine 25-77 Years old (1 - PCV) Never done   COVID-19 Vaccine (5 - Booster) 04/29/2020   Health Maintenance reviewed  1. Preventative health care Keep working on regular activity and healthy eating.  2. Essential hypertension Has been well controlled. Continue with diltiazem 120mg  daily - CBC with Differential/Platelet; Future - Comprehensive metabolic panel; Future - Comprehensive metabolic panel - CBC with Differential/Platelet  3. Paroxysmal SVT (supraventricular tachycardia) (HCC) Rate controlled. Continue with flecainide  4. Numbness of finger Hx of significant trauma to left hand/arm with severe nerve and tendon injury. No noted weakness or pain in finger. Will check bloodwork and make  sure nothing abnormal that would affect nerve health; but suspect this is related to trauma hx. - TSH; Future - Vitamin B12; Future - Vitamin B12 - TSH  5. Nasal congestion Discussed using nasal saline rinses - neil med sinus or neti pot daily; 1+ hours prior to bed to see if this helps. Would like to hold off on nasal spray or antihistamine if possible since those would become chronic meds for her.   6. Lipid screening - Lipid panel; Future - Lipid panel  7. Need for shingles vaccine - Varicella-zoster vaccine IM (Shingrix)  8. Cervical cancer screening - PAP []   Return in about 6 months (around 03/24/2021) for Chronic condition visit.  Micheline Rough, MD

## 2020-09-21 NOTE — Patient Instructions (Signed)
Oculofacial plastics: Karn Cassis [phone]

## 2020-09-22 ENCOUNTER — Other Ambulatory Visit: Payer: Self-pay | Admitting: Family Medicine

## 2020-09-22 DIAGNOSIS — R928 Other abnormal and inconclusive findings on diagnostic imaging of breast: Secondary | ICD-10-CM

## 2020-09-22 LAB — CYTOLOGY - PAP
Comment: NEGATIVE
Diagnosis: NEGATIVE
High risk HPV: NEGATIVE

## 2020-09-23 ENCOUNTER — Other Ambulatory Visit: Payer: Self-pay | Admitting: Family Medicine

## 2020-09-23 DIAGNOSIS — R748 Abnormal levels of other serum enzymes: Secondary | ICD-10-CM

## 2020-09-26 ENCOUNTER — Telehealth: Payer: Self-pay | Admitting: *Deleted

## 2020-09-26 NOTE — Telephone Encounter (Signed)
-----   Message from Caren Macadam, MD sent at 09/26/2020  2:17 PM EDT ----- Patient was asking about second opinion for plastic surgeon. Dr. Miguel Aschoff was recommended to me; she can look on website:  [email or web address] and he has information all about the procedures she is interested in. He is out of [place].  ----- Message ----- From: Caren Macadam, MD Sent: 09/25/2020  12:29 PM EDT To: Caren Macadam, MD  Plastic surg recommendations

## 2020-09-26 NOTE — Telephone Encounter (Signed)
Patient informed of the message below.

## 2020-10-01 ENCOUNTER — Other Ambulatory Visit: Payer: Self-pay | Admitting: Cardiology

## 2020-10-03 ENCOUNTER — Other Ambulatory Visit: Payer: Self-pay | Admitting: Family Medicine

## 2020-10-03 ENCOUNTER — Other Ambulatory Visit: Payer: Self-pay | Admitting: Interventional Cardiology

## 2020-10-03 ENCOUNTER — Other Ambulatory Visit: Payer: Self-pay | Admitting: Cardiology

## 2020-10-03 HISTORY — PX: BREAST BIOPSY: SHX20

## 2020-10-04 ENCOUNTER — Other Ambulatory Visit: Payer: Self-pay | Admitting: Family Medicine

## 2020-10-11 ENCOUNTER — Other Ambulatory Visit: Payer: Self-pay | Admitting: Family Medicine

## 2020-10-11 ENCOUNTER — Ambulatory Visit
Admission: RE | Admit: 2020-10-11 | Discharge: 2020-10-11 | Disposition: A | Discharge status: Home / Self Care | Payer: BC Managed Care – PPO | Source: Ambulatory Visit | Attending: Family Medicine | Admitting: Family Medicine

## 2020-10-11 ENCOUNTER — Other Ambulatory Visit: Payer: Self-pay

## 2020-10-11 DIAGNOSIS — R928 Other abnormal and inconclusive findings on diagnostic imaging of breast: Secondary | ICD-10-CM | POA: Diagnosis not present

## 2020-10-11 DIAGNOSIS — R921 Mammographic calcification found on diagnostic imaging of breast: Secondary | ICD-10-CM

## 2020-10-11 DIAGNOSIS — R922 Inconclusive mammogram: Secondary | ICD-10-CM | POA: Diagnosis not present

## 2020-10-13 ENCOUNTER — Ambulatory Visit
Admission: RE | Admit: 2020-10-13 | Discharge: 2020-10-13 | Disposition: A | Discharge status: Home / Self Care | Payer: BC Managed Care – PPO | Source: Ambulatory Visit | Attending: Family Medicine | Admitting: Family Medicine

## 2020-10-13 ENCOUNTER — Other Ambulatory Visit: Payer: Self-pay

## 2020-10-13 DIAGNOSIS — R921 Mammographic calcification found on diagnostic imaging of breast: Secondary | ICD-10-CM | POA: Diagnosis not present

## 2020-10-13 DIAGNOSIS — N6012 Diffuse cystic mastopathy of left breast: Secondary | ICD-10-CM | POA: Diagnosis not present

## 2020-11-02 NOTE — Progress Notes (Signed)
Cardiology Office Note:    Date:  11/03/2020   ID:  Veronica Valencia, DOB November 29, 1955, MRN [id number]  PCP:  Caren Macadam, MD  Cardiologist:  Sinclair Grooms, MD   Referring MD: Caren Macadam, MD   Chief Complaint  Patient presents with   Follow-up    History of Present Illness:    Jocell Gerhold is a 65 y.o. female with a hx of PSVT and PAF (without documented tracings; CHADS VASC < 2) on flecainide, primary hypertension, diastolic dysfunction, and family h/o CAD (father), DM and Htn.   Asymptomatic.  Walks 5 miles daily.  No dyspnea or chest discomfort.  Sleeps well.  No intervening episodes of SVT or atrial fibrillation since last being seen a year ago.  Past Medical History:  Diagnosis Date   Abnormal exercise tolerance test 11/24/2012   Did not reach 85% maximum heart rate. No ischemic changes noted at 79%. Notably beta blocker was not held; no arrhythmias noted.   Episcleritis of right eye    H/O echocardiogram OS 20/40   EF 60-65% with mild concentric LVH. Normal wall motion. Grade 1 diastolic dysfunction. Trace aortic regurgitation. No clear-cut evidence of murmur source   Hot flashes    Hypertension    Lymphocytic colitis    [facility] ([facility]oxysmal atrial fibrillation) [facility]) July 2014   Paroxysmal atrial flutter [facility]) August 2014   Paroxysmal SVT (supraventricular tachycardia) ([place]) 10/06/2012   This may have simply been misdiagnosed A. fib   Scleritis and episcleritis of right eye 08/28/2012    Past Surgical History:  Procedure Laterality Date   CESAREAN SECTION     VARICOSE VEIN SURGERY  1990   WRIST SURGERY Left     Current Medications: Current Meds  Medication Sig   Biotin 5000 MCG CAPS Take 1 capsule by mouth daily.   diltiazem (CARDIZEM CD) 120 MG 24 hr capsule TAKE 1 CAPSULE BY MOUTH EVERY DAY   flecainide (TAMBOCOR) 100 MG tablet TAKE 1 TABLET BY MOUTH TWICE A DAY   Multiple Vitamin (MULTIVITAMIN) tablet Take 1 tablet by mouth daily.    Omega 3 1000 MG CAPS Take 1 capsule by mouth daily.     Allergies:   Patient has no known allergies.   Social History   Socioeconomic History   Marital status: Married    Spouse name: Not on file   Number of children: Not on file   Years of education: Not on file   Highest education level: Professional school degree (e.g., MD, DDS, DVM, JD)  Occupational History   Occupation: Associate Theme park manager    Comment: [place], [place]  Tobacco Use   Smoking status: Never   Smokeless tobacco: Never  Vaping Use   Vaping Use: Never used  Substance and Sexual Activity   Alcohol use: Yes    Comment: 2 glasses wine daily   Drug use: No   Sexual activity: Not on file  Other Topics Concern   Not on file  Social History Narrative   She is a married mother of 3.    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   Social Determinants of Health   Financial Resource Strain: Not on file  Food Insecurity: Not on file  Transportation Needs: Not on file  Physical Activity: Not on file  Stress: Not on file  Social Connections: Not on file     Family History: The patient's family history includes Alcohol abuse in her maternal grandfather; Breast cancer in her sister; Cancer (age of onset: 65) in her sister; Diabetes in her mother; Healthy in her maternal grandmother; Heart attack (age of onset: 27) in her father; Hypertension in her father and mother; Stroke in her father; Ulcerative colitis in her son. There is no history of Colon cancer, Esophageal cancer, Rectal cancer, or Stomach cancer.  ROS:   Please see the history of present illness.    Never smoked.  Denies orthopnea.  All other systems reviewed and are negative.  EKGs/Labs/Other  Studies Reviewed:    The following studies were reviewed today: Laboratory data from July 2022: LDL 81, total cholesterol 212, HDL 104, TG 135  MONITOR 2017: Study Highlights    NSR with rare Sinus Bradycardia & Tachycardia Very rare PVCs NO Atrial Fibrillation, Flutter or SVT  EKG:  EKG sinus rhythm, atrial abnormality, left axis deviation/left anterior hemiblock.  Recent Labs: 09/21/2020: ALT 43; BUN 9; Creatinine, Ser 0.78; Hemoglobin 14.7; Platelets 246.0; Potassium 4.3; Sodium 138; TSH 1.49  Recent Lipid Panel    Component Value Date/Time   CHOL 212 (H) 09/21/2020 1133   TRIG 135.0 09/21/2020 1133   HDL 104.50 09/21/2020 1133   CHOLHDL 2 09/21/2020 1133   VLDL 27.0 09/21/2020 1133   LDLCALC 81 09/21/2020 1133   LDLCALC 88 09/16/2019 1126    Physical Exam:    VS:  BP 122/80   Pulse 60   Ht 5' 1.75" (1.568 m)   Wt 159 lb 6.4 oz (72.3 kg)   SpO2 98%   BMI 29.39 kg/m     Wt Readings from Last 3 Encounters:  11/03/20 159 lb 6.4 oz (72.3 kg)  09/21/20 158 lb 12.8 oz (72 kg)  07/12/20 159 lb 9.6 oz (72.4 kg)     GEN: Healthy appearing. No acute distress HEENT: Normal NECK: No JVD. LYMPHATICS: No lymphadenopathy CARDIAC: 2/6 systolic murmur. RRR no gallop, or edema. VASCULAR:  Normal Pulses. No bruits. RESPIRATORY:  Clear to auscultation without rales, wheezing or rhonchi  ABDOMEN: Soft, non-tender, non-distended, No pulsatile mass, MUSCULOSKELETAL: No deformity  SKIN: Warm and dry NEUROLOGIC:  Alert and oriented x 3 PSYCHIATRIC:  Normal affect   ASSESSMENT:    1. Paroxysmal SVT (supraventricular tachycardia) ([place])   2. PAF (paroxysmal atrial fibrillation) ([place]); CHA2DS2Vasc =1 (not on AC)   3. Chronotropic incompetence with beta blockers   4. Essential hypertension   5. Aortic valve sclerosis   6. Hyperlipidemia, unspecified hyperlipidemia type    PLAN:    In order of problems listed above:  Controlled on antiarrhythmic therapy. Controlled on  antiarrhythmic therapy Beta-blocker therapy has been discontinued. Excellent blood pressure control. Stable exam Lipid panel is favorable relative to protection.  We discussed the coronary calcium score.  In absence of any CV symptoms and with other risk factors being perfect, will not do any further testing or address lipid profile at this time.      Medication Adjustments/Labs and Tests Ordered: Current medicines are reviewed at length with the patient today.  Concerns regarding medicines are outlined above.  Orders Placed This Encounter  Procedures   EKG 12-Lead   No orders of the defined types were placed in this encounter.   Patient Instructions  Medication Instructions:  Your physician recommends that you  continue on your current medications as directed. Please refer to the Current Medication list given to you today.  *If you need a refill on your cardiac medications before your next appointment, please call your pharmacy*   Lab Work: None If you have labs (blood work) drawn today and your tests are completely normal, you will receive your results only by: [place] (if you have MyChart) OR A paper copy in the mail If you have any lab test that is abnormal or we need to change your treatment, we will call you to review the results.   Testing/Procedures: None   Follow-Up: At [facility], you and your health needs are our priority.  As part of our continuing mission to provide you with exceptional heart care, we have created designated Provider Care Teams.  These Care Teams include your primary Cardiologist (physician) and Advanced Practice Providers (APPs -  Physician Assistants and Nurse Practitioners) who all work together to provide you with the care you need, when you need it.  We recommend signing up for the patient portal called "MyChart".  Sign up information is provided on this After Visit Summary.  MyChart is used to connect with patients for Virtual  Visits (Telemedicine).  Patients are able to view lab/test results, encounter notes, upcoming appointments, etc.  Non-urgent messages can be sent to your provider as well.   To learn more about what you can do with MyChart, go to [email or web address].    Your next appointment:   1 year(s)  The format for your next appointment:   In Person  Provider:   You may see Sinclair Grooms, MD or one of the following Advanced Practice Providers on your designated Care Team:   Cecilie Kicks, NP   Other Instructions     Signed, Sinclair Grooms, MD  11/03/2020 3:03 PM    [place]

## 2020-11-03 ENCOUNTER — Other Ambulatory Visit: Payer: Self-pay

## 2020-11-03 ENCOUNTER — Encounter: Payer: Self-pay | Admitting: Interventional Cardiology

## 2020-11-03 ENCOUNTER — Ambulatory Visit (INDEPENDENT_AMBULATORY_CARE_PROVIDER_SITE_OTHER): Payer: BC Managed Care – PPO | Admitting: Interventional Cardiology

## 2020-11-03 VITALS — BP 122/80 | HR 60 | Ht 61.75 in | Wt 159.4 lb

## 2020-11-03 DIAGNOSIS — I10 Essential (primary) hypertension: Secondary | ICD-10-CM

## 2020-11-03 DIAGNOSIS — E785 Hyperlipidemia, unspecified: Secondary | ICD-10-CM

## 2020-11-03 DIAGNOSIS — I480 Paroxysmal atrial fibrillation: Secondary | ICD-10-CM

## 2020-11-03 DIAGNOSIS — I4589 Other specified conduction disorders: Secondary | ICD-10-CM

## 2020-11-03 DIAGNOSIS — I471 Supraventricular tachycardia: Secondary | ICD-10-CM

## 2020-11-03 DIAGNOSIS — I358 Other nonrheumatic aortic valve disorders: Secondary | ICD-10-CM

## 2020-11-03 NOTE — Patient Instructions (Signed)

## 2021-05-29 ENCOUNTER — Other Ambulatory Visit: Payer: Self-pay | Admitting: Interventional Cardiology

## 2021-06-30 ENCOUNTER — Telehealth (INDEPENDENT_AMBULATORY_CARE_PROVIDER_SITE_OTHER): Payer: BC Managed Care – PPO | Admitting: Family Medicine

## 2021-06-30 DIAGNOSIS — K52832 Lymphocytic colitis: Secondary | ICD-10-CM | POA: Diagnosis not present

## 2021-06-30 MED ORDER — BUDESONIDE 3 MG PO CPEP: 9.0000 mg | ORAL_CAPSULE | Freq: Every day | ORAL | 0 refills | Status: DC

## 2021-06-30 NOTE — Progress Notes (Signed)
Virtual Visit via Video Note ? ?I connected with Veronica Valencia on 06/30/21 at  3:30 PM EDT by a video enabled telemedicine application 2/2 OHYWV-37 pandemic and verified that I am speaking with the correct person using two identifiers. ? Location patient: home ?Location provider:work or home office ?Persons participating in the virtual visit: patient, provider ? ?I discussed the limitations of evaluation and management by telemedicine and the availability of in person appointments. The patient expressed understanding and agreed to proceed. ? ?Chief Complaint  ?Patient presents with  ? Diarrhea  ?  Loose stools x 2 today; 4x yesterday. Pt has not tried any OTC antidiarrhetics. Denies fever or symptoms of dehydration   ? ? ?HPI: ?Pt has a h/o lymphocytic colitis.  Per chart review seen by GI in 2019 during last flare.  Pt had 4 episodes of loose stools yesterday and 2 today.  Pt also had 2 episodes last wk.  Had fecal incontinence.  Denies abd pain, cramping,  hematochezia.  Pt endorses some increased stress planning church activities.  Wants to get symptoms under control as has a trip out of town next wk. ? ? ?ROS: See pertinent positives and negatives per HPI. ? ?Past Medical History:  ?Diagnosis Date  ? Abnormal exercise tolerance test 11/24/2012  ? Did not reach 85% maximum heart rate. No ischemic changes noted at 79%. Notably beta blocker was not held; no arrhythmias noted.  ? Episcleritis of right eye   ? H/O echocardiogram OS 20/40  ? EF 60-65% with mild concentric LVH. Normal wall motion. Grade 1 diastolic dysfunction. Trace aortic regurgitation. No clear-cut evidence of murmur source  ? Hot flashes   ? Hypertension   ? Lymphocytic colitis   ? PAF (paroxysmal atrial fibrillation) [facility]) July 2014  ? Paroxysmal atrial flutter [facility]) August 2014  ? Paroxysmal SVT (supraventricular tachycardia) ([place]) 10/06/2012  ? This may have simply been misdiagnosed A. fib  ? Scleritis and episcleritis of right eye 08/28/2012   ? ? ?Past Surgical History:  ?Procedure Laterality Date  ? CESAREAN SECTION    ? [place]  ? WRIST SURGERY Left   ? ? ?Family History  ?Problem Relation Age of Onset  ? Diabetes Mother   ? Hypertension Mother   ? Stroke Father   ? Hypertension Father   ? Heart attack Father 44  ? Cancer Sister 36  ?     breast  ? Breast cancer Sister   ? Ulcerative colitis Son   ? Healthy Maternal Grandmother   ? Alcohol abuse Maternal Grandfather   ? Colon cancer Neg Hx   ? Esophageal cancer Neg Hx   ? Rectal cancer Neg Hx   ? Stomach cancer Neg Hx   ? ? ? ?Current Outpatient Medications:  ?  Biotin 5000 MCG CAPS, Take 1 capsule by mouth daily., Disp: , Rfl:  ?  diltiazem (CARDIZEM CD) 120 MG 24 hr capsule, TAKE 1 CAPSULE BY MOUTH EVERY DAY, Disp: 90 capsule, Rfl: 1 ?  flecainide (TAMBOCOR) 100 MG tablet, TAKE 1 TABLET BY MOUTH TWICE A DAY, Disp: 180 tablet, Rfl: 3 ?  Multiple Vitamin (MULTIVITAMIN) tablet, Take 1 tablet by mouth daily., Disp: , Rfl:  ?  Omega 3 1000 MG CAPS, Take 1 capsule by mouth daily., Disp: , Rfl:  ? ?EXAM: ? ?VITALS per patient if applicable: RR between 10-62 bpm ? ?GENERAL: alert, oriented, appears well and in no acute distress ? ?HEENT: atraumatic, conjunctiva clear, no obvious abnormalities  on inspection of external nose and ears ? ?NECK: normal movements of the head and neck ? ?LUNGS: on inspection no signs of respiratory distress, breathing rate appears normal, no obvious gross SOB, gasping or wheezing ? ?CV: no obvious cyanosis ? ?MS: moves all visible extremities without noticeable abnormality ? ?PSYCH/NEURO: pleasant and cooperative, no obvious depression or anxiety, speech and thought processing grossly intact ? ?ASSESSMENT AND PLAN: ? ?Discussed the following assessment and plan: ? ?Lymphocytic colitis  ?-Per chart review last flare was in 2019. ?-Will start budesonide 9 mg daily ?-Given strict precautions due to limited evaluation ability via video. ?-discussed in person f/u  and f/u with GI for continued or worsened symptoms ?- Plan: budesonide (ENTOCORT EC) 3 MG 24 hr capsule ? ?Follow-up as needed ?  ?I discussed the assessment and treatment plan with the patient. The patient was provided an opportunity to ask questions and all were answered. The patient agreed with the plan and demonstrated an understanding of the instructions. ?  ?The patient was advised to call back or seek an in-person evaluation if the symptoms worsen or if the condition fails to improve as anticipated. ? ?Billie Ruddy, MD  ? ?

## 2021-07-06 ENCOUNTER — Encounter: Payer: Self-pay | Admitting: Family Medicine

## 2021-09-20 ENCOUNTER — Other Ambulatory Visit: Payer: Self-pay | Admitting: *Deleted

## 2021-09-20 MED ORDER — FLECAINIDE ACETATE 100 MG PO TABS: 100.0000 mg | ORAL_TABLET | Freq: Two times a day (BID) | ORAL | 0 refills | Status: DC

## 2021-10-20 ENCOUNTER — Other Ambulatory Visit: Payer: Self-pay | Admitting: Family

## 2021-10-26 ENCOUNTER — Other Ambulatory Visit: Payer: Self-pay | Admitting: Pediatric Radiology

## 2021-10-26 ENCOUNTER — Other Ambulatory Visit: Payer: Self-pay

## 2021-10-26 ENCOUNTER — Other Ambulatory Visit: Payer: Self-pay | Admitting: Family Medicine

## 2021-10-26 ENCOUNTER — Telehealth: Payer: Self-pay | Admitting: Family Medicine

## 2021-10-26 DIAGNOSIS — Z1231 Encounter for screening mammogram for malignant neoplasm of breast: Secondary | ICD-10-CM

## 2021-10-26 MED ORDER — FLECAINIDE ACETATE 100 MG PO TABS: 100.0000 mg | ORAL_TABLET | Freq: Two times a day (BID) | ORAL | 0 refills | Status: DC

## 2021-10-26 NOTE — Telephone Encounter (Signed)
Sure that's fine no problem

## 2021-10-26 NOTE — Telephone Encounter (Signed)
Pt's medication was sent to pt's pharmacy as requested. Confirmation received.  °

## 2021-10-26 NOTE — Telephone Encounter (Signed)
Pt does not want to do TOC and would like to sch a cpe with dr Legrand Como instead. Please advise

## 2021-10-31 ENCOUNTER — Ambulatory Visit
Admission: RE | Admit: 2021-10-31 | Discharge: 2021-10-31 | Disposition: A | Discharge status: Home / Self Care | Payer: BC Managed Care – PPO | Source: Ambulatory Visit | Attending: Family Medicine | Admitting: Family Medicine

## 2021-10-31 DIAGNOSIS — Z1231 Encounter for screening mammogram for malignant neoplasm of breast: Secondary | ICD-10-CM | POA: Diagnosis not present

## 2021-11-08 NOTE — Telephone Encounter (Signed)
Pt has been sch for cpe

## 2021-11-14 ENCOUNTER — Encounter: Payer: Self-pay | Admitting: Family Medicine

## 2021-11-14 ENCOUNTER — Ambulatory Visit (INDEPENDENT_AMBULATORY_CARE_PROVIDER_SITE_OTHER): Payer: BC Managed Care – PPO | Admitting: Family Medicine

## 2021-11-14 VITALS — BP 122/80 | HR 60 | Temp 97.7°F | Ht 61.25 in | Wt 148.1 lb

## 2021-11-14 DIAGNOSIS — R232 Flushing: Secondary | ICD-10-CM

## 2021-11-14 DIAGNOSIS — Z0000 Encounter for general adult medical examination without abnormal findings: Secondary | ICD-10-CM

## 2021-11-14 DIAGNOSIS — R7989 Other specified abnormal findings of blood chemistry: Secondary | ICD-10-CM | POA: Diagnosis not present

## 2021-11-14 DIAGNOSIS — I10 Essential (primary) hypertension: Secondary | ICD-10-CM

## 2021-11-14 NOTE — Patient Instructions (Addendum)
Check blood pressure daily, goal is anything less than 140/90, message me in about 1-2 weeks with your blood pressure readings over My Chart.

## 2021-11-14 NOTE — Progress Notes (Signed)
Established Patient Office Visit  Subjective   Patient ID: Veronica Valencia, female    DOB: Mar 17, 1955  Age: 66 y.o. MRN: [id number]  Chief Complaint  Patient presents with   Annual Exam    Patient is here for her annual exam and her transition of care visit.   History of HTN - pt reports her blood pressure at home has been up and down. States she checks it regularly, states she has had 2-3 readings at home that were elevated. She reports she was taken off her blood pressure medication in the past, no problems with chest pain or headaches, no SOB. States she has lost about 40 pounds over the last 2 years and thought that maybe she wouldn't need it.   Paroxysmal SVT- sees cardiology regularly, next appt is in December. We reviewed her medications, on diltiazem and flecanide. Reports no known side effects to these medications.   Hot flashes-- patient reports she continues to have hot flashes at random - states that she gets hot and nauseated in the morning mostly, no other associated symptoms, states she went through menopause more than 10 years ago.   History of falls-- pt states her first fall was in October 2022, July she fell again,  then fell again last week, states that 2 times she remembered the fall and 2 times she did not. States that she was concerned about her risk of falls.   HM reviewed, she is UTD on colonoscopy and mammogram and pap.   Current Outpatient Medications  Medication Instructions   Biotin 5000 MCG CAPS 1 capsule, Oral, Daily   diltiazem (CARDIZEM CD) 120 MG 24 hr capsule TAKE 1 CAPSULE BY MOUTH EVERY DAY   flecainide (TAMBOCOR) 100 mg, Oral, 2 times daily   Multiple Vitamin (MULTIVITAMIN) tablet 1 tablet, Oral, Daily   Omega 3 1000 MG CAPS 1 capsule, Oral, Daily     Patient Active Problem List   Diagnosis Date Noted   Encounter for counseling 05/31/2020   Essential hypertension 02/23/2016   Atypical atrial flutter ([place]) 01/17/2016   Chronotropic  incompetence with beta blockers 01/06/2014    Class: Question of   Paroxysmal SVT (supraventricular tachycardia) ([place]) 10/06/2012   Atrial flutter with rapid ventricular response ([place]) 10/06/2012    Class: Diagnosis of   Aortic valve sclerosis 10/06/2012   PAF (paroxysmal atrial fibrillation) ([place]); CHA2DS2Vasc =1 (not on [facility]) 09/19/2012   Hot flashes 08/28/2012   FH: breast cancer in first degree relative 08/28/2012      Review of Systems  Cardiovascular:  Negative for chest pain and palpitations.  Neurological:  Negative for dizziness, focal weakness and headaches.  All other systems reviewed and are negative.     Objective:     BP 122/80 (BP Location: Left Arm, Patient Position: Sitting, Cuff Size: Normal)   Pulse 60   Temp 97.7 F (36.5 C) (Oral)   Ht 5' 1.25" (1.556 m)   Wt 148 lb 1.6 oz (67.2 kg)   SpO2 99%   BMI 27.76 kg/m  BP Readings from Last 3 Encounters:  11/14/21 122/80  11/03/20 122/80  09/21/20 132/80      Physical Exam Vitals reviewed.  Constitutional:      Appearance: Normal appearance. She is well-groomed and normal weight.  HENT:     Head: Normocephalic and atraumatic.     Mouth/Throat:     Mouth: Mucous membranes are moist.     Pharynx: Oropharynx is clear.  Eyes:     Extraocular  Movements: Extraocular movements intact.     Conjunctiva/sclera: Conjunctivae normal.     Pupils: Pupils are equal, round, and reactive to light.  Cardiovascular:     Rate and Rhythm: Normal rate and regular rhythm.     Pulses: Normal pulses.     Heart sounds: S1 normal and S2 normal.  Pulmonary:     Effort: Pulmonary effort is normal.     Breath sounds: Normal breath sounds and air entry.  Abdominal:     General: Abdomen is flat. Bowel sounds are normal.     Palpations: Abdomen is soft.  Musculoskeletal:        General: Normal range of motion.     Cervical back: Normal range of motion and neck supple.     Right lower leg: No edema.     Left lower leg: No  edema.  Skin:    General: Skin is warm and dry.  Neurological:     Mental Status: She is alert and oriented to person, place, and time. Mental status is at baseline.     Gait: Gait is intact.  Psychiatric:        Mood and Affect: Mood and affect normal.        Speech: Speech normal.        Behavior: Behavior normal.        Judgment: Judgment normal.        The ASCVD Risk score (Arnett DK, et al., 2019) failed to calculate for the following reasons:   The valid HDL cholesterol range is 20 to 100 mg/dL    Assessment & Plan:   Problem List Items Addressed This Visit       Cardiovascular and Mediastinum   Hot flashes - Primary    Will check TSH to rule out hyperthyroidism, we discussed that HRT would not be recommended at this time due to age >61, pt states she will monitor her symptoms, we discussed other treatments as well but she wants to wait.       Relevant Orders   TSH (Completed)   Essential hypertension (Chronic)    Pt is off all BP medication, BP today in office is WNL, pt had lost weight previously and her BP appears to be normal. I reviewed her home BP's with her, I advised she continue to monitor her BP daily and if she is consistently getting numbers >140 then we may need to restart her medication. She is due for her annual bloodwork today.       Relevant Orders   CMP (Completed)   Lipid Panel (Completed)   CBC with Differential/Platelets (Completed)   Other Visit Diagnoses     Routine general medical examination at a health care facility           Return in about 1 year (around 11/15/2022) for Yearly follow up.    Farrel Conners, MD

## 2021-11-15 LAB — COMPREHENSIVE METABOLIC PANEL
ALT: 46 U/L — ABNORMAL HIGH (ref 0–35)
AST: 70 U/L — ABNORMAL HIGH (ref 0–37)
Albumin: 4.3 g/dL (ref 3.5–5.2)
Alkaline Phosphatase: 141 U/L — ABNORMAL HIGH (ref 39–117)
BUN: 9 mg/dL (ref 6–23)
CO2: 27 mEq/L (ref 19–32)
Calcium: 10.2 mg/dL (ref 8.4–10.5)
Chloride: 95 mEq/L — ABNORMAL LOW (ref 96–112)
Creatinine, Ser: 0.8 mg/dL (ref 0.40–1.20)
GFR: 77.69 mL/min (ref >60.00)
Glucose, Bld: 85 mg/dL (ref 70–99)
Potassium: 4.6 mEq/L (ref 3.5–5.1)
Sodium: 133 mEq/L — ABNORMAL LOW (ref 135–145)
Total Bilirubin: 0.8 mg/dL (ref 0.2–1.2)
Total Protein: 7.8 g/dL (ref 6.0–8.3)

## 2021-11-15 LAB — CBC WITH DIFFERENTIAL/PLATELET
Basophils Absolute: 0.2 10*3/uL — ABNORMAL HIGH (ref 0.0–0.1)
Basophils Relative: 1.6 % (ref 0.0–3.0)
Eosinophils Absolute: 0.3 10*3/uL (ref 0.0–0.7)
Eosinophils Relative: 3.1 % (ref 0.0–5.0)
HCT: 43.2 % (ref 36.0–46.0)
Hemoglobin: 14.8 g/dL (ref 12.0–15.0)
Lymphocytes Relative: 17.6 % (ref 12.0–46.0)
Lymphs Abs: 1.8 10*3/uL (ref 0.7–4.0)
MCHC: 34.2 g/dL (ref 30.0–36.0)
MCV: 100 fl (ref 78.0–100.0)
Monocytes Absolute: 1 10*3/uL (ref 0.1–1.0)
Monocytes Relative: 10.4 % (ref 3.0–12.0)
Neutro Abs: 6.8 10*3/uL (ref 1.4–7.7)
Neutrophils Relative %: 67.3 % (ref 43.0–77.0)
Platelets: 260 10*3/uL (ref 150.0–400.0)
RBC: 4.32 Mil/uL (ref 3.87–5.11)
RDW: 12.4 % (ref 11.5–15.5)
WBC: 10 10*3/uL (ref 4.0–10.5)

## 2021-11-15 LAB — LIPID PANEL
Cholesterol: 189 mg/dL (ref 0–200)
HDL: 108.4 mg/dL (ref >39.00)
LDL Cholesterol: 63 mg/dL (ref 0–99)
NonHDL: 80.64
Total CHOL/HDL Ratio: 2
Triglycerides: 86 mg/dL (ref 0.0–149.0)
VLDL: 17.2 mg/dL (ref 0.0–40.0)

## 2021-11-15 LAB — TSH: TSH: 0.98 u[IU]/mL (ref 0.35–5.50)

## 2021-11-17 NOTE — Assessment & Plan Note (Addendum)
Pt is off all BP medication, BP today in office is WNL, pt had lost weight previously and her BP appears to be normal. I reviewed her home BP's with her, I advised she continue to monitor her BP daily and if she is consistently getting numbers >140 then we may need to restart her medication. She is due for her annual bloodwork today.

## 2021-11-17 NOTE — Assessment & Plan Note (Signed)
Will check TSH to rule out hyperthyroidism, we discussed that HRT would not be recommended at this time due to age >29, pt states she will monitor her symptoms, we discussed other treatments as well but she wants to wait.

## 2021-11-25 ENCOUNTER — Other Ambulatory Visit: Payer: Self-pay | Admitting: Interventional Cardiology

## 2021-11-27 ENCOUNTER — Telehealth: Payer: Self-pay | Admitting: Family Medicine

## 2021-11-27 NOTE — Telephone Encounter (Signed)
Pt returning call regarding lab results

## 2021-11-27 NOTE — Telephone Encounter (Signed)
See results note. 

## 2021-11-30 NOTE — Addendum Note (Signed)
Addended by: Farrel Conners on: 11/30/2021 09:28 AM   Modules accepted: Orders

## 2021-12-10 DIAGNOSIS — N390 Urinary tract infection, site not specified: Secondary | ICD-10-CM | POA: Diagnosis not present

## 2021-12-10 DIAGNOSIS — R300 Dysuria: Secondary | ICD-10-CM | POA: Diagnosis not present

## 2021-12-26 ENCOUNTER — Other Ambulatory Visit: Payer: BC Managed Care – PPO

## 2021-12-27 ENCOUNTER — Ambulatory Visit
Admission: RE | Admit: 2021-12-27 | Discharge: 2021-12-27 | Disposition: A | Discharge status: Home / Self Care | Payer: BC Managed Care – PPO | Source: Ambulatory Visit | Attending: Family Medicine | Admitting: Family Medicine

## 2021-12-27 ENCOUNTER — Telehealth: Payer: Self-pay | Admitting: *Deleted

## 2021-12-27 DIAGNOSIS — R7989 Other specified abnormal findings of blood chemistry: Secondary | ICD-10-CM

## 2021-12-27 DIAGNOSIS — R945 Abnormal results of liver function studies: Secondary | ICD-10-CM | POA: Diagnosis not present

## 2021-12-27 NOTE — Telephone Encounter (Signed)
CALL REPORT: Opal Sidles called from [facility] (WE#[phone]) stating the US Abdomen reveals: Common bile duct abnormally dilated to 10.19m-recommendations are a MRCP or ERCP for further evaluation. There is sludge and stones in gallbladder, increased echogenecity throughout liver that is non-specific but may be seen with hepatic steotosis OR underlying hepatocellular disease.  Message sent to Dr BElease Hashimotoas PCP is out of the office.

## 2022-01-02 ENCOUNTER — Other Ambulatory Visit: Payer: Self-pay | Admitting: Family Medicine

## 2022-01-02 DIAGNOSIS — K838 Other specified diseases of biliary tract: Secondary | ICD-10-CM

## 2022-01-02 DIAGNOSIS — R7989 Other specified abnormal findings of blood chemistry: Secondary | ICD-10-CM

## 2022-01-05 ENCOUNTER — Encounter: Payer: Self-pay | Admitting: Gastroenterology

## 2022-01-19 ENCOUNTER — Other Ambulatory Visit: Payer: Self-pay | Admitting: Interventional Cardiology

## 2022-02-05 NOTE — Progress Notes (Unsigned)
Cardiology Office Note:    Date:  02/06/2022   ID:  Veronica Valencia, DOB [birth year], MRN [id number]  PCP:  Farrel Conners, MD  Cardiologist:  Sinclair Grooms, MD   Referring MD: No ref. provider found   Chief Complaint  Patient presents with   Atrial Fibrillation   Hypertension    History of Present Illness:    Veronica Valencia is a 66 y.o. female with a hx of  PSVT and PAF (without documented tracings; CHADS VASC < 2) on flecainide, primary hypertension, diastolic dysfunction, and family h/o CAD (father), DM and primary hypertension.    Is doing well.  She has had no interval episodes of atrial fibrillation.  She is concerned about blood pressures have been running above 140 mmHg.  Past Medical History:  Diagnosis Date   Abnormal exercise tolerance test 11/24/2012   Did not reach 85% maximum heart rate. No ischemic changes noted at 79%. Notably beta blocker was not held; no arrhythmias noted.   Episcleritis of right eye    H/O echocardiogram OS 20/40   EF 60-65% with mild concentric LVH. Normal wall motion. Grade 1 diastolic dysfunction. Trace aortic regurgitation. No clear-cut evidence of murmur source   Hot flashes    Hypertension    Lymphocytic colitis    PAF (paroxysmal atrial fibrillation) [facility]) July 2014   Paroxysmal atrial flutter [facility]) August 2014   Paroxysmal SVT (supraventricular tachycardia) 10/06/2012   This may have simply been misdiagnosed A. fib   Scleritis and episcleritis of right eye 08/28/2012    Past Surgical History:  Procedure Laterality Date   BREAST BIOPSY Left 10/2020   CESAREAN SECTION     VARICOSE VEIN SURGERY  1990   WRIST SURGERY Left     Current Medications: Current Meds  Medication Sig   Biotin 5000 MCG CAPS Take 1 capsule by mouth daily.   diltiazem (CARDIZEM CD) 180 MG 24 hr capsule Take 1 capsule (180 mg total) by mouth daily.   flecainide (TAMBOCOR) 100 MG tablet TAKE 1 TABLET BY MOUTH TWICE A DAY   Multiple Vitamin  (MULTIVITAMIN) tablet Take 1 tablet by mouth daily.   Omega 3 1000 MG CAPS Take 1 capsule by mouth daily.   [DISCONTINUED] diltiazem (CARDIZEM CD) 120 MG 24 hr capsule TAKE 1 CAPSULE BY MOUTH EVERY DAY     Allergies:   Patient has no known allergies.   Social History   Socioeconomic History   Marital status: Married    Spouse name: Not on file   Number of children: Not on file   Years of education: Not on file   Highest education level: Professional school degree (e.g., MD, DDS, DVM, JD)  Occupational History   Occupation: Associate Theme park manager    Comment: [place], [place]  Tobacco Use   Smoking status: Never   Smokeless tobacco: Never  Vaping Use   Vaping Use: Never used  Substance and Sexual Activity   Alcohol use: Yes    Comment: 2 glasses wine daily   Drug use: No   Sexual activity: Not on file  Other Topics Concern   Not on file  Social History Narrative   She is a married mother of 3.    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   Social Determinants of Health   Financial Resource Strain: Not on file  Food Insecurity: Not on file  Transportation Needs: Not on file  Physical Activity: Not on file  Stress: Not on file  Social Connections: Not on file     Family History: The patient's family history includes Alcohol abuse in her maternal grandfather; Breast cancer in her sister; Cancer (age of onset: 38) in her sister; Diabetes in her mother; Healthy in her maternal grandmother; Heart attack (age of onset: 65) in her father; Hypertension in her father and mother; Stroke in her father; Ulcerative colitis in her son. There is no history of Colon cancer, Esophageal cancer, Rectal cancer, or Stomach cancer.  ROS:    Please see the history of present illness.    No neurological symptoms.  All other systems reviewed and are negative.  EKGs/Labs/Other Studies Reviewed:    The following studies were reviewed today: No recent imaging  EKG:  EKG normal sinus rhythm, biatrial abnormality, interventricular conduction delay/left anterior hemiblock.  Recent Labs: 11/14/2021: ALT 46; BUN 9; Creatinine, Ser 0.80; Hemoglobin 14.8; Platelets 260.0; Potassium 4.6; Sodium 133; TSH 0.98  Recent Lipid Panel    Component Value Date/Time   CHOL 189 11/14/2021 1531   TRIG 86.0 11/14/2021 1531   HDL 108.40 11/14/2021 1531   CHOLHDL 2 11/14/2021 1531   VLDL 17.2 11/14/2021 1531   LDLCALC 63 11/14/2021 1531   LDLCALC 88 09/16/2019 1126    Physical Exam:    VS:  BP (!) 148/86   Pulse 61   Ht 5' 1.25" (1.556 m)   Wt 150 lb 12.8 oz (68.4 kg)   SpO2 98%   BMI 28.26 kg/m     Wt Readings from Last 3 Encounters:  02/06/22 150 lb 12.8 oz (68.4 kg)  11/14/21 148 lb 1.6 oz (67.2 kg)  11/03/20 159 lb 6.4 oz (72.3 kg)     GEN: Slightly overweight. No acute distress HEENT: Normal NECK: No JVD. LYMPHATICS: No lymphadenopathy CARDIAC: Soft 1/6 mid left sternal border systolic murmur. RRR no gallop, or edema. VASCULAR:  normal Pulses. No bruits. RESPIRATORY:  Clear to auscultation without rales, wheezing or rhonchi  ABDOMEN: Soft, non-tender, non-distended, No pulsatile mass, MUSCULOSKELETAL: No deformity  SKIN: Warm and dry NEUROLOGIC:  Alert and oriented x 3 PSYCHIATRIC:  Normal affect   ASSESSMENT:    1. Paroxysmal SVT (supraventricular tachycardia)   2. Essential hypertension   3. Hyperlipidemia, unspecified hyperlipidemia type   4. Aortic valve sclerosis    PLAN:    In order of problems listed above:  No recurrence of SVT or atrial fibrillation in the 12 months since the last visit. Increase diltiazem CD to 180 mg/day.  We will see if this brings the systolic blood pressure consistently less than  140 mmHg.  If not we could further increase the dose or add low-dose ACE inhibitor/ARB or diuretic. Continue clinical follow-up.  LDL is less than 70.  Management is omega-3 fatty acid therapy. Current murmur is consistent with aortic valve sclerosis.  1 year follow-up.  Dr. Johney Frame is recommended.   Medication Adjustments/Labs and Tests Ordered: Current medicines are reviewed at length with the patient today.  Concerns regarding medicines are outlined above.  Orders Placed This Encounter  Procedures   EKG 12-Lead   Meds ordered this encounter  Medications   diltiazem (CARDIZEM CD) 180 MG 24 hr capsule    Sig: Take 1 capsule (180 mg  total) by mouth daily.    Dispense:  90 capsule    Refill:  3    Dose change (increased)    Patient Instructions  Medication Instructions:  Your physician has recommended you make the following change in your medication:   1) INCREASE diltiazem (Cardizem) to '180mg'$  daily  *If you need a refill on your cardiac medications before your next appointment, please call your pharmacy*  Follow-Up: At [facility], you and your health needs are our priority.  As part of our continuing mission to provide you with exceptional heart care, we have created designated Provider Care Teams.  These Care Teams include your primary Cardiologist (physician) and Advanced Practice Providers (APPs -  Physician Assistants and Nurse Practitioners) who all work together to provide you with the care you need, when you need it.  Your next appointment:   1 year(s)  The format for your next appointment:   In Person  Provider:   Gwyndolyn Kaufman, MD  Important Information About Sugar         Signed, Sinclair Grooms, MD  02/06/2022 4:35 PM    [place]

## 2022-02-06 ENCOUNTER — Ambulatory Visit: Payer: BC Managed Care – PPO | Attending: Interventional Cardiology | Admitting: Interventional Cardiology

## 2022-02-06 ENCOUNTER — Encounter: Payer: Self-pay | Admitting: Interventional Cardiology

## 2022-02-06 VITALS — BP 148/86 | HR 61 | Ht 61.25 in | Wt 150.8 lb

## 2022-02-06 DIAGNOSIS — E785 Hyperlipidemia, unspecified: Secondary | ICD-10-CM | POA: Diagnosis not present

## 2022-02-06 DIAGNOSIS — I471 Supraventricular tachycardia, unspecified: Secondary | ICD-10-CM | POA: Diagnosis not present

## 2022-02-06 DIAGNOSIS — I10 Essential (primary) hypertension: Secondary | ICD-10-CM

## 2022-02-06 DIAGNOSIS — I358 Other nonrheumatic aortic valve disorders: Secondary | ICD-10-CM | POA: Diagnosis not present

## 2022-02-06 MED ORDER — DILTIAZEM HCL ER COATED BEADS 180 MG PO CP24: 180.0000 mg | ORAL_CAPSULE | Freq: Every day | ORAL | 3 refills | Status: DC

## 2022-02-06 NOTE — Patient Instructions (Signed)
Medication Instructions:  Your physician has recommended you make the following change in your medication:   1) INCREASE diltiazem (Cardizem) to '180mg'$  daily  *If you need a refill on your cardiac medications before your next appointment, please call your pharmacy*  Follow-Up: At [facility], you and your health needs are our priority.  As part of our continuing mission to provide you with exceptional heart care, we have created designated Provider Care Teams.  These Care Teams include your primary Cardiologist (physician) and Advanced Practice Providers (APPs -  Physician Assistants and Nurse Practitioners) who all work together to provide you with the care you need, when you need it.  Your next appointment:   1 year(s)  The format for your next appointment:   In Person  Provider:   Gwyndolyn Kaufman, MD  Important Information About Sugar

## 2022-02-25 ENCOUNTER — Other Ambulatory Visit: Payer: Self-pay | Admitting: Interventional Cardiology

## 2022-03-14 ENCOUNTER — Encounter: Payer: Self-pay | Admitting: Gastroenterology

## 2022-03-14 ENCOUNTER — Ambulatory Visit: Payer: Medicare Other | Admitting: Gastroenterology

## 2022-03-14 VITALS — BP 124/80 | HR 70 | Ht 61.25 in | Wt 154.0 lb

## 2022-03-14 DIAGNOSIS — R7989 Other specified abnormal findings of blood chemistry: Secondary | ICD-10-CM

## 2022-03-14 DIAGNOSIS — K838 Other specified diseases of biliary tract: Secondary | ICD-10-CM | POA: Diagnosis not present

## 2022-03-14 NOTE — Patient Instructions (Signed)
You have been scheduled for an MRI/MRCP at [facility] on 03/27/22. Your appointment time is 3:00pm. Please arrive to admitting (at main entrance of the hospital) 30 minutes prior to your appointment time for registration purposes. Please make certain not to have anything to eat or drink 4 hours prior to your test. In addition, if you have any metal in your body, have a pacemaker or defibrillator, please be sure to let your ordering physician know. This test typically takes 45 minutes to 1 hour to complete. Should you need to reschedule, please call [phone] to do so.  The [facility] providers would like to encourage you to use [facility] to communicate with providers for non-urgent requests or questions.  Due to long hold times on the telephone, sending your provider a message by [facility] may be a faster and more efficient way to get a response.  Please allow 48 business hours for a response.  Please remember that this is for non-urgent requests.   Due to recent changes in healthcare laws, you may see the results of your imaging and laboratory studies on MyChart before your provider has had a chance to review them.  We understand that in some cases there may be results that are confusing or concerning to you. Not all laboratory results come back in the same time frame and the provider may be waiting for multiple results in order to interpret others.  Please give [place] 48 hours in order for your provider to thoroughly review all the results before contacting the office for clarification of your results.   Thank you for choosing me and [facility].  Pricilla Riffle. Dagoberto Ligas., MD., Marval Regal

## 2022-03-14 NOTE — Progress Notes (Signed)
Assessment    Elevated LFTs, dilated CBD, cholelithiasis. R/O CBD stones  History of lymphocytic colitis  Recommendations   Schedule abdominal MRI / MRCP.  Pending findings proceed with surgical referral and possibly ERCP.     HPI   Chief complaint: Elevated LFTs, dilated CBD  Patient profile:  Veronica Valencia is a 67 y.o. female referred by Loralyn Freshwater, MD for evaluation of elevated LFTs and dilated common bile duct on ultrasound.  Mildly elevated transaminases and alk phos were noted in July 2022 and again noted in September 2023.  Abdominal ultrasound showed cholelithiasis, gallbladder sludge and common bile duct dilated to 10.8 mm.  She relates mild transient nausea just prior to a bowel movement which resolved promptly.  She relates an episode of diarrhea last for about a day about once per year.  She has not had any prolonged episodes of diarrhea as she had in 2019 when lymphocytic colitis was diagnosed.  She has no other gastrointestinal complaints. Denies weight loss, abdominal pain, constipation, change in stool caliber, melena, hematochezia, vomiting, dysphagia, reflux symptoms, chest pain.   Previous Labs / Imaging::    Latest Ref Rng & Units 11/14/2021    3:31 PM 09/21/2020   11:33 AM 09/16/2019   11:26 AM  CBC  WBC 4.0 - 10.5 K/uL 10.0  6.6  7.4   Hemoglobin 12.0 - 15.0 g/dL 14.8  14.7  14.1   Hematocrit 36.0 - 46.0 % 43.2  42.4  41.7   Platelets 150.0 - 400.0 K/uL 260.0  246.0  293     No results found for: "LIPASE"    Latest Ref Rng & Units 11/14/2021    3:31 PM 09/21/2020   11:33 AM 10/16/2019    2:56 PM  CMP  Glucose 70 - 99 mg/dL 85  104  112   BUN 6 - 23 mg/dL '9  9  14   '$ Creatinine 0.40 - 1.20 mg/dL 0.80  0.78  1.04   Sodium 135 - 145 mEq/L 133  138  136   Potassium 3.5 - 5.1 mEq/L 4.6  4.3  4.7   Chloride 96 - 112 mEq/L 95  98  99   CO2 19 - 32 mEq/L '27  27  28   '$ Calcium 8.4 - 10.5 mg/dL 10.2  10.3  9.7   Total Protein 6.0 - 8.3 g/dL 7.8  7.5     Total Bilirubin 0.2 - 1.2 mg/dL 0.8  0.7    Alkaline Phos 39 - 117 U/L 141  126    AST 0 - 37 U/L 70  68    ALT 0 - 35 U/L 46  43       Previous GI evaluation    Endoscopies:  Colonoscopy Oct 2019  Imaging:  US ABDOMEN LIMITED RUQ (LIVER/GB) CLINICAL DATA:  Elevated LFTs.  No symptoms.  EXAM: ULTRASOUND ABDOMEN LIMITED RIGHT UPPER QUADRANT  COMPARISON:  None Available.  FINDINGS: Gallbladder:  Sludge and stones are identified in the gallbladder without wall thickening, pericholecystic fluid, or Murphy's sign.  Common bile duct:  Diameter: 10.8 mm.  Liver:  Diffuse increased echogenicity throughout the liver. Portal vein is patent on color Doppler imaging with normal direction of blood flow towards the liver.  Other: A 4.6 cm mildly complicated cyst is seen in the upper pole of the right kidney. No follow-up imaging recommended for this cyst.  IMPRESSION: 1. The common bile duct is abnormally dilated to 10.8 mm. Recommend MRCP or ERCP for  further evaluation. 2. Sludge and stones in the gallbladder. The gallbladder is otherwise normal. 3. Diffuse increased echogenicity throughout the liver is nonspecific but may be seen with hepatic steatosis or other underlying hepatocellular disease.  These results will be called to the ordering clinician or representative by the Radiologist Assistant, and communication documented in the PACS or [employer].  Electronically Signed   By: Dorise Bullion III M.D.   On: 12/27/2021 15:28    Past Medical History:  Diagnosis Date   Abnormal exercise tolerance test 11/24/2012   Did not reach 85% maximum heart rate. No ischemic changes noted at 79%. Notably beta blocker was not held; no arrhythmias noted.   Episcleritis of right eye    H/O echocardiogram OS 20/40   EF 60-65% with mild concentric LVH. Normal wall motion. Grade 1 diastolic dysfunction. Trace aortic regurgitation. No clear-cut evidence of murmur source    Hot flashes    Hypertension    Lymphocytic colitis    PAF (paroxysmal atrial fibrillation) ([place]) July 2014   Paroxysmal atrial flutter [facility]) August 2014   Paroxysmal SVT (supraventricular tachycardia) 10/06/2012   This may have simply been misdiagnosed A. fib   Scleritis and episcleritis of right eye 08/28/2012   Past Surgical History:  Procedure Laterality Date   BREAST BIOPSY Left 10/2020   CESAREAN SECTION     VARICOSE VEIN SURGERY  1990   WRIST SURGERY Left    Family History  Problem Relation Age of Onset   Diabetes Mother    Hypertension Mother    Stroke Father    Hypertension Father    Heart attack Father 74   Cancer Sister 63       breast   Breast cancer Sister    Ulcerative colitis Son    Healthy Maternal Grandmother    Alcohol abuse Maternal Grandfather    Colon cancer Neg Hx    Esophageal cancer Neg Hx    Rectal cancer Neg Hx    Stomach cancer Neg Hx    Social History   Tobacco Use   Smoking status: Never   Smokeless tobacco: Never  Vaping Use   Vaping Use: Never used  Substance Use Topics   Alcohol use: Yes    Comment: 2 glasses wine daily   Drug use: No   Current Outpatient Medications  Medication Sig Dispense Refill   Biotin 5000 MCG CAPS Take 1 capsule by mouth daily.     diltiazem (CARDIZEM CD) 180 MG 24 hr capsule Take 1 capsule (180 mg total) by mouth daily. 90 capsule 3   flecainide (TAMBOCOR) 100 MG tablet TAKE 1 TABLET BY MOUTH TWICE A DAY 180 tablet 0   Multiple Vitamin (MULTIVITAMIN) tablet Take 1 tablet by mouth daily.     Omega 3 1000 MG CAPS Take 1 capsule by mouth daily.     No current facility-administered medications for this visit.   No Known Allergies  Review of Systems: All other systems reviewed and negative except where noted in HPI.    Physical Exam    Wt Readings from Last 3 Encounters:  03/14/22 154 lb (69.9 kg)  02/06/22 150 lb 12.8 oz (68.4 kg)  11/14/21 148 lb 1.6 oz (67.2 kg)    Ht 5' 1.25" (1.556 m)   Wt  154 lb (69.9 kg)   BMI 28.86 kg/m  Constitutional:  Generally well appearing female in no acute distress. HEENT: Pupils normal.  Conjunctivae are normal. No scleral icterus. No oral lesions or deformities  noted.  Neck: Supple.  Cardiac: Normal rate, regular rhythm without murmurs. Pulmonary/chest: Effort normal and breath sounds normal. No wheezing, rales or rhonchi. Abdominal: Soft, nondistended, nontender. Active bowel sounds. No palpable HSM, masses or hernias. Rectal: Not done Extremities: No edema or deformities noted Neurological: Alert and oriented to person, place and time. Psychiatric: Pleasant. Normal mood and affect. Behavior is normal. Skin: Skin is warm and dry. No rashes noted.  Lucio Edward, MD   cc:  Referring Provider Farrel Conners, MD

## 2022-03-27 ENCOUNTER — Other Ambulatory Visit: Payer: Self-pay | Admitting: Gastroenterology

## 2022-03-27 ENCOUNTER — Ambulatory Visit (HOSPITAL_COMMUNITY)
Admission: RE | Admit: 2022-03-27 | Discharge: 2022-03-27 | Disposition: A | Discharge status: Home / Self Care | Payer: Medicare Other | Source: Ambulatory Visit | Attending: Gastroenterology | Admitting: Gastroenterology

## 2022-03-27 DIAGNOSIS — K838 Other specified diseases of biliary tract: Secondary | ICD-10-CM

## 2022-03-27 DIAGNOSIS — R7989 Other specified abnormal findings of blood chemistry: Secondary | ICD-10-CM | POA: Diagnosis present

## 2022-03-27 MED ORDER — GADOBUTROL 1 MMOL/ML IV SOLN
7.0000 mL | Freq: Once | INTRAVENOUS | Status: AC | PRN
  Administered 2022-03-27: 7 mL via INTRAVENOUS

## 2022-03-29 ENCOUNTER — Other Ambulatory Visit: Payer: Self-pay

## 2022-03-29 DIAGNOSIS — R7989 Other specified abnormal findings of blood chemistry: Secondary | ICD-10-CM

## 2022-04-03 ENCOUNTER — Other Ambulatory Visit (INDEPENDENT_AMBULATORY_CARE_PROVIDER_SITE_OTHER): Payer: Medicare Other

## 2022-04-03 DIAGNOSIS — R7989 Other specified abnormal findings of blood chemistry: Secondary | ICD-10-CM | POA: Diagnosis not present

## 2022-04-03 LAB — IBC + FERRITIN
Ferritin: 532.1 ng/mL — ABNORMAL HIGH (ref 10.0–291.0)
Iron: 196 ug/dL — ABNORMAL HIGH (ref 42–145)
Saturation Ratios: 54.9 % — ABNORMAL HIGH (ref 20.0–50.0)
TIBC: 357 ug/dL (ref 250.0–450.0)
Transferrin: 255 mg/dL (ref 212.0–360.0)

## 2022-04-04 ENCOUNTER — Other Ambulatory Visit: Payer: Self-pay

## 2022-04-04 ENCOUNTER — Telehealth: Payer: Self-pay | Admitting: Gastroenterology

## 2022-04-04 DIAGNOSIS — K838 Other specified diseases of biliary tract: Secondary | ICD-10-CM

## 2022-04-04 DIAGNOSIS — R7989 Other specified abnormal findings of blood chemistry: Secondary | ICD-10-CM

## 2022-04-04 NOTE — Telephone Encounter (Signed)
See alternate results note.  

## 2022-04-04 NOTE — Telephone Encounter (Signed)
Incoming call from patient states she is returning a call from [place]

## 2022-04-06 LAB — ANTI-SMOOTH MUSCLE ANTIBODY, IGG: Actin (Smooth Muscle) Antibody (IGG): <20 U (ref <20)

## 2022-04-06 LAB — HEPATITIS C ANTIBODY: Hepatitis C Ab: NONREACTIVE

## 2022-04-06 LAB — CERULOPLASMIN: Ceruloplasmin: 33 mg/dL (ref 18–53)

## 2022-04-06 LAB — ALPHA-1-ANTITRYPSIN: A-1 Antitrypsin, Ser: 94 mg/dL (ref 83–199)

## 2022-04-06 LAB — ANTI-NUCLEAR AB-TITER (ANA TITER): ANA Titer 1: 1:40 {titer} — ABNORMAL HIGH

## 2022-04-06 LAB — HEPATITIS B SURFACE ANTIGEN: Hepatitis B Surface Ag: NONREACTIVE

## 2022-04-06 LAB — TISSUE TRANSGLUTAMINASE, IGA: (tTG) Ab, IgA: <1 U/mL

## 2022-04-06 LAB — HEPATITIS B SURFACE ANTIBODY,QUALITATIVE: Hep B S Ab: REACTIVE — AB

## 2022-04-06 LAB — IGA: Immunoglobulin A: 183 mg/dL (ref 70–320)

## 2022-04-06 LAB — IGG: IgG (Immunoglobin G), Serum: 1232 mg/dL (ref 600–1540)

## 2022-04-06 LAB — ANA: Anti Nuclear Antibody (ANA): POSITIVE — AB

## 2022-04-06 LAB — MITOCHONDRIAL ANTIBODIES: Mitochondrial M2 Ab, IgG: <=20 U (ref <=20.0)

## 2022-04-13 ENCOUNTER — Other Ambulatory Visit: Payer: Self-pay

## 2022-04-13 ENCOUNTER — Other Ambulatory Visit: Payer: Medicare Other

## 2022-04-13 DIAGNOSIS — K838 Other specified diseases of biliary tract: Secondary | ICD-10-CM

## 2022-04-13 DIAGNOSIS — R7989 Other specified abnormal findings of blood chemistry: Secondary | ICD-10-CM

## 2022-04-13 MED ORDER — FLECAINIDE ACETATE 100 MG PO TABS: 100.0000 mg | ORAL_TABLET | Freq: Two times a day (BID) | ORAL | 3 refills | Status: DC

## 2022-04-20 ENCOUNTER — Telehealth: Payer: Self-pay | Admitting: Gastroenterology

## 2022-04-20 NOTE — Telephone Encounter (Signed)
The pt has been advised that the lab is still pending.  We will contact her as soon as able.

## 2022-04-20 NOTE — Telephone Encounter (Signed)
PT is calling to get lab results from blood work on 04/13/2022. Please advise

## 2022-04-27 LAB — HEMOCHROMATOSIS DNA-PCR(C282Y,H63D)

## 2022-05-01 ENCOUNTER — Other Ambulatory Visit: Payer: Self-pay

## 2022-05-01 DIAGNOSIS — R7989 Other specified abnormal findings of blood chemistry: Secondary | ICD-10-CM

## 2022-05-02 ENCOUNTER — Telehealth: Payer: Self-pay | Admitting: Physician Assistant

## 2022-05-02 NOTE — Telephone Encounter (Signed)
Scheduled appt per 2/27 referral. Pt is aware of appt date and time. Pt is aware to arrive 15 mins prior to appt time and to bring and updated insurance card. Pt is aware of appt location.   °

## 2022-05-03 ENCOUNTER — Encounter: Payer: Self-pay | Admitting: Family Medicine

## 2022-05-30 ENCOUNTER — Inpatient Hospital Stay: Payer: Medicare Other

## 2022-05-30 ENCOUNTER — Inpatient Hospital Stay: Payer: Medicare Other | Attending: Physician Assistant | Admitting: Physician Assistant

## 2022-05-30 ENCOUNTER — Encounter: Payer: Self-pay | Admitting: Physician Assistant

## 2022-05-30 DIAGNOSIS — Z803 Family history of malignant neoplasm of breast: Secondary | ICD-10-CM

## 2022-05-30 DIAGNOSIS — R7989 Other specified abnormal findings of blood chemistry: Secondary | ICD-10-CM

## 2022-05-30 DIAGNOSIS — I10 Essential (primary) hypertension: Secondary | ICD-10-CM | POA: Insufficient documentation

## 2022-05-30 DIAGNOSIS — Z79899 Other long term (current) drug therapy: Secondary | ICD-10-CM

## 2022-05-30 DIAGNOSIS — R9389 Abnormal findings on diagnostic imaging of other specified body structures: Secondary | ICD-10-CM

## 2022-05-30 LAB — CBC WITH DIFFERENTIAL (CANCER CENTER ONLY)
Abs Immature Granulocytes: 0.05 10*3/uL (ref 0.00–0.07)
Basophils Absolute: 0.1 10*3/uL (ref 0.0–0.1)
Basophils Relative: 1 %
Eosinophils Absolute: 0.1 10*3/uL (ref 0.0–0.5)
Eosinophils Relative: 1 %
HCT: 42.5 % (ref 36.0–46.0)
Hemoglobin: 15.1 g/dL — ABNORMAL HIGH (ref 12.0–15.0)
Immature Granulocytes: 0 %
Lymphocytes Relative: 8 %
Lymphs Abs: 1 10*3/uL (ref 0.7–4.0)
MCH: 34.7 pg — ABNORMAL HIGH (ref 26.0–34.0)
MCHC: 35.5 g/dL (ref 30.0–36.0)
MCV: 97.7 fL (ref 80.0–100.0)
Monocytes Absolute: 1.4 10*3/uL — ABNORMAL HIGH (ref 0.1–1.0)
Monocytes Relative: 11 %
Neutro Abs: 10 10*3/uL — ABNORMAL HIGH (ref 1.7–7.7)
Neutrophils Relative %: 79 %
Platelet Count: 276 10*3/uL (ref 150–400)
RBC: 4.35 MIL/uL (ref 3.87–5.11)
RDW: 11.3 % — ABNORMAL LOW (ref 11.5–15.5)
WBC Count: 12.6 10*3/uL — ABNORMAL HIGH (ref 4.0–10.5)
nRBC: 0 % (ref 0.0–0.2)

## 2022-05-30 LAB — CMP (CANCER CENTER ONLY)
ALT: 36 U/L (ref 0–44)
AST: 50 U/L — ABNORMAL HIGH (ref 15–41)
Albumin: 4.6 g/dL (ref 3.5–5.0)
Alkaline Phosphatase: 143 U/L — ABNORMAL HIGH (ref 38–126)
Anion gap: 9 (ref 5–15)
BUN: 12 mg/dL (ref 8–23)
CO2: 28 mmol/L (ref 22–32)
Calcium: 9.9 mg/dL (ref 8.9–10.3)
Chloride: 93 mmol/L — ABNORMAL LOW (ref 98–111)
Creatinine: 0.98 mg/dL (ref 0.44–1.00)
GFR, Estimated: >60 mL/min (ref >60)
Glucose, Bld: 103 mg/dL — ABNORMAL HIGH (ref 70–99)
Potassium: 4.9 mmol/L (ref 3.5–5.1)
Sodium: 130 mmol/L — ABNORMAL LOW (ref 135–145)
Total Bilirubin: 0.6 mg/dL (ref 0.3–1.2)
Total Protein: 8.1 g/dL (ref 6.5–8.1)

## 2022-05-30 LAB — IRON AND IRON BINDING CAPACITY (CC-WL,HP ONLY)
Iron: 185 ug/dL — ABNORMAL HIGH (ref 28–170)
Saturation Ratios: 48 % — ABNORMAL HIGH (ref 10.4–31.8)
TIBC: 388 ug/dL (ref 250–450)
UIBC: 203 ug/dL

## 2022-05-30 NOTE — Progress Notes (Signed)
[place] [phone]   Fax:([phone]) [place] NOTE  Patient Care Team: Glenis Smoker, MD as PCP - General (Family Medicine) Belva Crome, MD (Inactive) as PCP - Cardiology (Cardiology)  Hematological/Oncological History 7/20/20223: Elevated LFTs with AST 68, ALT 43, Alk Phos 126.  11/14/2021: Elevated LFTs with AST 70, ALT 46, Alk Phos 141 12/27/2021: Abdominal US showed common bile duct is abnormally dilated to 10.8 mm. Sludge and stones in the gallbladder. Diffuse increased echogenicity through the liver is nonspecific. 03/14/2022: Established care with Dr. Fuller Plan at [facility]. 03/24/2022: Iron 196 (H), Saturation 54.9% (H), Ferritin 532.1 (H) 03/27/2022: MRCP: Cholelithiasis, mild biliary ductal dilatation without evidence of choledocholithiasis or stricture, mild hepatic steatosis. 04/13/2022: Hemochromatosis DNA testing showed homozygous mutation of H63D gene.  05/30/2022: Establish care with [facility]  CHIEF COMPLAINTS/PURPOSE OF CONSULTATION:  "Hereditary Hemochromatosis "  HISTORY OF PRESENTING ILLNESS:  Veronica Valencia 67 y.o. female presents to the hematology clinic for recently diagnosed hereditary hemochromatosis. She is unaccompanied for this visit.   On exam today, Ms. Mazzotta reports she is doing well without any new or concerning symptoms. Her energy and appetite are stable. She does not have any dietary restrictions. She denies nausea, vomiting or abdominal pain. Her bowel habits are unchanged without recurrent episodes of diarrhea or constipation. She denies easy bruising or signs of bleeding. She denies fevers, chills, sweats, shortness of breath, chest pain or cough. She has no other complaints. Rest of the 10 point ROS is below.   MEDICAL HISTORY:  Past Medical History:  Diagnosis Date   Abnormal exercise tolerance test 11/24/2012   Did not reach 85% maximum heart rate. No ischemic changes noted at 79%. Notably  beta blocker was not held; no arrhythmias noted.   Episcleritis of right eye    H/O echocardiogram OS 20/40   EF 60-65% with mild concentric LVH. Normal wall motion. Grade 1 diastolic dysfunction. Trace aortic regurgitation. No clear-cut evidence of murmur source   Hot flashes    Hypertension    Lymphocytic colitis    PAF (paroxysmal atrial fibrillation) ([place]) July 2014   Paroxysmal atrial flutter [facility]) August 2014   Paroxysmal SVT (supraventricular tachycardia) 10/06/2012   This may have simply been misdiagnosed A. fib   Scleritis and episcleritis of right eye 08/28/2012    SURGICAL HISTORY: Past Surgical History:  Procedure Laterality Date   BREAST BIOPSY Left 10/2020   CESAREAN SECTION     VARICOSE VEIN SURGERY  1990   WRIST SURGERY Left     SOCIAL HISTORY: Social History   Socioeconomic History   Marital status: Married    Spouse name: Not on file   Number of children: 3   Years of education: Not on file   Highest education level: Professional school degree (e.g., MD, DDS, DVM, JD)  Occupational History   Occupation: Associate Theme park manager    Comment: [employer]   Occupation: minister  Tobacco Use   Smoking status: Never   Smokeless tobacco: Never  Scientific laboratory technician Use: Never used  Substance and Sexual Activity   Alcohol use: Yes    Comment: 2 glasses wine daily   Drug use: No   Sexual activity: Not on file  Other Topics Concern   Not on file  Social History Narrative   She is a married mother of 3.    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   Social Determinants of Health   Financial Resource Strain: Not on file  Food Insecurity: No Food Insecurity (05/30/2022)   Hunger Vital Sign    Worried  About Running Out of Food in the Last Year: Never true    Ran Out of Food in the Last Year: Never true  Transportation Needs: No Transportation Needs (05/30/2022)   PRAPARE - Hydrologist (Medical): No    Lack of Transportation (Non-Medical): No  Physical Activity: Not on file  Stress: Not on file  Social Connections: Not on file  Intimate Partner Violence: Not At Risk (05/30/2022)   Humiliation, Afraid, Rape, and Kick questionnaire    Fear of Current or Ex-Partner: No    Emotionally Abused: No    Physically Abused: No    Sexually Abused: No    FAMILY HISTORY: Family History  Problem Relation Age of Onset   Diabetes Mother    Hypertension Mother    Stroke Father    Hypertension Father    Heart attack Father 55   Cancer Sister 62       breast   Breast cancer Sister    Ulcerative colitis Son    Healthy Maternal Grandmother    Alcohol abuse Maternal Grandfather    Colon cancer Neg Hx    Esophageal cancer Neg Hx    Rectal cancer Neg Hx    Stomach cancer Neg Hx     ALLERGIES:  has No Known Allergies.  MEDICATIONS:  Current Outpatient Medications  Medication Sig Dispense Refill   Biotin 5000 MCG CAPS Take 1 capsule by mouth daily.     diltiazem (CARDIZEM CD) 180 MG 24 hr capsule Take 1 capsule (180 mg total) by mouth daily. 90 capsule 3   flecainide (TAMBOCOR) 100 MG tablet Take 1 tablet (100 mg total) by mouth 2 (two) times daily. 180 tablet 3   losartan (COZAAR) 25 MG tablet Take 25 mg by mouth daily.     Multiple Vitamin (MULTIVITAMIN) tablet Take 1 tablet by mouth daily.     Omega 3 1000 MG CAPS Take 1 capsule by mouth daily.     No current facility-administered medications for this visit.    REVIEW OF SYSTEMS:   Constitutional: ( - ) fevers, ( - )  chills , ( - ) night sweats Eyes: ( - ) blurriness of vision, ( - ) double vision, ( - ) watery eyes Ears, nose, mouth, throat, and face: ( - ) mucositis, ( - ) sore throat Respiratory: ( - )  cough, ( - ) dyspnea, ( - ) wheezes Cardiovascular: ( - ) palpitation, ( - ) chest discomfort, ( - ) lower extremity swelling Gastrointestinal:  ( - ) nausea, ( - ) heartburn, ( - ) change in bowel habits Skin: ( - ) abnormal skin rashes Lymphatics: ( - ) new lymphadenopathy, ( - ) easy bruising Neurological: ( - ) numbness, ( - ) tingling, ( - ) new weaknesses Behavioral/Psych: ( - ) mood change, ( - ) new changes  All other systems were reviewed with the patient and are negative.  PHYSICAL EXAMINATION: ECOG PERFORMANCE STATUS: 0 - Asymptomatic  Vitals:   05/30/22 1424  BP: (!) 156/68  Pulse: 70  Resp: 20  Temp: Marland Kitchen)  97.2 F (36.2 C)  SpO2: 100%   Filed Weights   05/30/22 1424  Weight: 150 lb 12.8 oz (68.4 kg)    GENERAL: well appearing female in NAD  SKIN: skin color, texture, turgor are normal, no rashes or significant lesions EYES: conjunctiva are pink and non-injected, sclera clear OROPHARYNX: no exudate, no erythema; lips, buccal mucosa, and tongue normal  NECK: supple, non-tender LUNGS: clear to auscultation and percussion with normal breathing effort HEART: regular rate & rhythm and no murmurs and no lower extremity edema ABDOMEN: soft, non-tender, non-distended, normal bowel sounds Musculoskeletal: no cyanosis of digits and no clubbing  PSYCH: alert & oriented x 3, fluent speech NEURO: no focal motor/sensory deficits  LABORATORY DATA:  I have reviewed the data as listed    Latest Ref Rng & Units 05/30/2022    3:26 PM 11/14/2021    3:31 PM 09/21/2020   11:33 AM  CBC  WBC 4.0 - 10.5 K/uL 12.6  10.0  6.6   Hemoglobin 12.0 - 15.0 g/dL 15.1  14.8  14.7   Hematocrit 36.0 - 46.0 % 42.5  43.2  42.4   Platelets 150 - 400 K/uL 276  260.0  246.0        Latest Ref Rng & Units 05/30/2022    3:26 PM 11/14/2021    3:31 PM 09/21/2020   11:33 AM  CMP  Glucose 70 - 99 mg/dL 103  85  104   BUN 8 - 23 mg/dL 12  9  9    Creatinine 0.44 - 1.00 mg/dL 0.98  0.80  0.78   Sodium  135 - 145 mmol/L 130  133  138   Potassium 3.5 - 5.1 mmol/L 4.9  4.6  4.3   Chloride 98 - 111 mmol/L 93  95  98   CO2 22 - 32 mmol/L 28  27  27    Calcium 8.9 - 10.3 mg/dL 9.9  10.2  10.3   Total Protein 6.5 - 8.1 g/dL 8.1  7.8  7.5   Total Bilirubin 0.3 - 1.2 mg/dL 0.6  0.8  0.7   Alkaline Phos 38 - 126 U/L 143  141  126   AST 15 - 41 U/L 50  70  68   ALT 0 - 44 U/L 36  46  43      ASSESSMENT & PLAN BREAH DEFUSCO is a 67 y.o. female who presents to the clinic for newly diagnosed hereditary hemochromatosis. Hereditary hemochromatosis is a hereditary condition caused by mutations in the HFE gene, which regulates iron absorption. The most common genes mutated in this condition are the C282Y and H63D genes. Since Ms. Gagne has the homozygous mutation involving the H63D gene, therapeutic phlebotomy would be indicated if there is evidence of organ involvement. Since patient's LFTs are elevated without clear etiology, we recommend a liver biopsy to evaluate for iron deposition. If there is evidence of iron deposition, we will proceed with therapeutic phlebotomy with goal of ferritin <50.   #Hereditary hemochromatosis, homozygous mutation of H63D gene: --Confirmed on hemochromatosis genetic testing on 04/13/2022.  --Recommend liver biopsy to evaluate for iron deposition. If yes, we will proceed with therapeutic phlebotomy q 2 weeks with goal of ferritin <50.  --Discuss to consume iron rich foods and alcohol in moderation.  --Labs today to check CBC, CMP, ferritin, iron and TIBC, TSH levels -- We will obtain echo to evaluate for iron deposition of the heart.  --RTC once workup is complete  Orders Placed This Encounter  Procedures   CBC with Differential (Cancer Center Only)    Standing Status:   Future    Number of Occurrences:   1    Standing Expiration Date:   05/30/2023   CMP ([facility] only)    Standing Status:   Future    Number of Occurrences:   1    Standing Expiration Date:    05/30/2023   Ferritin    Standing Status:   Future    Number of Occurrences:   1    Standing Expiration Date:   05/30/2023   Iron and Iron Binding Capacity ([facility] only)    Standing Status:   Future    Number of Occurrences:   1    Standing Expiration Date:   05/30/2023   TSH    Standing Status:   Future    Number of Occurrences:   1    Standing Expiration Date:   05/30/2023   ECHOCARDIOGRAM COMPLETE    Standing Status:   Future    Standing Expiration Date:   05/30/2023    Order Specific Question:   Where should this test be performed    Answer:   [facility]    Order Specific Question:   Perflutren DEFINITY (image enhancing agent) should be administered unless hypersensitivity or allergy exist    Answer:   Administer Perflutren    Order Specific Question:   Reason for exam-Echo    Answer:   Other-Full Diagnosis List    Order Specific Question:   Full ICD-10/Reason for Exam    Answer:   Hereditary hemochromatosis [275.01.ICD-9-CM]    All questions were answered. The patient knows to call the clinic with any problems, questions or concerns.  I have spent a total of 60 minutes minutes of face-to-face and non-face-to-face time, preparing to see the patient, obtaining and/or reviewing separately obtained history, performing a medically appropriate examination, counseling and educating the patient, ordering tests/procedures, communicating with other health care professionals, documenting clinical information in the electronic health record, i and care coordination.   Dede Query, PA-C Department of Hematology/Oncology [place] at [facility] Phone: [phone]  Patient was seen with Dr. Lorenso Courier.   I have read the above note and personally examined the patient. I agree with the assessment and plan as noted above.  Briefly Mrs. Mccay is a 67 year old female who presents for evaluation of hereditary hemochromatosis.  She is homozygous for the H63D mutation.   Interestingly the H63D mutation is rarely associated with true iron overload syndromes.  Although the patients do have baseline elevated levels of ferritin and iron saturation rarely does this contribute to organ damage.  Therefore her elevation LFTs may be secondary to another cause.  I would recommend liver biopsy in order to rule out iron deposition as the cause of her transaminitis.  In the event iron deposition is clearly seen in the liver would recommend pursuing phlebotomies in order to lower her levels.  Otherwise there is no clear indication for phlebotomies for patients with this mutation.   Ledell Peoples, MD Department of Hematology/Oncology [place] at [facility] Phone: [phone] Pager: [phone] Email: [email or web address]

## 2022-05-31 ENCOUNTER — Other Ambulatory Visit: Payer: Self-pay

## 2022-05-31 ENCOUNTER — Telehealth: Payer: Self-pay

## 2022-05-31 DIAGNOSIS — R7989 Other specified abnormal findings of blood chemistry: Secondary | ICD-10-CM

## 2022-05-31 LAB — FERRITIN: Ferritin: 481 ng/mL — ABNORMAL HIGH (ref 11–307)

## 2022-05-31 LAB — TSH: TSH: 1.476 u[IU]/mL (ref 0.350–4.500)

## 2022-05-31 NOTE — Telephone Encounter (Signed)
Inbound call from patient, returning call.

## 2022-05-31 NOTE — Telephone Encounter (Signed)
Spoke with pt and she is aware of Dr. Lynne Leader recommendations. She has been scheduled for MR of liver 06/13/2022 at 3pm at [facility].

## 2022-05-31 NOTE — Telephone Encounter (Signed)
-----   Message from Ladene Artist, MD sent at 05/30/2022  5:04 PM EDT ----- Regarding: RE: Hereditary hemochromatosis Hi Murray Hodgkins,  With elevated LFTs and the serologic evaluation for other liver diseases unrevealing, hepatic iron deposition is very likely. Hepatic MRI is generally the next step to evaluate for iron deposition as it's less invasive than liver biopsy. We will proceed with hepatic MRI.   Thanks,  MS ----- Message ----- From: Lincoln Brigham, [place] Sent: 05/30/2022   4:11 PM EDT To: Ladene Artist, MD; Orson Slick, MD Subject: Hereditary hemochromatosis                     Dr. Fuller Plan,   Dr. Lorenso Courier and I saw Ms. Rogstad for consultation for hereditary hemochromatosis. We reviewed her genetic testing which showed homozygous mutation of H63D gene. Generally, we do not recommend therapeutic phlebotomies for this gene mutation unless there is evidence of organ involvement. Since her liver enzymes are elevated, we wanted to reach out to you about pursuing a liver biopsy to confirm iron deposition versus other etiology.   Appreciate your feedback.   Thanks, Murray Hodgkins

## 2022-05-31 NOTE — Telephone Encounter (Signed)
Order entered in epic for MR of liver. Rad scheduling notified. Left message for pt to call back.

## 2022-06-04 ENCOUNTER — Telehealth: Payer: Self-pay

## 2022-06-04 NOTE — Telephone Encounter (Signed)
CA to let pt know per Dede Query PAC:  that her iron levels are still elevated but slightly improved from before. We have discussed next steps with GI (Dr. Fuller Plan) who plans to order a MRI liver to evaluate for iron deposition in the liver before we finalize the need to arrange for therapeutic phlebotomies.   Left message for pt to call back.

## 2022-06-04 NOTE — Telephone Encounter (Signed)
Pt called in /returned call: Let patient know per Dede Query PAC: that her iron levels are still elevated but slightly improved from before. We have discussed next steps with GI (Dr. Fuller Plan) who plans to order a MRI liver to evaluate for iron deposition in the liver before we finalize the need to arrange for therapeutic phlebotomies.  Pt acknowledged information and verbalized understanding. Pt has MRI appointment and appt to see Dr Fuller Plan.

## 2022-06-08 ENCOUNTER — Ambulatory Visit (HOSPITAL_COMMUNITY)
Admission: RE | Admit: 2022-06-08 | Discharge: 2022-06-08 | Disposition: A | Discharge status: Home / Self Care | Payer: Medicare Other | Source: Ambulatory Visit | Attending: Physician Assistant | Admitting: Physician Assistant

## 2022-06-08 DIAGNOSIS — I480 Paroxysmal atrial fibrillation: Secondary | ICD-10-CM | POA: Diagnosis not present

## 2022-06-08 DIAGNOSIS — I10 Essential (primary) hypertension: Secondary | ICD-10-CM | POA: Insufficient documentation

## 2022-06-08 DIAGNOSIS — I471 Supraventricular tachycardia, unspecified: Secondary | ICD-10-CM | POA: Diagnosis not present

## 2022-06-08 LAB — ECHOCARDIOGRAM COMPLETE
AR max vel: 1.98 cm2
AV Area VTI: 1.93 cm2
AV Area mean vel: 1.89 cm2
AV Mean grad: 4.5 mmHg
AV Peak grad: 8.4 mmHg
Ao pk vel: 1.45 m/s
Area-P 1/2: 2.95 cm2
Calc EF: 47.1 %
MV M vel: 4.97 m/s
MV Peak grad: 98.8 mmHg
MV VTI: 2.2 cm2
Radius: 0.4 cm
S' Lateral: 3.5 cm
Single Plane A2C EF: 51.3 %
Single Plane A4C EF: 40.6 %

## 2022-06-12 ENCOUNTER — Encounter: Payer: Self-pay | Admitting: Gastroenterology

## 2022-06-13 ENCOUNTER — Ambulatory Visit (HOSPITAL_COMMUNITY)
Admission: RE | Admit: 2022-06-13 | Discharge: 2022-06-13 | Disposition: A | Discharge status: Home / Self Care | Payer: Medicare Other | Source: Ambulatory Visit | Attending: Gastroenterology | Admitting: Gastroenterology

## 2022-06-13 MED ORDER — GADOBUTROL 1 MMOL/ML IV SOLN
6.8000 mL | Freq: Once | INTRAVENOUS | Status: AC | PRN
  Administered 2022-06-13: 6.8 mL via INTRAVENOUS

## 2022-06-14 NOTE — Telephone Encounter (Signed)
Inbound call from shirley at [facility] requesting patient be seen earlier due to a reoccurrence of colitis.   Checked for a sooner ap and unfortunately I do not have any sooner apts.  Advised shirley of no sooner apt.  States provider would like for patient to be seen sooner.   Please advise.

## 2022-06-15 NOTE — Telephone Encounter (Signed)
I have spoken to the pt and she describes having fecal urgency, loose stools for about 2 weeks.  She has a history of lymphocytic colitis and was last seen in 2019. She was prescribed budesonide at that time but she admits she only took 1 dose and stopped. She does not like to take steroids and prefers only to take budesonide for a short time. She did have the prescription on hand from 2019 and began taking that 2 weeks ago and all her symptoms have resolved.  She has 2 weeks left and wants to know if she needs to continue and what is the least amount of time she can be on it. She is going to [place] late May and prefers not to be on it at that time.  She does have an appt for follow up 6/26 with Dr Russella Dar.  She was offered a sooner appt with app prefers to see Dr Russella Dar.  She is aware that Dr Russella Dar is out of the office today and will await a call back as soon as able. Please advise

## 2022-06-17 NOTE — Telephone Encounter (Signed)
The usual budesonide course for lymphocytic colitis is 8 weeks of budesonide. Since her diarrhea resolved quickly let's try budesonide 9 mg qd for 4 weeks, then 6 mg qd for 5 days, 3 mg daily for 5 days then discontinue. If diarrhea returns then use Imodium  1-2 tid prn or a full 8 week course with budesonide.

## 2022-06-18 ENCOUNTER — Other Ambulatory Visit: Payer: Self-pay

## 2022-06-18 ENCOUNTER — Telehealth: Payer: Self-pay

## 2022-06-18 ENCOUNTER — Telehealth: Payer: Self-pay | Admitting: Physician Assistant

## 2022-06-18 MED ORDER — BUDESONIDE 3 MG PO CPEP: ORAL_CAPSULE | ORAL | 0 refills | Status: AC

## 2022-06-18 NOTE — Telephone Encounter (Signed)
can you notify patient that since MRI liver didn't show any iron overload, we will discuss with Dr. Russella Dar about getting a liver biopsy  IT If biopsy doesn't show any evidence of iron overload, we can hold on phlebotomies and patient can come back to monitor labs in 6 months   LM for pt with above recommendations.

## 2022-06-18 NOTE — Telephone Encounter (Signed)
Dr. Russella Dar,  You wanted patient to have repeat LFT's 2 months from her last set drawn by PCP. Looking at patient's chart she [facility]. When do you want repeat LFT's drawn?

## 2022-06-18 NOTE — Telephone Encounter (Signed)
Reached out to patient to schedule per Karena Addison; left voicemail

## 2022-06-18 NOTE — Telephone Encounter (Signed)
LFTs remained slightly abnormal on 3/27 so please change the repeat to 2 months from 3/27, in late May

## 2022-06-18 NOTE — Telephone Encounter (Signed)
Informed patient we will have her come to get repeat labs in May. Patient states she leaves for [place] on 5/21 and returns 6/10. Informed patient I will contact her when she gets back from vacation in June. Patient verbalized understanding.

## 2022-06-18 NOTE — Telephone Encounter (Signed)
Pt aware of Dr Ardell Isaacs recommendations. Prescription has been sent. She will take imodium if needed and call back if she does not improve.

## 2022-06-18 NOTE — Telephone Encounter (Signed)
-----   Message from Maura Crandall, [place] sent at 04/27/2022 11:31 AM EST ----- Patient had labs drawn at PCP's office on 04/24/22. LFT"s still elevated but have improved. Dr. Russella Dar wants them repeated in 2 months. Put order in Epic and call patient.

## 2022-06-25 ENCOUNTER — Telehealth: Payer: Self-pay | Admitting: Gastroenterology

## 2022-06-25 NOTE — Telephone Encounter (Signed)
Patient is calling wishing to speak with a nurse regarding her colitis and Budesonide. Please advise

## 2022-06-26 NOTE — Telephone Encounter (Signed)
The pt is calling to let Dr Russella Dar know that she is going to taper off budesonide 2 weeks early because she is going to [place] and does not want to have a "puffy face". I did advise that she should take the medication as prescribed however, I will let Dr Russella Dar know.  FYI Dr Russella Dar

## 2022-07-06 ENCOUNTER — Telehealth: Payer: Self-pay | Admitting: Gastroenterology

## 2022-07-06 NOTE — Telephone Encounter (Signed)
Patient called regarding an appointment she was advised to make with [facility]. She states that she did call and try to make an appointment but stated that he records and information needs to be sent over for the patient to be able to make an appointment. She also stated that a appointment needs to be requested for her. The phone number for the clinic is [phone] and ask to speak to [facility]. Patient requesting a call back when this is completed so she can make an appointment. Please advise, thank you.

## 2022-07-06 NOTE — Telephone Encounter (Signed)
I have spoken to the pt and advised her that all records have been sent.  She should expect a call from them in a week or so. She was asked to let me know if she does not hear from them.

## 2022-07-16 ENCOUNTER — Other Ambulatory Visit: Payer: Self-pay

## 2022-07-16 DIAGNOSIS — R197 Diarrhea, unspecified: Secondary | ICD-10-CM

## 2022-07-16 DIAGNOSIS — K51919 Ulcerative colitis, unspecified with unspecified complications: Secondary | ICD-10-CM

## 2022-07-16 MED ORDER — MESALAMINE 1.2 G PO TBEC: 2.4000 g | DELAYED_RELEASE_TABLET | Freq: Every day | ORAL | 3 refills | Status: DC

## 2022-07-16 NOTE — Telephone Encounter (Signed)
Very unusual to have systemic side effects from budesonide as it is poorly absorbed from the GI tract. It is designed to stay in the GI lumen and work by intraluminal contact with the colon lining. Busesonide is the most effective treatment for lymphocytic colitis. Avoid NSAIDs as they can worsen lymphocytic colitis. Instead of budesonide she can use Lialda 2.4g qd and Imodium 1-2 tid prn.   Send stool studies for C diff and enteric pathogens.

## 2022-07-16 NOTE — Telephone Encounter (Signed)
Patient called stated she has been having issues with Colitis these past few days "Worst then ever" and requested for a nurse call back.

## 2022-07-16 NOTE — Telephone Encounter (Signed)
The pt is calling to report  watery diarrhea 2-3 times daily that began last Friday but increased yesterday with fecal urgency.  Finished abx on last Wed for upper respiratory infection and Budesonide Thursday for colitis. She states she will not go back on budesonide, she does not want to have a swollen face when she goes to [place] and it made her very sick. She got an upper respiratory infection, pink eye and felt "horrible".  Her PCP told her it was because of the budesonide compromising her immune system.  She would like to know what else she can do so she is not having diarrhea on her trip.  Please advise (she is aware that Dr Russella Dar is out of the office today)

## 2022-07-16 NOTE — Telephone Encounter (Signed)
The pt has been contacted and made aware of the recommendations per Dr Russella Dar.  Lialda has been sent to the pharmacy and stool test ordered.  The pt agrees to try the Lialda and imodium. She will come in tomorrow for stool test.

## 2022-07-17 ENCOUNTER — Other Ambulatory Visit: Payer: Medicare Other

## 2022-07-18 ENCOUNTER — Other Ambulatory Visit: Payer: Medicare Other

## 2022-07-18 DIAGNOSIS — K51919 Ulcerative colitis, unspecified with unspecified complications: Secondary | ICD-10-CM

## 2022-07-18 DIAGNOSIS — R197 Diarrhea, unspecified: Secondary | ICD-10-CM

## 2022-07-19 LAB — CLOSTRIDIUM DIFFICILE BY PCR: Toxigenic C. Difficile by PCR: NEGATIVE

## 2022-07-20 LAB — STOOL CULTURE

## 2022-07-21 LAB — STOOL CULTURE

## 2022-07-22 LAB — STOOL CULTURE: E coli, Shiga toxin Assay: NEGATIVE

## 2022-07-25 ENCOUNTER — Other Ambulatory Visit: Payer: Self-pay | Admitting: Gastroenterology

## 2022-08-16 ENCOUNTER — Telehealth: Payer: Self-pay

## 2022-08-16 DIAGNOSIS — R7989 Other specified abnormal findings of blood chemistry: Secondary | ICD-10-CM

## 2022-08-16 NOTE — Telephone Encounter (Signed)
Placed order for LFTs.  Called and left message for patient to please go to the lab for LFTs for Dr. Russella Dar.  Provided patient with lab hours.

## 2022-08-16 NOTE — Telephone Encounter (Signed)
Dr Russella Dar the fax number that was given is incorrect. I spoke with Gus Height and the correct fax number is [phone] not [phone].  FYI for your records see below.  I have resent all the records.      [facility] or the new [facility] with Audrie Lia, MD at the [facility] office on Wednesdays. First dates are 5/8 and 6/12. Contact information for referral to Kateri Mc is Wille Celeste [phone] or [phone] FAX attn Wille Celeste.

## 2022-08-16 NOTE — Telephone Encounter (Signed)
-----   Message from Meryl Dare, MD sent at 08/16/2022 12:28 PM EDT ----- Veronica Valencia,  This patient was to be scheduled with Audrie Lia, MD at the [facility] in [place] based at [facility] for evaluation of elevated LFTs, possible hemochromatosis. Is this scheduled? MS

## 2022-08-16 NOTE — Telephone Encounter (Signed)
-----   Message from Alexis Frock, [place] sent at 08/15/2022  3:43 PM EDT ----- Regarding: Labs due Illene Bolus, CMA  Illene Bolus, CMA Please see phone note from 06/18/22. Patient needs repeat LFT's after 6/10 when she gets back from [place]. Put order in a call patient.  Left a message to return call. 08-15-2022

## 2022-08-16 NOTE — Telephone Encounter (Signed)
[phone] call placed to Wille Celeste to inquire about referral faxed on 5/3.  Left message on machine to call back

## 2022-08-17 ENCOUNTER — Other Ambulatory Visit (INDEPENDENT_AMBULATORY_CARE_PROVIDER_SITE_OTHER): Payer: Medicare Other

## 2022-08-17 DIAGNOSIS — R7989 Other specified abnormal findings of blood chemistry: Secondary | ICD-10-CM

## 2022-08-17 LAB — HEPATIC FUNCTION PANEL
ALT: 50 U/L — ABNORMAL HIGH (ref 0–35)
AST: 67 U/L — ABNORMAL HIGH (ref 0–37)
Albumin: 4 g/dL (ref 3.5–5.2)
Alkaline Phosphatase: 141 U/L — ABNORMAL HIGH (ref 39–117)
Bilirubin, Direct: 0.2 mg/dL (ref 0.0–0.3)
Total Bilirubin: 0.7 mg/dL (ref 0.2–1.2)
Total Protein: 7.1 g/dL (ref 6.0–8.3)

## 2022-08-23 ENCOUNTER — Encounter: Payer: Self-pay | Admitting: Gastroenterology

## 2022-08-23 ENCOUNTER — Ambulatory Visit (INDEPENDENT_AMBULATORY_CARE_PROVIDER_SITE_OTHER): Payer: Medicare Other | Admitting: Gastroenterology

## 2022-08-23 ENCOUNTER — Other Ambulatory Visit (INDEPENDENT_AMBULATORY_CARE_PROVIDER_SITE_OTHER): Payer: Medicare Other

## 2022-08-23 VITALS — BP 118/70 | HR 61 | Ht 61.25 in | Wt 147.0 lb

## 2022-08-23 DIAGNOSIS — R933 Abnormal findings on diagnostic imaging of other parts of digestive tract: Secondary | ICD-10-CM | POA: Diagnosis not present

## 2022-08-23 DIAGNOSIS — R197 Diarrhea, unspecified: Secondary | ICD-10-CM | POA: Diagnosis not present

## 2022-08-23 DIAGNOSIS — K52832 Lymphocytic colitis: Secondary | ICD-10-CM

## 2022-08-23 DIAGNOSIS — Z862 Personal history of diseases of the blood and blood-forming organs and certain disorders involving the immune mechanism: Secondary | ICD-10-CM | POA: Diagnosis not present

## 2022-08-23 DIAGNOSIS — R7989 Other specified abnormal findings of blood chemistry: Secondary | ICD-10-CM

## 2022-08-23 LAB — URINALYSIS, ROUTINE W REFLEX MICROSCOPIC
Ketones, ur: 15 — AB
Nitrite: POSITIVE — AB
Specific Gravity, Urine: >=1.03 — AB (ref 1.000–1.030)
Total Protein, Urine: 30 — AB
Urine Glucose: NEGATIVE
Urobilinogen, UA: 1 (ref 0.0–1.0)
pH: 6 (ref 5.0–8.0)

## 2022-08-23 LAB — CBC WITH DIFFERENTIAL/PLATELET
Basophils Absolute: 0.1 10*3/uL (ref 0.0–0.1)
Basophils Relative: 1.1 % (ref 0.0–3.0)
Eosinophils Absolute: 0.9 10*3/uL — ABNORMAL HIGH (ref 0.0–0.7)
Eosinophils Relative: 6.9 % — ABNORMAL HIGH (ref 0.0–5.0)
HCT: 38.5 % (ref 36.0–46.0)
Hemoglobin: 13 g/dL (ref 12.0–15.0)
Lymphocytes Relative: 12.5 % (ref 12.0–46.0)
Lymphs Abs: 1.6 10*3/uL (ref 0.7–4.0)
MCHC: 33.7 g/dL (ref 30.0–36.0)
MCV: 102.6 fl — ABNORMAL HIGH (ref 78.0–100.0)
Monocytes Absolute: 1.3 10*3/uL — ABNORMAL HIGH (ref 0.1–1.0)
Monocytes Relative: 10.3 % (ref 3.0–12.0)
Neutro Abs: 9 10*3/uL — ABNORMAL HIGH (ref 1.4–7.7)
Neutrophils Relative %: 69.2 % (ref 43.0–77.0)
Platelets: 388 10*3/uL (ref 150.0–400.0)
RBC: 3.75 Mil/uL — ABNORMAL LOW (ref 3.87–5.11)
RDW: 12.3 % (ref 11.5–15.5)
WBC: 13.1 10*3/uL — ABNORMAL HIGH (ref 4.0–10.5)

## 2022-08-23 LAB — COMPREHENSIVE METABOLIC PANEL
ALT: 34 U/L (ref 0–35)
AST: 38 U/L — ABNORMAL HIGH (ref 0–37)
Albumin: 4 g/dL (ref 3.5–5.2)
Alkaline Phosphatase: 149 U/L — ABNORMAL HIGH (ref 39–117)
BUN: 12 mg/dL (ref 6–23)
CO2: 26 mEq/L (ref 19–32)
Calcium: 9.2 mg/dL (ref 8.4–10.5)
Chloride: 99 mEq/L (ref 96–112)
Creatinine, Ser: 0.89 mg/dL (ref 0.40–1.20)
GFR: 67.99 mL/min (ref >60.00)
Glucose, Bld: 113 mg/dL — ABNORMAL HIGH (ref 70–99)
Potassium: 4.8 mEq/L (ref 3.5–5.1)
Sodium: 135 mEq/L (ref 135–145)
Total Bilirubin: 0.6 mg/dL (ref 0.2–1.2)
Total Protein: 7.4 g/dL (ref 6.0–8.3)

## 2022-08-23 LAB — TSH: TSH: 1.17 u[IU]/mL (ref 0.35–5.50)

## 2022-08-23 LAB — FERRITIN: Ferritin: 465.6 ng/mL — ABNORMAL HIGH (ref 10.0–291.0)

## 2022-08-23 NOTE — Progress Notes (Signed)
Assessment     Diarrhea, typically 2 loose stools in the morning.  Symptoms are not typical for lymphocytic colitis as there was no response to budesonide or Lialda.  R/O food intolerance, celiac disease, bacterial overgrowth Facial swelling, eyebrow and eyelashes loss.  These are atypical for a short course of budesonide which is poorly absorbed from the GI tract and rarely causes systemic effects. History of lymphocytic colitis Duodenal diverticulosis Mildly dilated CBD (9 mm) without associated pathology on MRCP Cholelithiasis Elevated LFTs. Suspected hemochromatosis: C282Y and H63D positive   Recommendations    Avoid NSAIDs Stop Lialda  Lactose free for 7 days then low FODMAP diet CMP, CBC, TSH, ferritin, tTG, IgA, UA Colonoscopy if diarrhea persists without a diagnosis established Return to PCP for further evaluation of facial swelling, abnormal urine, eyebrow and eyelash loss Hepatology referral REV in 6 weeks    HPI    This is a 67 year old female with diarrhea.  She relates 2-4 loose stools in the mornings, typically 2 stools, since April.  Symptoms often occur after breakfast and can be eliminated or minimized by skipping breakfast.  She started budesonide and immediately developed facial swelling. She relates loss of eyebrows and eyelashes after being on budesonide for about 3 weeks.  She tapered and discontinued budesonide after a total of 5 weeks.  Her diarrhea was unchanged.  She was started on Lialda which has had no impact on her diarrhea.  Stool studies in May were normal.  She relates having 2 upper respiratory tract infections 1 while taking budesonide and the one that developed on her trip home from [place] where she spent 3 weeks.  She relates cloudy urine for the past 2 days without dysuria.  No fevers, chills, rectal bleeding, abdominal pain, weight loss.  Colonoscopy Oct 2019 - One 7 mm polyp in the transverse colon, removed with a cold snare. Resected and  retrieved.  - Moderate diverticulosis in the sigmoid colon, in the descending colon and in the transverse colon.  - The examination was otherwise normal on direct and retroflexion views. Path: lymphocytic colitis  Labs / Imaging       Latest Ref Rng & Units 08/17/2022    3:29 PM 05/30/2022    3:26 PM 11/14/2021    3:31 PM  Hepatic Function  Total Protein 6.0 - 8.3 g/dL 7.1  8.1  7.8   Albumin 3.5 - 5.2 g/dL 4.0  4.6  4.3   AST 0 - 37 U/L 67  50  70   ALT 0 - 35 U/L 50  36  46   Alk Phosphatase 39 - 117 U/L 141  143  141   Total Bilirubin 0.2 - 1.2 mg/dL 0.7  0.6  0.8   Bilirubin, Direct 0.0 - 0.3 mg/dL 0.2          Latest Ref Rng & Units 05/30/2022    3:26 PM 11/14/2021    3:31 PM 09/21/2020   11:33 AM  CBC  WBC 4.0 - 10.5 K/uL 12.6  10.0  6.6   Hemoglobin 12.0 - 15.0 g/dL 82.9  56.2  13.0   Hematocrit 36.0 - 46.0 % 42.5  43.2  42.4   Platelets 150 - 400 K/uL 276  260.0  246.0      MR LIVER W WO CONTRAST CLINICAL DATA:  History of hereditary hemochromatosis with elevated liver function tests  EXAM: MRI ABDOMEN WITHOUT AND WITH CONTRAST  TECHNIQUE: Multiplanar multisequence MR imaging of the abdomen  was performed both before and after the administration of intravenous contrast.  CONTRAST:  6.28mL GADAVIST GADOBUTROL 1 MMOL/ML IV SOLN  COMPARISON:  MR abdomen dated 03/27/2022  FINDINGS: Lower chest: No acute findings.  Hepatobiliary: No mass or other parenchymal abnormality identified. Similar common bile duct dilation up to 9 mm to the level of the ampulla. No focal mass lesion or filling defect. Cholelithiasis.  Pancreas: No mass, inflammatory changes, or other parenchymal abnormality identified.  Spleen:  Within normal limits in size and appearance.  Adrenals/Urinary Tract: No adrenal nodules. No suspicious renal masses identified. No evidence of hydronephrosis. Thinly septated right upper pole cyst is unchanged, measuring 5.4 x 4.3 cm (4:12), remeasured.  Additional bilateral simple cysts. No specific follow-up imaging recommended.  Stomach/Bowel: Duodenal diverticulosis. Colonic diverticulosis without acute diverticulitis.  Vascular/Lymphatic: No pathologically enlarged lymph nodes identified. No abdominal aortic aneurysm demonstrated.  Other:  None.  Musculoskeletal: No suspicious bone lesions identified.  IMPRESSION: 1. Similar common bile duct dilation up to 9 mm to the level of the ampulla. No focal mass lesion or filling defect. 2. No hepatic parenchymal signal abnormality. Specifically, no MRI finding of iron deposition or steatosis. 3. Cholelithiasis. 4. Duodenal diverticulosis and colonic diverticulosis without acute diverticulitis.  Electronically Signed   By: Agustin Cree M.D.   On: 06/15/2022 11:46   Current Medications, Allergies, Past Medical History, Past Surgical History, Family History and Social History were reviewed in [employer] record.   Physical Exam: General: Well developed, well nourished, no acute distress Head: Normocephalic and atraumatic Eyes: Sclerae anicteric, EOMI Ears: Normal auditory acuity Mouth: No deformities or lesions noted Lungs: Clear throughout to auscultation Heart: Regular rate and rhythm; No murmurs, rubs or bruits Abdomen: Soft, non tender and non distended. No masses, hepatosplenomegaly or hernias noted. Normal Bowel sounds Rectal: Not done Musculoskeletal: Symmetrical with no gross deformities  Pulses:  Normal pulses noted Extremities: No edema or deformities noted Neurological: Alert oriented x 4, grossly nonfocal Psychological:  Alert and cooperative. Normal mood and affect   Inaara Tye T. Russella Dar, MD 08/23/2022, 11:02 AM

## 2022-08-23 NOTE — Patient Instructions (Addendum)
We are still working on your appt with [facility].   Your provider has requested that you go to the basement level for lab work before leaving today. Press "B" on the elevator. The lab is located at the first door on the left as you exit the elevator.  Try stopping Lialda.  Start a lactose free diet x 7 days. If no improvement in your symptoms then start a low fod-map diet.   The [facility] providers would like to encourage you to use [facility] to communicate with providers for non-urgent requests or questions.  Due to long hold times on the telephone, sending your provider a message by [facility] may be a faster and more efficient way to get a response.  Please allow 48 business hours for a response.  Please remember that this is for non-urgent requests.   Due to recent changes in healthcare laws, you may see the results of your imaging and laboratory studies on MyChart before your provider has had a chance to review them.  We understand that in some cases there may be results that are confusing or concerning to you. Not all laboratory results come back in the same time frame and the provider may be waiting for multiple results in order to interpret others.  Please give [place] 48 hours in order for your provider to thoroughly review all the results before contacting the office for clarification of your results.   Thank you for choosing me and [facility].  Venita Lick. Pleas Koch., MD., [facility]  Lactose-Free Diet, Adult If you have lactose intolerance, you are not able to digest lactose. Lactose is a natural sugar found mainly in dairy milk and dairy products. A lactose-free diet can help you avoid foods and beverages that contain lactose. What are tips for following this plan? Reading food labels Do not consume foods, beverages, vitamins, minerals, or medicines containing lactose. Read ingredient lists carefully. Look for the words "lactose-free" on labels. Meal planning Use  alternatives to dairy milk and foods made with milk products. These include the following: Lactose-free milk. Soy milk with added calcium and vitamin D. Almond milk, coconut milk, rice milk, or other nondairy milk alternatives with added calcium and vitamin D. Note that a lot of these are low in protein. Soy products, such as soy yogurt, soy cheese, soy ice cream, and soy-based sour cream. Other nut milk products, such as almond yogurt, almond cheese, cashew yogurt, cashew cheese, cashew ice cream, coconut yogurt, and coconut ice cream. Medicines, vitamins, and supplements Use lactase enzyme drops or tablets as directed by your health care provider. Make sure you get enough calcium and vitamin D in your diet. A lactose-free eating plan can be lacking in these important nutrients. Take calcium and vitamin D supplements as directed by your health care provider. Talk with your health care provider about supplements if you are not able to get enough calcium and vitamin D from food. What foods should I eat?  Fruits All fresh, canned, frozen, or dried fruits and fruit juices that are not processed with lactose. Vegetables All fresh, frozen, and canned vegetables without cheese, cream, or butter sauces. Grains Any that are not made with dairy milk or dairy products. Meats and other proteins Any meat, fish, poultry, and other protein sources that are not made with dairy milk or dairy products. Fats and oils Any that are not made with dairy milk or dairy products. Sweets and desserts Any that are not made with dairy milk  or dairy products. Seasonings and condiments Any that are not made with dairy milk or dairy products. Calcium Calcium is found in many foods that contain lactose and is important for bone health. The amount of calcium you need depends on your age: Adults younger than 50 years: 1,000 mg of calcium a day. Adults older than 50 years: 1,200 mg of calcium a day. If you are not  getting enough calcium, you may get it from other sources, including: Orange juice that has been fortified with calcium. This means that calcium has been added to the product. There are 300-350 mg of calcium in 1 cup (237 mL) of calcium-fortified orange juice. Soy milk fortified with calcium. There are 300-400 mg of calcium in 1 cup (237 mL) of calcium-fortified soy milk. Rice or almond milk fortified with calcium. There are 300 mg of calcium in 1 cup (237 mL) of calcium-fortified rice or almond milk. Breakfast cereals fortified with calcium. There are 100-1,000 mg of calcium in calcium-fortified breakfast cereals. Spinach, cooked. There are 145 mg of calcium in  cup (90 g) of cooked spinach. Edamame, cooked. There are 130 mg of calcium in  cup (47 g) of cooked edamame. Collard greens, cooked. There are 125 mg of calcium in  cup (85 g) of cooked collard greens. Kale, frozen or cooked. There are 90 mg of calcium in  cup (59 g) of cooked or frozen kale. Almonds. There are 95 mg of calcium in  cup (35 g) of almonds. Broccoli, cooked. There are 60 mg of calcium in 1 cup (156 g) of cooked broccoli. The items listed above may not be a complete list of foods and beverages you can eat. Contact a dietitian for more options. What foods should I avoid? Lactose is found in dairy milk and dairy products, such as: Yogurt. Cheese. Butter. Margarine. Sour cream. Cream. Whipped toppings and creamers. Ice cream and other dairy-based desserts. Lactose is also found in foods or products made with dairy milk or milk ingredients. To find out whether a food contains dairy milk or a milk ingredient, look at the ingredients list. Avoid foods with the statement "May contain milk" and foods that contain: Milk powder. Whey. Curd. Lactose. Lactoglobulin. The items listed above may not be a complete list of foods and beverages to avoid. Contact a dietitian for more information. Where to find more  information [employer] of Diabetes and Digestive and Kidney Diseases: [email or web address] Summary If you are lactose intolerant, it means that you are not able to digest lactose, a natural sugar found in milk and milk products. Following a lactose-free diet can help you manage this condition. Calcium is important for bone health and is found in many foods that contain lactose. Talk with your health care provider about other sources of calcium. This information is not intended to replace advice given to you by your health care provider. Make sure you discuss any questions you have with your health care provider. Document Revised: 01/26/2020 Document Reviewed: 01/26/2020 Elsevier Patient Education  2024 Elsevier Inc.     Low-FODMAP Eating Plan  FODMAP stands for fermentable oligosaccharides, disaccharides, monosaccharides, and polyols. These are sugars that are hard for some people to digest. A low-FODMAP eating plan may help some people who have irritable bowel syndrome (IBS) and certain other bowel (intestinal) diseases to manage their symptoms. This meal plan can be complicated to follow. Work with a diet and nutrition specialist (dietitian) to make a low-FODMAP eating plan that is right for you.  A dietitian can help make sure that you get enough nutrition from this diet. What are tips for following this plan? Reading food labels Check labels for hidden FODMAPs such as: High-fructose syrup. Honey. Agave. Natural fruit flavors. Onion or garlic powder. Choose low-FODMAP foods that contain 3-4 grams of fiber per serving. Check food labels for serving sizes. Eat only one serving at a time to make sure FODMAP levels stay low. Shopping Shop with a list of foods that are recommended on this diet and make a meal plan. Meal planning Follow a low-FODMAP eating plan for up to 6 weeks, or as told by your health care provider or dietitian. To follow the eating plan: Eliminate high-FODMAP  foods from your diet completely. Choose only low-FODMAP foods to eat. You will do this for 2-6 weeks. Gradually reintroduce high-FODMAP foods into your diet one at a time. Most people should wait a few days before introducing the next new high-FODMAP food into their meal plan. Your dietitian can recommend how quickly you may reintroduce foods. Keep a daily record of what and how much you eat and drink. Make note of any symptoms that you have after eating. Review your daily record with a dietitian regularly to identify which foods you can eat and which foods you should avoid. General tips Drink enough fluid each day to keep your urine pale yellow. Avoid processed foods. These often have added sugar and may be high in FODMAPs. Avoid most dairy products, whole grains, and sweeteners. Work with a dietitian to make sure you get enough fiber in your diet. Avoid high FODMAP foods at meals to manage symptoms. Recommended foods Fruits Bananas, oranges, tangerines, lemons, limes, blueberries, raspberries, strawberries, grapes, cantaloupe, honeydew melon, kiwi, papaya, passion fruit, and pineapple. Limited amounts of dried cranberries, banana chips, and shredded coconut. Vegetables Eggplant, zucchini, cucumber, peppers, green beans, bean sprouts, lettuce, arugula, kale, Swiss chard, spinach, collard greens, bok choy, summer squash, potato, and tomato. Limited amounts of corn, carrot, and sweet potato. Green parts of scallions. Grains Gluten-free grains, such as rice, oats, buckwheat, quinoa, corn, polenta, and millet. Gluten-free pasta, bread, or cereal. Rice noodles. Corn tortillas. Meats and other proteins Unseasoned beef, pork, poultry, or fish. Eggs. Tomasa Blase. Tofu (firm) and tempeh. Limited amounts of nuts and seeds, such as almonds, walnuts, [place] nuts, pecans, peanuts, nut butters, pumpkin seeds, chia seeds, and sunflower seeds. Dairy Lactose-free milk, yogurt, and kefir. Lactose-free cottage cheese  and ice cream. Non-dairy milks, such as almond, coconut, hemp, and rice milk. Non-dairy yogurt. Limited amounts of goat cheese, brie, mozzarella, parmesan, swiss, and other hard cheeses. Fats and oils Butter-free spreads. Vegetable oils, such as olive, canola, and sunflower oil. Seasoning and other foods Artificial sweeteners with names that do not end in "ol," such as aspartame, saccharine, and stevia. Maple syrup, white table sugar, raw sugar, brown sugar, and molasses. Mayonnaise, soy sauce, and tamari. Fresh basil, coriander, parsley, rosemary, and thyme. Beverages Water and mineral water. Sugar-sweetened soft drinks. Small amounts of orange juice or cranberry juice. Black and green tea. Most dry wines. Coffee. The items listed above may not be a complete list of foods and beverages you can eat. Contact a dietitian for more information. Foods to avoid Fruits Fresh, dried, and juiced forms of apple, pear, watermelon, peach, plum, cherries, apricots, blackberries, boysenberries, figs, nectarines, and mango. Avocado. Vegetables Chicory root, artichoke, asparagus, cabbage, snow peas, Brussels sprouts, broccoli, sugar snap peas, mushrooms, celery, and cauliflower. Onions, garlic, leeks, and the white part of  scallions. Grains Wheat, including kamut, durum, and semolina. Barley and bulgur. Couscous. Wheat-based cereals. Wheat noodles, bread, crackers, and pastries. Meats and other proteins Fried or fatty meat. Sausage. Cashews and pistachios. Soybeans, baked beans, black beans, chickpeas, kidney beans, fava beans, navy beans, lentils, black-eyed peas, and split peas. Dairy Milk, yogurt, ice cream, and soft cheese. Cream and sour cream. Milk-based sauces. Custard. Buttermilk. Soy milk. Seasoning and other foods Any sugar-free gum or candy. Foods that contain artificial sweeteners such as sorbitol, mannitol, isomalt, or xylitol. Foods that contain honey, high-fructose corn syrup, or agave. Bouillon,  vegetable stock, beef stock, and chicken stock. Garlic and onion powder. Condiments made with onion, such as hummus, chutney, pickles, relish, salad dressing, and salsa. Tomato paste. Beverages Chicory-based drinks. Coffee substitutes. Chamomile tea. Fennel tea. Sweet or fortified wines such as port or sherry. Diet soft drinks made with isomalt, mannitol, maltitol, sorbitol, or xylitol. Apple, pear, and mango juice. Juices with high-fructose corn syrup. The items listed above may not be a complete list of foods and beverages you should avoid. Contact a dietitian for more information. Summary FODMAP stands for fermentable oligosaccharides, disaccharides, monosaccharides, and polyols. These are sugars that are hard for some people to digest. A low-FODMAP eating plan is a short-term diet that helps to ease symptoms of certain bowel diseases. The eating plan usually lasts up to 6 weeks. After that, high-FODMAP foods are reintroduced gradually and one at a time. This can help you find out which foods may be causing symptoms. A low-FODMAP eating plan can be complicated. It is best to work with a dietitian who has experience with this type of plan. This information is not intended to replace advice given to you by your health care provider. Make sure you discuss any questions you have with your health care provider. Document Revised: 07/09/2019 Document Reviewed: 07/09/2019 Elsevier Patient Education  2024 [employer].

## 2022-08-24 LAB — TISSUE TRANSGLUTAMINASE, IGA: (tTG) Ab, IgA: <1 U/mL

## 2022-08-24 LAB — IGA: Immunoglobulin A: 182 mg/dL (ref 70–320)

## 2022-08-24 NOTE — Telephone Encounter (Signed)
Schedulers number for [facility] [phone]

## 2022-08-29 ENCOUNTER — Ambulatory Visit: Payer: Medicare Other | Admitting: Gastroenterology

## 2022-08-30 NOTE — Telephone Encounter (Signed)
FYI The pt has an appt with Dr Brooke Dare on 10/10/22 in [place].

## 2022-08-30 NOTE — Telephone Encounter (Signed)
Good news. Thanks!

## 2022-09-25 ENCOUNTER — Other Ambulatory Visit: Payer: Self-pay | Admitting: Family Medicine

## 2022-09-25 DIAGNOSIS — Z0000 Encounter for general adult medical examination without abnormal findings: Secondary | ICD-10-CM

## 2022-10-13 ENCOUNTER — Other Ambulatory Visit: Payer: Self-pay | Admitting: Gastroenterology

## 2022-10-13 IMAGING — MG MM DIGITAL SCREENING BILAT W/ TOMO AND CAD
8 series · 8 of 24 positions shown · non-contrast
Comparison: Previous exam(s).

CLINICAL DATA: Screening.

EXAM:
DIGITAL SCREENING BILATERAL MAMMOGRAM WITH TOMOSYNTHESIS AND CAD
TECHNIQUE: Bilateral screening digital craniocaudal and mediolateral oblique
mammograms were obtained. Bilateral screening digital breast
tomosynthesis was performed. The images were evaluated with
computer-aided detection.

[L CC synth-2D]
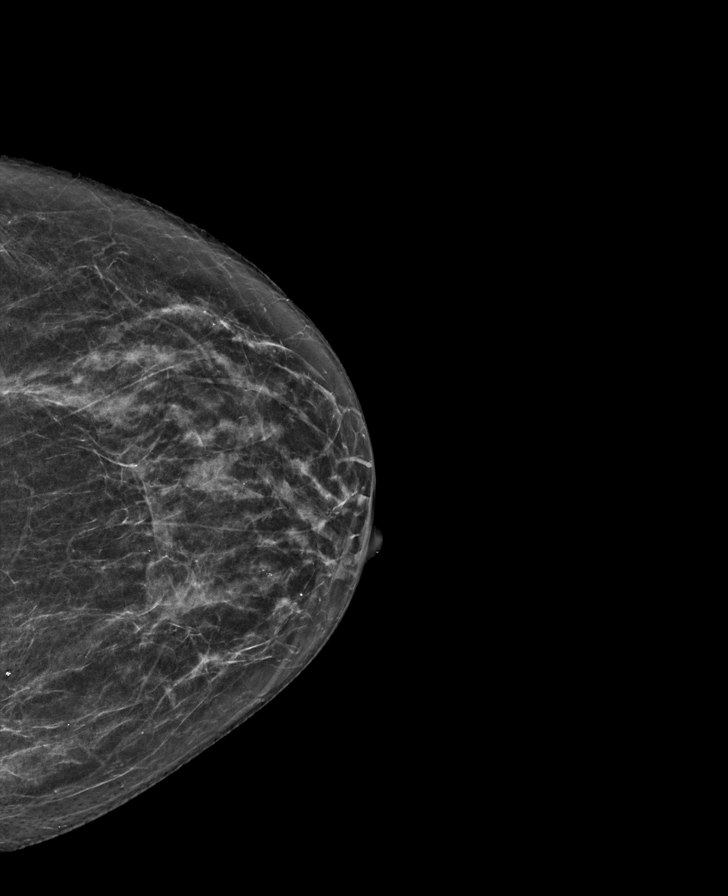

[R CC synth-2D]
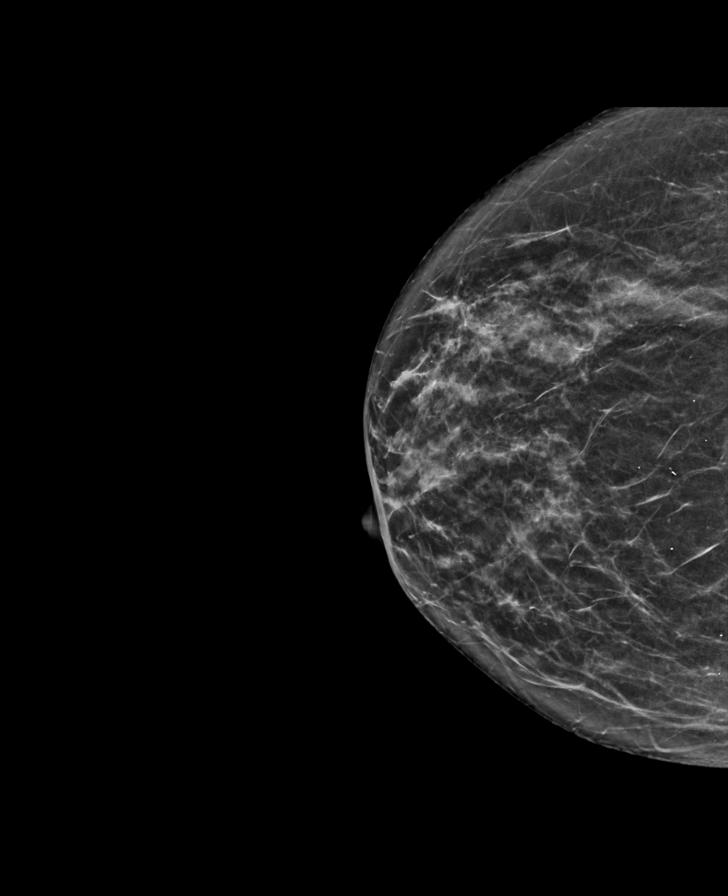

[L MLO synth-2D]
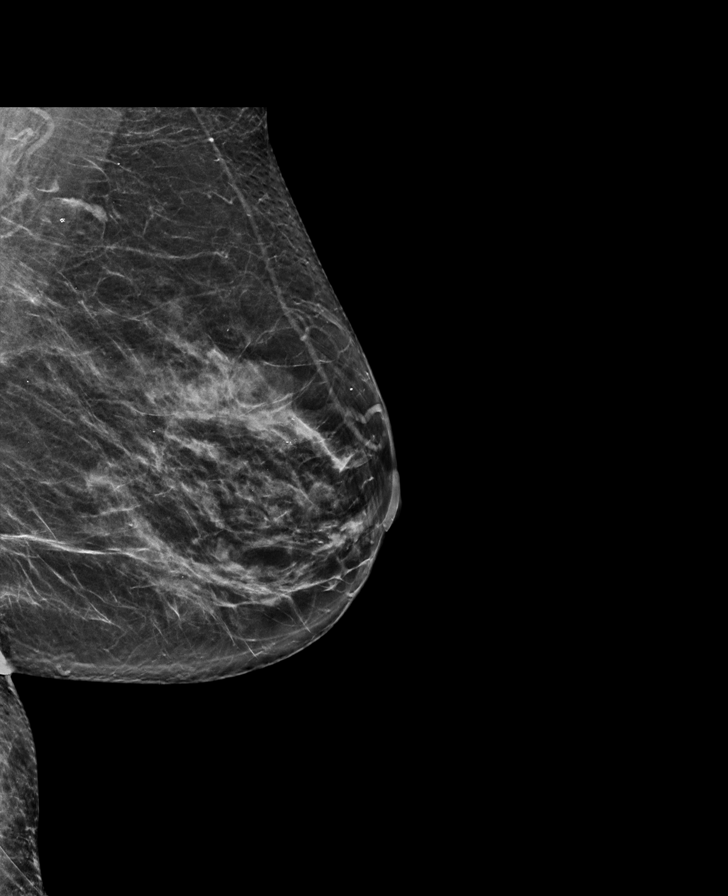

[R MLO synth-2D]
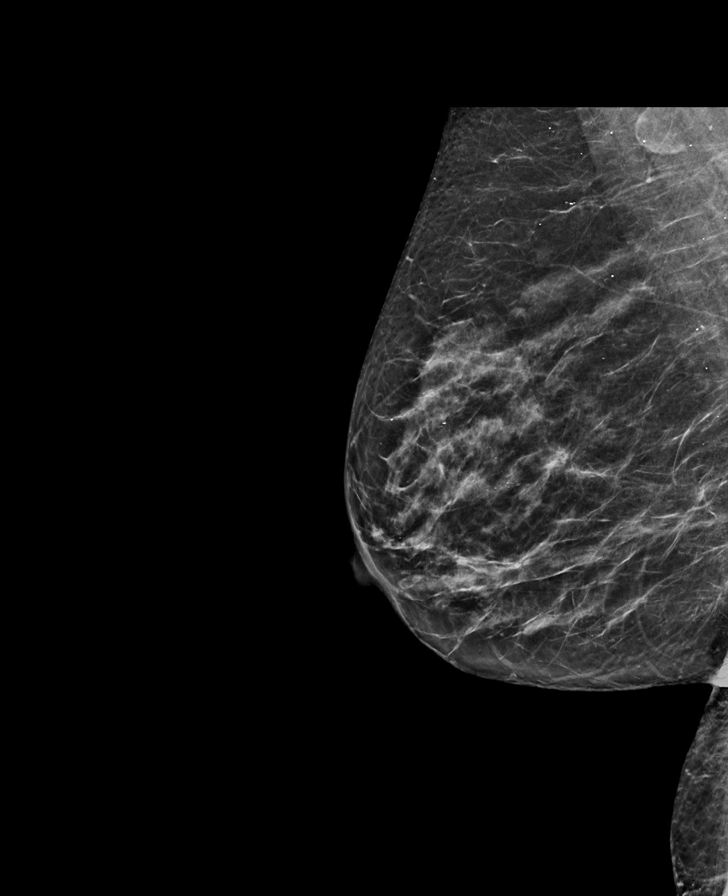

[L MLO tomo · tomo slice 33/65.0]
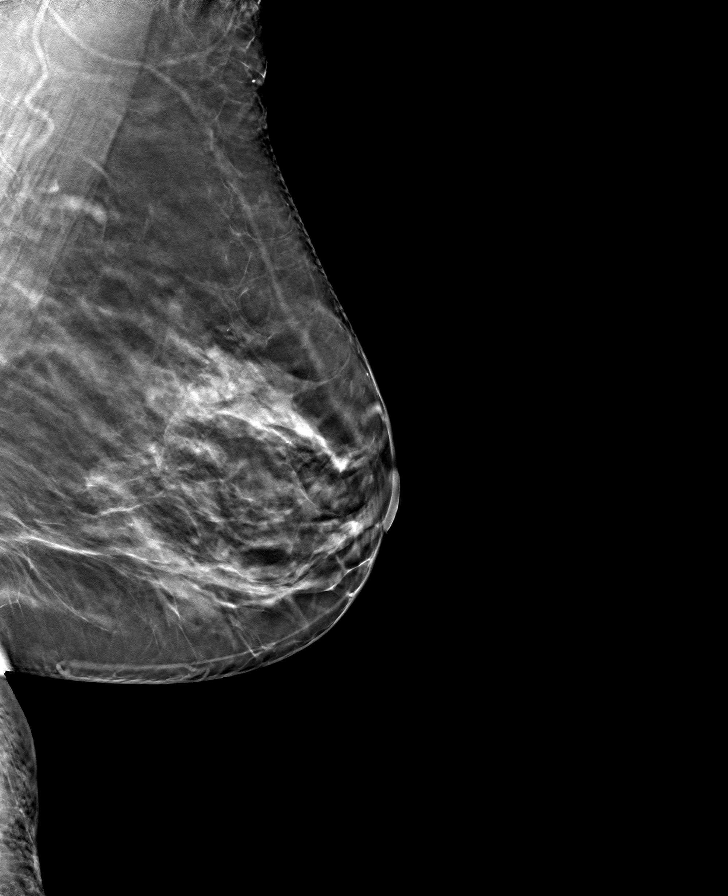

[L CC tomo · tomo slice 29/56.0]
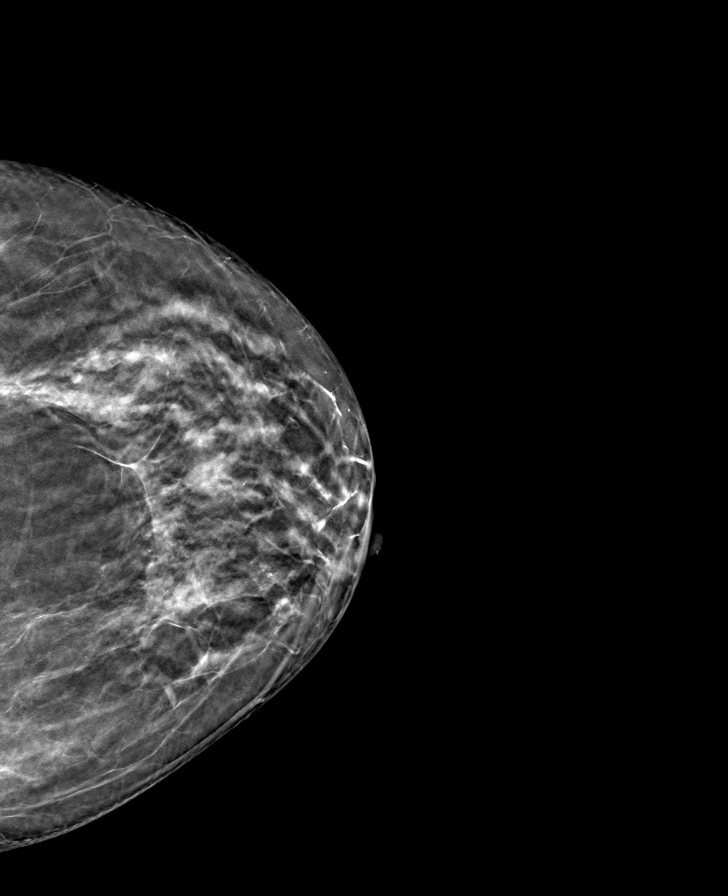

[R CC tomo · tomo slice 31/60.0]
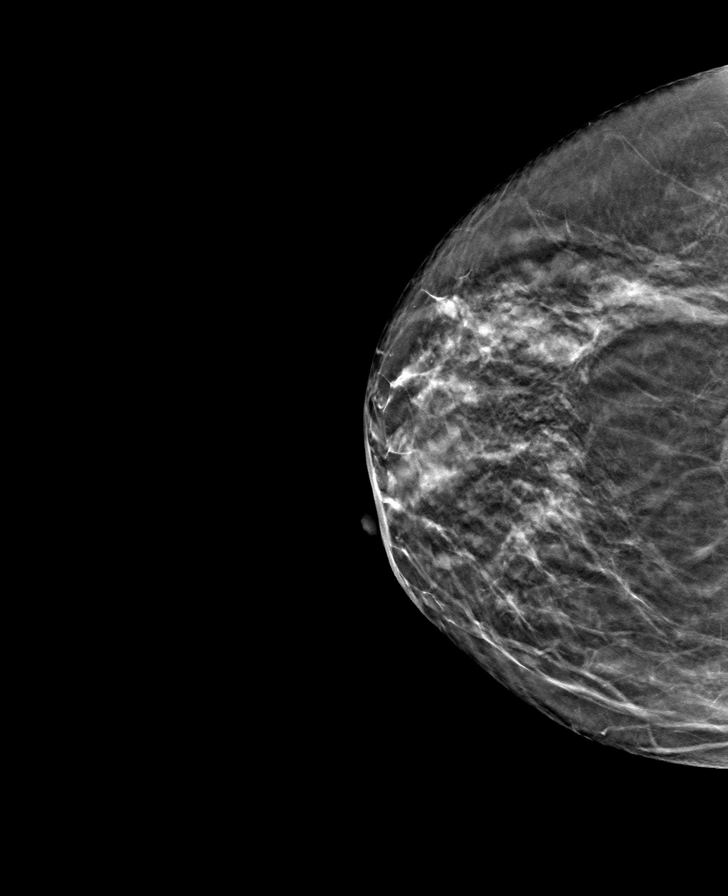

[R MLO tomo · tomo slice 33/66.0]
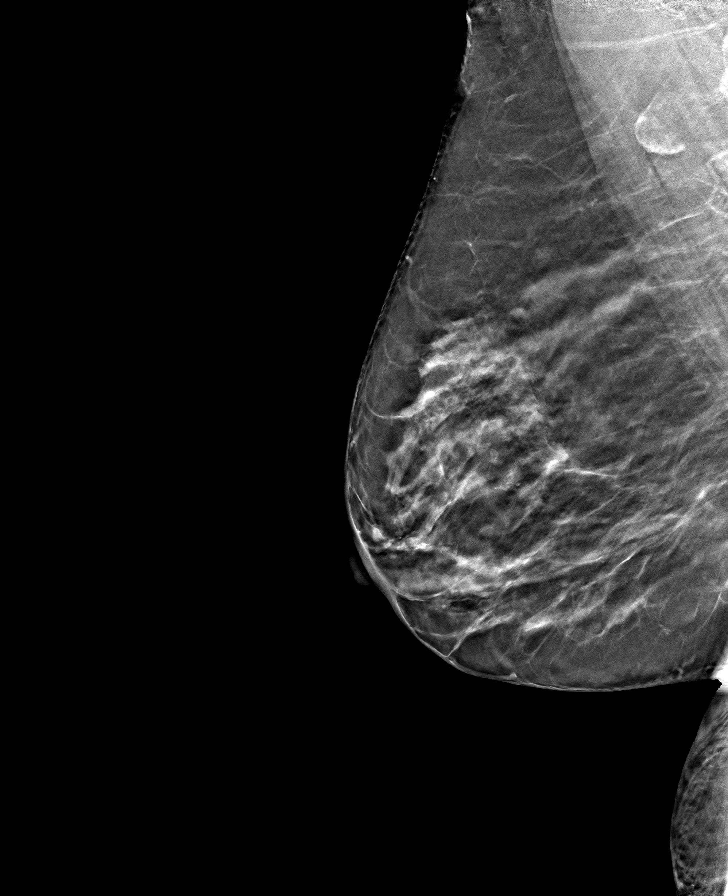

[8 of 24 positions shown; findings below may reference images not displayed]

ACR Breast Density Category b: There are scattered areas of
fibroglandular density.
FINDINGS: In the right breast, calcifications require further evaluation.

In the left breast, calcifications require further evaluation.
IMPRESSION: Further evaluation is suggested for calcifications in the right
breast.

Further evaluation is suggested for calcifications in the left
breast.

RECOMMENDATION:
Diagnostic mammogram of both breasts. (Code:[id number])

The patient will be contacted regarding the findings, and additional
imaging will be scheduled.

BI-RADS CATEGORY  0: Incomplete. Need additional imaging evaluation
and/or prior mammograms for comparison.

## 2022-11-06 ENCOUNTER — Ambulatory Visit: Payer: Medicare Other

## 2022-11-08 ENCOUNTER — Ambulatory Visit: Payer: Medicare Other | Admitting: Gastroenterology

## 2022-12-04 ENCOUNTER — Ambulatory Visit
Admission: RE | Admit: 2022-12-04 | Discharge: 2022-12-04 | Disposition: A | Discharge status: Home / Self Care | Payer: Medicare Other | Source: Ambulatory Visit | Attending: Family Medicine

## 2022-12-04 DIAGNOSIS — Z0000 Encounter for general adult medical examination without abnormal findings: Secondary | ICD-10-CM

## 2022-12-05 ENCOUNTER — Encounter: Payer: Self-pay | Admitting: Podiatry

## 2022-12-05 ENCOUNTER — Ambulatory Visit (INDEPENDENT_AMBULATORY_CARE_PROVIDER_SITE_OTHER): Payer: Medicare Other | Admitting: Podiatry

## 2022-12-05 DIAGNOSIS — L600 Ingrowing nail: Secondary | ICD-10-CM

## 2022-12-05 NOTE — Progress Notes (Signed)
   Chief Complaint  Patient presents with   Nail Problem    Left Hallux nail pain medial border. Nail hasn't really grown out.     Subjective: Patient presents today for evaluation of pain to the medial border of the left great toe. Patient is concerned for possible ingrown nail.  It is very sensitive to touch.  It has been present like this for several months.  Patient presents today for further treatment and evaluation.  Past Medical History:  Diagnosis Date   Abnormal exercise tolerance test 11/24/2012   Did not reach 85% maximum heart rate. No ischemic changes noted at 79%. Notably beta blocker was not held; no arrhythmias noted.   Episcleritis of right eye    H/O echocardiogram OS 20/40   EF 60-65% with mild concentric LVH. Normal wall motion. Grade 1 diastolic dysfunction. Trace aortic regurgitation. No clear-cut evidence of murmur source   Hot flashes    Hypertension    Lymphocytic colitis    PAF (paroxysmal atrial fibrillation) [facility]) July 2014   Paroxysmal atrial flutter [facility]) August 2014   Paroxysmal SVT (supraventricular tachycardia) (HCC) 10/06/2012   This may have simply been misdiagnosed A. fib   Scleritis and episcleritis of right eye 08/28/2012    Past Surgical History:  Procedure Laterality Date   BREAST BIOPSY Left 10/2020   CESAREAN SECTION     VARICOSE VEIN SURGERY  1990   WRIST SURGERY Left     No Known Allergies  Objective:  General: Well developed, nourished, in no acute distress, alert and oriented x3   Dermatology: Skin is warm, dry and supple bilateral.  Medial border left great toe is tender with evidence of an ingrowing nail. Pain on palpation noted to the border of the nail fold. The remaining nails appear unremarkable at this time.   Vascular: DP and PT pulses palpable.  No clinical evidence of vascular compromise  Neruologic: Grossly intact via light touch bilateral.  Musculoskeletal: No pedal deformity noted  Assesement: #1 Paronychia with  ingrowing nail medial border left great toe  Plan of Care:  -Patient evaluated.  -Discussed treatment alternatives and plan of care. Explained nail avulsion procedure versus a more conservative nail debridement of the offending border of the nail plate.  For now we are going to pursue a more conservative nail debridement to see if debriding the nail and the offending border of the nail plate will alleviate her symptoms -The ingrowing portion of the toenail was debrided and the patient felt almost instant relief. -OTC Tolcylen antifungal topical dispensed at checkout -Return to clinic as needed   Felecia Shelling, DPM [facility]  Dr. Felecia Shelling, DPM    [address] [place], [place] [address]                Office [phone]  Fax [phone]

## 2022-12-13 ENCOUNTER — Other Ambulatory Visit: Payer: Self-pay | Admitting: Family Medicine

## 2022-12-13 DIAGNOSIS — E2839 Other primary ovarian failure: Secondary | ICD-10-CM

## 2022-12-16 ENCOUNTER — Other Ambulatory Visit: Payer: Self-pay | Admitting: Hematology and Oncology

## 2022-12-16 NOTE — Progress Notes (Unsigned)
[facility] [phone]   [phone]  PROGRESS NOTE  Patient Care Team: Shon Hale, MD as PCP - General (Family Medicine) Lyn Records, MD (Inactive) as PCP - Cardiology (Cardiology)  Hematological/Oncological History # Hereditary Hemochromatosis 7/20/20223: Elevated LFTs with AST 68, ALT 43, Alk Phos 126.  11/14/2021: Elevated LFTs with AST 70, ALT 46, Alk Phos 141 12/27/2021: Abdominal US showed common bile duct is abnormally dilated to 10.8 mm. Sludge and stones in the gallbladder. Diffuse increased echogenicity through the liver is nonspecific. 03/14/2022: Established care with Dr. Russella Dar at [facility]. 03/24/2022: Iron 196 (H), Saturation 54.9% (H), Ferritin 532.1 (H) 03/27/2022: MRCP: Cholelithiasis, mild biliary ductal dilatation without evidence of choledocholithiasis or stricture, mild hepatic steatosis. 04/13/2022: Hemochromatosis DNA testing showed homozygous mutation of H63D gene.  05/30/2022: Establish care with [facility]   Interval History:  Veronica Valencia 67 y.o. female with medical history significant for homozygous H63D hereditary hemochromatosis who presents for a follow up visit. The patient's last visit was on 05/30/2022 at which time she established care. In the interim since the last visit she has connected with the [facility] liver team and underwent an ERCP on 12/07/2022.  Additionally she had an MRI of the liver in April 2024 which showed no evidence of iron deposition.  On exam today Veronica Valencia reports she has been well overall in the interim since her last visit.  She reports the ERCP went well though she did have some nausea it was not followed with any vomiting or diarrhea.  She reports that they were intending to get a biopsy of her liver but the ERCP took priority.  She reports that she has not had any recent infectious symptoms such as runny nose, sore throat, or cough.  Her appetite is good and her weight is steady.   She reports that otherwise she has had no fevers, chills, sweats.  A full 10 point ROS is otherwise negative.  MEDICAL HISTORY:  Past Medical History:  Diagnosis Date   Abnormal exercise tolerance test 11/24/2012   Did not reach 85% maximum heart rate. No ischemic changes noted at 79%. Notably beta blocker was not held; no arrhythmias noted.   Episcleritis of right eye    H/O echocardiogram OS 20/40   EF 60-65% with mild concentric LVH. Normal wall motion. Grade 1 diastolic dysfunction. Trace aortic regurgitation. No clear-cut evidence of murmur source   Hot flashes    Hypertension    Lymphocytic colitis    PAF (paroxysmal atrial fibrillation) [facility]) July 2014   Paroxysmal atrial flutter [facility]) August 2014   Paroxysmal SVT (supraventricular tachycardia) (HCC) 10/06/2012   This may have simply been misdiagnosed A. fib   Scleritis and episcleritis of right eye 08/28/2012    SURGICAL HISTORY: Past Surgical History:  Procedure Laterality Date   BREAST BIOPSY Left 10/2020   CESAREAN SECTION     VARICOSE VEIN SURGERY  1990   WRIST SURGERY Left     SOCIAL HISTORY: Social History   Socioeconomic History   Marital status: Married    Spouse name: Not on file   Number of children: 3   Years of education: Not on file   Highest education level: Professional school degree (e.g., MD, DDS, DVM, JD)  Occupational History   Occupation: Associate Education officer, environmental    Comment: [employer], [place]   Occupation: minister  Tobacco Use   Smoking status: Never   Smokeless tobacco: Never  Building services engineer  status: Never Used  Substance and Sexual Activity   Alcohol use: Yes    Comment: 2 glasses wine daily   Drug use: No   Sexual activity: Not on file  Other Topics Concern   Not on file  Social History Narrative   She is a married mother of 3.    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   Social Determinants of Health   Financial Resource Strain: Not on file  Food Insecurity: No Food Insecurity (05/30/2022)   Hunger Vital Sign    Worried About Running Out of Food in the Last Year: Never true    Ran Out of Food in the Last Year: Never true  Transportation Needs: No Transportation Needs (05/30/2022)   PRAPARE - Administrator, Civil Service (Medical): No    Lack of Transportation (Non-Medical): No  Physical Activity: Not on file  Stress: Not on file  Social Connections: Not on file  Intimate Partner Violence: Not At Risk (05/30/2022)   Humiliation, Afraid, Rape, and Kick questionnaire    Fear of Current or Ex-Partner: No    Emotionally Abused: No    Physically Abused: No    Sexually Abused: No    FAMILY HISTORY: Family History  Problem Relation Age of Onset   Diabetes Mother    Hypertension Mother    Stroke Father    Hypertension Father    Heart attack Father 75   Cancer Sister 87       breast   Breast cancer Sister    Ulcerative colitis Son    Healthy Maternal Grandmother    Alcohol abuse Maternal Grandfather    Colon cancer Neg Hx    Esophageal cancer Neg Hx    Rectal cancer Neg Hx    Stomach cancer Neg Hx     ALLERGIES:  has No Known Allergies.  MEDICATIONS:  Current Outpatient Medications  Medication Sig Dispense Refill   Biotin 5000 MCG CAPS Take 1 capsule by mouth daily. (Patient not taking: Reported on 08/23/2022)     diltiazem (CARDIZEM CD) 180 MG 24 hr capsule Take 1 capsule (180 mg total) by mouth daily. 90 capsule 3   flecainide (TAMBOCOR) 100 MG tablet Take 1 tablet (100 mg total) by mouth 2 (two) times daily. 180 tablet 3   losartan (COZAAR) 25 MG tablet Take 25 mg by mouth daily.     mesalamine (LIALDA) 1.2 g EC tablet TAKE 2 TABLETS BY MOUTH DAILY WITH BREAKFAST. 180 tablet 0    Multiple Vitamin (MULTIVITAMIN) tablet Take 1 tablet by mouth daily.     Omega 3 1000 MG CAPS Take 1 capsule by mouth daily.     No current facility-administered medications for this visit.    REVIEW OF SYSTEMS:   Constitutional: ( - ) fevers, ( - )  chills , ( - ) night sweats Eyes: ( - ) blurriness of vision, ( - ) double vision, ( - ) watery eyes Ears, nose, mouth, throat, and face: ( - ) mucositis, ( - ) sore throat Respiratory: ( - ) cough, ( - ) dyspnea, ( - ) wheezes Cardiovascular: ( - ) palpitation, ( - ) chest discomfort, ( - ) lower extremity swelling Gastrointestinal:  ( - )  nausea, ( - ) heartburn, ( - ) change in bowel habits Skin: ( - ) abnormal skin rashes Lymphatics: ( - ) new lymphadenopathy, ( - ) easy bruising Neurological: ( - ) numbness, ( - ) tingling, ( - ) new weaknesses Behavioral/Psych: ( - ) mood change, ( - ) new changes  All other systems were reviewed with the patient and are negative.  PHYSICAL EXAMINATION: Vitals:   12/17/22 1514  BP: (!) 155/78  Pulse: (!) 58  Resp: 16  Temp: 98.7 F (37.1 C)  SpO2: 98%   Filed Weights   12/17/22 1514  Weight: 151 lb 4.8 oz (68.6 kg)    GENERAL: Well-appearing middle-aged Caucasian female, alert, no distress and comfortable SKIN: skin color, texture, turgor are normal, no rashes or significant lesions EYES: conjunctiva are pink and non-injected, sclera clear LUNGS: clear to auscultation and percussion with normal breathing effort HEART: regular rate & rhythm and no murmurs and no lower extremity edema Musculoskeletal: no cyanosis of digits and no clubbing  PSYCH: alert & oriented x 3, fluent speech NEURO: no focal motor/sensory deficits  LABORATORY DATA:  I have reviewed the data as listed    Latest Ref Rng & Units 12/17/2022    2:50 PM 08/23/2022   11:53 AM 05/30/2022    3:26 PM  CBC  WBC 4.0 - 10.5 K/uL 11.7  13.1  12.6   Hemoglobin 12.0 - 15.0 g/dL 08.6  57.8  46.9   Hematocrit 36.0 - 46.0 %  39.3  38.5  42.5   Platelets 150 - 400 K/uL 323  388.0  276        Latest Ref Rng & Units 12/17/2022    2:50 PM 08/23/2022   11:53 AM 08/17/2022    3:29 PM  CMP  Glucose 70 - 99 mg/dL 629  528    BUN 8 - 23 mg/dL 12  12    Creatinine 4.13 - 1.00 mg/dL 2.44  0.10    Sodium 272 - 145 mmol/L 134  135    Potassium 3.5 - 5.1 mmol/L 4.9  4.8    Chloride 98 - 111 mmol/L 98  99    CO2 22 - 32 mmol/L 28  26    Calcium 8.9 - 10.3 mg/dL 9.5  9.2    Total Protein 6.5 - 8.1 g/dL 7.3  7.4  7.1   Total Bilirubin 0.3 - 1.2 mg/dL 0.8  0.6  0.7   Alkaline Phos 38 - 126 U/L 184  149  141   AST 15 - 41 U/L 57  38  67   ALT 0 - 44 U/L 33  34  50     RADIOGRAPHIC STUDIES:  MM 3D SCREENING MAMMOGRAM BILATERAL BREAST  Result Date: 12/06/2022 CLINICAL DATA:  Screening. EXAM: DIGITAL SCREENING BILATERAL MAMMOGRAM WITH TOMOSYNTHESIS AND CAD TECHNIQUE: Bilateral screening digital craniocaudal and mediolateral oblique mammograms were obtained. Bilateral screening digital breast tomosynthesis was performed. The images were evaluated with computer-aided detection. COMPARISON:  Previous exam(s). ACR Breast Density Category c: The breasts are heterogeneously dense, which may obscure small masses. FINDINGS: There are no findings suspicious for malignancy. IMPRESSION: No mammographic evidence of malignancy. A result letter of this screening mammogram will be mailed directly to the patient. RECOMMENDATION: Screening mammogram in one year. (Code:SM-B-01Y) BI-RADS CATEGORY  1: Negative. Electronically Signed   By: Norva Pavlov M.D.   On: 12/06/2022 10:08    ASSESSMENT & PLAN NITHYA MERIWEATHER is a 67 y.o. female who presents  to the clinic for follow up of homozygous H63D hereditary hemochromatosis.   Hereditary hemochromatosis is a hereditary condition caused by mutations in the HFE gene, which regulates iron absorption. The most common genes mutated in this condition are the C282Y and H63D genes. Since Ms. Montone has  the homozygous mutation involving the H63D gene, therapeutic phlebotomy would be indicated if there is evidence of organ involvement.  The patient underwent MRI of the liver in April 2024 which showed no evidence of increased iron deposition.  Therefore I do not believe phlebotomy is required at this time.   #Hereditary hemochromatosis, homozygous mutation of H63D gene: --Confirmed on hemochromatosis genetic testing on 04/13/2022.  --Underwent MRI in April 2024 to assess for iron deposition.  No evidence of iron deposition.  Biopsy would still be definitive but will defer to gastroenterology for further evaluation --If liver biopsy does show evidence of iron deposition of disease we will bring patient back for start of phlebotomies. --Discuss to consume iron rich foods and alcohol in moderation.  --Labs today show white blood cell 11.7, hemoglobin 13.8, MCV 99, and platelets of 323.  LFTs show AST of 57, ALT 33, and bilirubin 0.8. --RTC in 1 years time to re-evaluate.   No orders of the defined types were placed in this encounter.   All questions were answered. The patient knows to call the clinic with any problems, questions or concerns.  A total of more than 30 minutes were spent on this encounter with face-to-face time and non-face-to-face time, including preparing to see the patient, ordering tests and/or medications, counseling the patient and coordination of care as outlined above.   Ulysees Barns, MD Department of Hematology/Oncology [facility] Phone: [phone] Pager: [phone] Email: [email or web address]  12/17/2022 4:43 PM

## 2022-12-17 ENCOUNTER — Inpatient Hospital Stay: Payer: Medicare Other

## 2022-12-17 ENCOUNTER — Inpatient Hospital Stay: Payer: Medicare Other | Attending: Physician Assistant | Admitting: Hematology and Oncology

## 2022-12-17 DIAGNOSIS — Z803 Family history of malignant neoplasm of breast: Secondary | ICD-10-CM | POA: Diagnosis not present

## 2022-12-17 LAB — CBC WITH DIFFERENTIAL (CANCER CENTER ONLY)
Abs Immature Granulocytes: 0.06 10*3/uL (ref 0.00–0.07)
Basophils Absolute: 0.1 10*3/uL (ref 0.0–0.1)
Basophils Relative: 1 %
Eosinophils Absolute: 0.1 10*3/uL (ref 0.0–0.5)
Eosinophils Relative: 1 %
HCT: 39.3 % (ref 36.0–46.0)
Hemoglobin: 13.8 g/dL (ref 12.0–15.0)
Immature Granulocytes: 1 %
Lymphocytes Relative: 12 %
Lymphs Abs: 1.4 10*3/uL (ref 0.7–4.0)
MCH: 34.8 pg — ABNORMAL HIGH (ref 26.0–34.0)
MCHC: 35.1 g/dL (ref 30.0–36.0)
MCV: 99 fL (ref 80.0–100.0)
Monocytes Absolute: 1.1 10*3/uL — ABNORMAL HIGH (ref 0.1–1.0)
Monocytes Relative: 10 %
Neutro Abs: 9 10*3/uL — ABNORMAL HIGH (ref 1.7–7.7)
Neutrophils Relative %: 75 %
Platelet Count: 323 10*3/uL (ref 150–400)
RBC: 3.97 MIL/uL (ref 3.87–5.11)
RDW: 11.6 % (ref 11.5–15.5)
WBC Count: 11.7 10*3/uL — ABNORMAL HIGH (ref 4.0–10.5)
nRBC: 0 % (ref 0.0–0.2)

## 2022-12-17 LAB — IRON AND IRON BINDING CAPACITY (CC-WL,HP ONLY)
Iron: 212 ug/dL — ABNORMAL HIGH (ref 28–170)
Saturation Ratios: 66 % — ABNORMAL HIGH (ref 10.4–31.8)
TIBC: 323 ug/dL (ref 250–450)
UIBC: 111 ug/dL — ABNORMAL LOW (ref 148–442)

## 2022-12-17 LAB — CMP (CANCER CENTER ONLY)
ALT: 33 U/L (ref 0–44)
AST: 57 U/L — ABNORMAL HIGH (ref 15–41)
Albumin: 4 g/dL (ref 3.5–5.0)
Alkaline Phosphatase: 184 U/L — ABNORMAL HIGH (ref 38–126)
Anion gap: 8 (ref 5–15)
BUN: 12 mg/dL (ref 8–23)
CO2: 28 mmol/L (ref 22–32)
Calcium: 9.5 mg/dL (ref 8.9–10.3)
Chloride: 98 mmol/L (ref 98–111)
Creatinine: 1.07 mg/dL — ABNORMAL HIGH (ref 0.44–1.00)
GFR, Estimated: 58 mL/min — ABNORMAL LOW (ref >60)
Glucose, Bld: 109 mg/dL — ABNORMAL HIGH (ref 70–99)
Potassium: 4.9 mmol/L (ref 3.5–5.1)
Sodium: 134 mmol/L — ABNORMAL LOW (ref 135–145)
Total Bilirubin: 0.8 mg/dL (ref 0.3–1.2)
Total Protein: 7.3 g/dL (ref 6.5–8.1)

## 2022-12-17 LAB — FERRITIN: Ferritin: 477 ng/mL — ABNORMAL HIGH (ref 11–307)

## 2022-12-17 LAB — LACTATE DEHYDROGENASE: LDH: 138 U/L (ref 98–192)

## 2022-12-18 ENCOUNTER — Ambulatory Visit: Payer: Medicare Other | Admitting: Hematology and Oncology

## 2022-12-18 ENCOUNTER — Other Ambulatory Visit: Payer: Medicare Other

## 2022-12-18 ENCOUNTER — Ambulatory Visit: Payer: Medicare Other | Admitting: Physician Assistant

## 2023-01-02 ENCOUNTER — Encounter: Payer: Self-pay | Admitting: Gastroenterology

## 2023-01-02 ENCOUNTER — Ambulatory Visit (INDEPENDENT_AMBULATORY_CARE_PROVIDER_SITE_OTHER): Payer: Medicare Other | Admitting: Gastroenterology

## 2023-01-02 VITALS — BP 136/80 | HR 56 | Ht 61.25 in | Wt 151.0 lb

## 2023-01-02 DIAGNOSIS — R7989 Other specified abnormal findings of blood chemistry: Secondary | ICD-10-CM

## 2023-01-02 DIAGNOSIS — R197 Diarrhea, unspecified: Secondary | ICD-10-CM

## 2023-01-02 NOTE — Progress Notes (Signed)
Assessment     Mild postprandial diarrhea in the morning - symptoms not typical for active lymphocytic colitis, pancreatic insufficiency Cholelithiasis, common bile duct sludge - S/P EUS and ERCP at [facility] C282Y and H63D positive without iron hepatic deposition on MRI Elevated LFTs, likely secondary to #2 EUS suggested chronic pancreatitis   Recommendations    Avoid NSAIDs, Imodium 1-2 mg po tid prn diarrhea.  If diarrhea worsens consider repeat colonoscopy vs treating for lymphocytic colitis Cholecystectomy planned at [facility] Repeat LFT ~ 1 month after cholecystectomy Duodenal diverticulosis Screening colonoscopy recommended in October 2029 REV in 3 months with Dr. Doy Hutching   HPI    This is a 67year old female returning for follow-up of diarrhea.  She relates having diarrhea generally in the mornings after breakfast and coffee.  If she does not have breakfast or morning coffee she does not have morning diarrhea.  She does not have diarrhea at other times of the day.  Her appetite is good and her weight is stable.  No abdominal pain or rectal bleeding.  She was evaluated by [facility] hepat[facility] for elevated LFTs with C282Y and H63D genotype. Further evaluation of a mildly dilated CBD revealed CBD sludge and choledocholithiasis although this was not appreciated on MRI/MRCP performed in January 2024.    EUS November 12, 2022 [facility] - Endosonographic imaging of the pancreas showed sonographic changes suggestive of chronic pancreatitis  based on [place] criteria. - The pancreatic duct had a regular endosonographic appearance in the pancreatic head, genu of the pancreas, body of the pancreas and tail of the  pancreas. - Many stones were visualized endosonographically in the gallbladder. - There was dilation in the common bile duct and in the common hepatic duct with sludge and a small stone seen in the distal duct. - Endosonographic imaging in the left lobe of the liver showed no lesion. - The  celiac was visualized and showed no sign of significant endosonographic abnormality. - No lymphadenopathy seen.   ERCP Dec 07, 2022 [facility] The major papilla was in the center of a pantaloon diverticulum. - The biliary tree was swept and sludge was found. This was removed using small sphincterotomy and sphincteroplasty, followed by balloon sweep. Occlusion cholangiogram confirmed a clear main duct at end of  procedure. - Cholelithiasis noted in the gallbladder. - No pancreatogram was attempted or obtained.    Labs / Imaging       Latest Ref Rng & Units 12/17/2022    2:50 PM 08/23/2022   11:53 AM 08/17/2022    3:29 PM  Hepatic Function  Total Protein 6.5 - 8.1 g/dL 7.3  7.4  7.1   Albumin 3.5 - 5.0 g/dL 4.0  4.0  4.0   AST 15 - 41 U/L 57  38  67   ALT 0 - 44 U/L 33  34  50   Alk Phosphatase 38 - 126 U/L 184  149  141   Total Bilirubin 0.3 - 1.2 mg/dL 0.8  0.6  0.7   Bilirubin, Direct 0.0 - 0.3 mg/dL   0.2        Latest Ref Rng & Units 12/17/2022    2:50 PM 08/23/2022   11:53 AM 05/30/2022    3:26 PM  CBC  WBC 4.0 - 10.5 K/uL 11.7  13.1  12.6   Hemoglobin 12.0 - 15.0 g/dL 32.3  55.7  32.2   Hematocrit 36.0 - 46.0 % 39.3  38.5  42.5   Platelets 150 - 400 K/uL  323  388.0  276    Current Medications, Allergies, Past Medical History, Past Surgical History, Family History and Social History were reviewed in [employer] record.   Physical Exam: General: Well developed, well nourished, no acute distress Head: Normocephalic and atraumatic Eyes: Sclerae anicteric, EOMI Ears: Normal auditory acuity Mouth: No deformities or lesions noted Lungs: Clear throughout to auscultation Heart: Regular rate and rhythm; No murmurs, rubs or bruits Abdomen: Soft, non tender and non distended. No masses, hepatosplenomegaly or hernias noted. Normal Bowel sounds Rectal: Not done Musculoskeletal: Symmetrical with no gross deformities  Pulses:  Normal pulses noted Extremities: No  edema or deformities noted Neurological: Alert oriented x 4, grossly nonfocal Psychological:  Alert and cooperative. Normal mood and affect   Stacye Noori T. Russella Dar, MD 01/02/2023, 3:17 PM

## 2023-01-02 NOTE — Patient Instructions (Signed)
Please purchase the following medications over the counter and take as directed: Imodium three times a day as needed.   Please follow up with Dr. Doy Hutching next year.   The [facility] providers would like to encourage you to use [facility] to communicate with providers for non-urgent requests or questions.  Due to long hold times on the telephone, sending your provider a message by [facility] may be a faster and more efficient way to get a response.  Please allow 48 business hours for a response.  Please remember that this is for non-urgent requests.   Thank you for choosing me and [facility].  Venita Lick. Pleas Koch., MD., Clementeen Graham

## 2023-01-15 ENCOUNTER — Other Ambulatory Visit: Payer: Self-pay

## 2023-01-15 MED ORDER — DILTIAZEM HCL ER COATED BEADS 180 MG PO CP24: 180.0000 mg | ORAL_CAPSULE | Freq: Every day | ORAL | 0 refills | Status: DC

## 2023-01-29 ENCOUNTER — Telehealth: Payer: Self-pay | Admitting: *Deleted

## 2023-01-29 NOTE — Telephone Encounter (Signed)
   Name: Veronica Valencia  DOB: April 25, 1955  MRN: [id number]  Primary Cardiologist: Lesleigh Noe, MD (Inactive)  Chart reviewed as part of pre-operative protocol coverage. Because of Veronica Valencia's past medical history and time since last visit, she will require a follow-up in-office visit in order to better assess preoperative cardiovascular risk.  Pre-op covering staff: - Please schedule appointment and call patient to inform them. If patient already had an upcoming appointment within acceptable timeframe, please add "pre-op clearance" to the appointment notes so provider is aware. - Please contact requesting surgeon's office via preferred method (i.e, phone, fax) to inform them of need for appointment prior to surgery.  No medications need to be held.   Sharlene Dory, PA-C  01/29/2023, 3:12 PM

## 2023-01-29 NOTE — Telephone Encounter (Signed)
Preop appt now scheduled

## 2023-01-29 NOTE — Telephone Encounter (Signed)
   Pre-operative Risk Assessment    Patient Name: Veronica Valencia  DOB: 12/19/1955 MRN: [id number]  DATE OF LAST VISIT: 02/06/22 DR. SMITH DATE OF THE NEXT VISIT: NONE    Request for Surgical Clearance    Procedure:   LAPAROSCOPIC CHOLECYSTECTOMY   Date of Surgery:  Clearance TBD                                 Surgeon:  DR. Maura Crandall Surgeon's Group or Practice Name:  [facility] Phone number:  [phone] Fax number:  [phone]   Type of Clearance Requested:   - Medical ; NONE LISTED AS TO BE HELD   Type of Anesthesia:  Not Indicated   Additional requests/questions:    Elpidio Anis   01/29/2023, 1:51 PM

## 2023-02-07 NOTE — Progress Notes (Unsigned)
Cardiology Clinic Note   Patient Name: Veronica Valencia Date of Encounter: 02/07/2023  Primary Care Provider:  Shon Hale, MD Primary Cardiologist:  Lesleigh Noe, MD (Inactive)  Patient Profile    Veronica Valencia 67 year old female presents the clinic today for follow-up evaluation of her paroxysmal atrial fibrillation, paroxysmal SVT and preoperative cardiac evaluation.  Past Medical History    Past Medical History:  Diagnosis Date   Abnormal exercise tolerance test 11/24/2012   Did not reach 85% maximum heart rate. No ischemic changes noted at 79%. Notably beta blocker was not held; no arrhythmias noted.   Episcleritis of right eye    H/O echocardiogram OS 20/40   EF 60-65% with mild concentric LVH. Normal wall motion. Grade 1 diastolic dysfunction. Trace aortic regurgitation. No clear-cut evidence of murmur source   Hot flashes    Hypertension    Lymphocytic colitis    PAF (paroxysmal atrial fibrillation) [facility]) July 2014   Paroxysmal atrial flutter [facility]) August 2014   Paroxysmal SVT (supraventricular tachycardia) (HCC) 10/06/2012   This may have simply been misdiagnosed A. fib   Scleritis and episcleritis of right eye 08/28/2012   Past Surgical History:  Procedure Laterality Date   BREAST BIOPSY Left 10/2020   CESAREAN SECTION     VARICOSE VEIN SURGERY  1990   WRIST SURGERY Left     Allergies  No Known Allergies  History of Present Illness    Veronica Valencia has a PMH of PSVT, PAF, aortic valve sclerosis, hypertension, diastolic CHF, and family history of coronary artery disease.  She was seen in follow-up by Dr. Katrinka Blazing 02/06/2022.  During that time she continued to do well.  She had no subsequent episodes of atrial fibrillation.  She was concerned about her blood pressure running above 140 systolic.  Echocardiogram 06/08/2022 showed an LVEF of 45-50%, G1 DD, mild mitral valve regurgitation, and trivial aortic valve regurgitation.  Her diltiazem was  increased to 180 mg daily to assist with lowering her systolic blood pressure.  Recommendation for adding ACE/ARB or diuretic were made if blood pressure control was not attained.  Follow-up in 1 year was planned.  She presents to the clinic today for follow-up evaluation and preoperative cardiac evaluation.  She states***.  *** denies chest pain, shortness of breath, lower extremity edema, fatigue, palpitations, melena, hematuria, hemoptysis, diaphoresis, weakness, presyncope, syncope, orthopnea, and PND.   Home Medications    Prior to Admission medications   Medication Sig Start Date End Date Taking? Authorizing Provider  Biotin 5000 MCG CAPS Take 1 capsule by mouth daily. Patient not taking: Reported on 08/23/2022    [provider]  diltiazem (CARDIZEM CD) 180 MG 24 hr capsule Take 1 capsule (180 mg total) by mouth daily. 01/15/23   Marykay Lex, MD  flecainide (TAMBOCOR) 100 MG tablet Take 1 tablet (100 mg total) by mouth 2 (two) times daily. 04/13/22   Marykay Lex, MD  losartan (COZAAR) 25 MG tablet Take 25 mg by mouth daily. 07/09/16   [provider]  mesalamine (LIALDA) 1.2 g EC tablet TAKE 2 TABLETS BY MOUTH DAILY WITH BREAKFAST. 10/15/22   Meryl Dare, MD  Multiple Vitamin (MULTIVITAMIN) tablet Take 1 tablet by mouth daily.    [provider]  Omega 3 1000 MG CAPS Take 1 capsule by mouth daily.    [provider]    Family History    Family History  Problem Relation Age of Onset  Diabetes Mother    Hypertension Mother    Stroke Father    Hypertension Father    Heart attack Father 58   Cancer Sister 32       breast   Breast cancer Sister    Ulcerative colitis Son    Healthy Maternal Grandmother    Alcohol abuse Maternal Grandfather    Colon cancer Neg Hx    Esophageal cancer Neg Hx    Rectal cancer Neg Hx    Stomach cancer Neg Hx    She indicated that her mother is deceased. She indicated that her father is deceased. She  indicated that her sister is alive. She indicated that her maternal grandmother is deceased. She indicated that her maternal grandfather is deceased. She indicated that her paternal grandmother is deceased. She indicated that her paternal grandfather is deceased. She indicated that the status of her son is unknown. She indicated that the status of her neg hx is unknown.  Social History    Social History   Socioeconomic History   Marital status: Married    Spouse name: Not on file   Number of children: 3   Years of education: Not on file   Highest education level: Professional school degree (e.g., MD, DDS, DVM, JD)  Occupational History   Occupation: Associate Education officer, environmental    Comment: [employer], [place]   Occupation: minister  Tobacco Use   Smoking status: Never   Smokeless tobacco: Never  Vaping Use   Vaping status: Never Used  Substance and Sexual Activity   Alcohol use: Yes    Comment: 2 glasses wine daily   Drug use: No   Sexual activity: Not on file  Other Topics Concern   Not on file  Social History Narrative   She is a married mother of 3.    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   Social Determinants of Health   Financial Resource Strain: Not on file  Food Insecurity: No Food Insecurity (05/30/2022)   Hunger Vital Sign    Worried About Running Out of Food in the Last Year: Never true    Ran Out of Food in the Last Year: Never true  Transportation Needs: No Transportation Needs (05/30/2022)   PRAPARE - Administrator, Civil Service (Medical): No    Lack of Transportation (Non-Medical): No  Physical Activity: Not on file  Stress: Not on file  Social Connections: Not on file  Intimate Partner  Violence: Not At Risk (05/30/2022)   Humiliation, Afraid, Rape, and Kick questionnaire    Fear of Current or Ex-Partner: No    Emotionally Abused: No    Physically Abused: No    Sexually Abused: No     Review of Systems    General:  No chills, fever, night sweats or weight changes.  Cardiovascular:  No chest pain, dyspnea on exertion, edema, orthopnea, palpitations, paroxysmal nocturnal dyspnea. Dermatological: No rash, lesions/masses Respiratory: No cough, dyspnea Urologic: No hematuria, dysuria Abdominal:   No nausea, vomiting, diarrhea, bright red blood per rectum, melena, or hematemesis Neurologic:  No visual changes, wkns, changes in mental status. All other systems reviewed and are otherwise negative except as noted above.  Physical Exam    VS:  There were no vitals taken for this visit. , BMI There is no height or weight on file to calculate BMI. GEN: Well nourished, well developed, in no acute distress. HEENT: normal. Neck: Supple, no JVD, carotid bruits, or masses. Cardiac: RRR, no murmurs, rubs, or gallops. No clubbing, cyanosis, edema.  Radials/DP/PT 2+ and equal bilaterally.  Respiratory:  Respirations regular and unlabored, clear to auscultation bilaterally. GI: Soft, nontender, nondistended, BS + x 4. MS: no deformity or atrophy. Skin: warm and dry, no rash. Neuro:  Strength and sensation are intact. Psych: Normal affect.  Accessory Clinical Findings    Recent Labs: 08/23/2022: TSH 1.17 12/17/2022: ALT 33; BUN 12; Creatinine 1.07; Hemoglobin 13.8; Platelet Count 323; Potassium 4.9; Sodium 134   Recent Lipid Panel    Component Value Date/Time   CHOL 189 11/14/2021 1531   TRIG 86.0 11/14/2021 1531   HDL 108.40 11/14/2021 1531   CHOLHDL 2 11/14/2021 1531   VLDL 17.2 11/14/2021 1531   LDLCALC 63 11/14/2021 1531   LDLCALC 88 09/16/2019 1126    No BP recorded.  {Refresh Note OR Click here to enter BP  :1}***    ECG personally reviewed by me today- ***     Echocardiogram 06/08/2022  IMPRESSIONS     1. Left ventricular ejection fraction, by estimation, is 45 to 50%. The  left ventricle has mildly decreased function. The left ventricle  demonstrates regional wall motion abnormalities (see scoring  diagram/findings for description). Left ventricular  diastolic parameters are consistent with Grade I diastolic dysfunction  (impaired relaxation).   2. Right ventricular systolic function is normal. The right ventricular  size is normal. Tricuspid regurgitation signal is inadequate for assessing  PA pressure.   3. The mitral valve is normal in structure. Mild mitral valve  regurgitation. No evidence of mitral stenosis.   4. The aortic valve is tricuspid. Aortic valve regurgitation is trivial.  No aortic stenosis is present.   5. The inferior vena cava is normal in size with greater than 50%  respiratory variability, suggesting right atrial pressure of 3 mmHg.   FINDINGS   Left Ventricle: Left ventricular ejection fraction, by estimation, is 45  to 50%. The left ventricle has mildly decreased function. The left  ventricle demonstrates regional wall motion abnormalities. The left  ventricular internal cavity size was normal  in size. There is borderline left ventricular hypertrophy. Left  ventricular diastolic parameters are consistent with Grade I diastolic  dysfunction (impaired relaxation).     LV Wall Scoring:  The apical lateral segment, apical septal segment, apical anterior  segment,  and apical inferior segment are hypokinetic.   Right Ventricle: The right ventricular size is normal. No increase in  right ventricular wall thickness. Right ventricular systolic function is  normal. Tricuspid regurgitation signal is inadequate for assessing PA  pressure.   Left Atrium: Left atrial size was normal in size.   Right Atrium: Right atrial size was normal in size.   Pericardium: There is no evidence of pericardial effusion.    Mitral Valve: The mitral valve is normal in structure. Mild mitral valve  regurgitation. No evidence of mitral valve stenosis. MV peak gradient, 4.2  mmHg. The mean mitral valve gradient is 1.0 mmHg.   Tricuspid Valve: The tricuspid valve is normal in structure. Tricuspid  valve regurgitation is trivial. No evidence of tricuspid stenosis.   Aortic Valve: The aortic valve is tricuspid. Aortic valve regurgitation is  trivial. No aortic stenosis is present. Aortic valve mean gradient  measures 4.5 mmHg. Aortic valve peak gradient measures 8.4 mmHg. Aortic  valve area, by VTI measures 1.93 cm.   Pulmonic Valve: The pulmonic valve was normal in structure. Pulmonic valve  regurgitation is trivial. No evidence of pulmonic stenosis.   Aorta: The aortic root is normal in size and structure.   Venous: The inferior vena cava is normal in size with greater than 50%  respiratory variability, suggesting right atrial pressure of 3 mmHg.   IAS/Shunts: No atrial level shunt detected by color flow Doppler.        Assessment & Plan   1.  Paroxysmal atrial fibrillation-EKG today shows***.  Denies recent episodes of accelerated or irregular heartbeat.  She was noted to have chronotropic incompetence with beta-blocker therapy. Avoid triggers caffeine, chocolate, EtOH, dehydration etc. Continue diltiazem  Essential hypertension-BP today***. Maintain blood pressure log Continue losartan Heart healthy low-sodium diet  Paroxysmal SVT-denies recent episodes of accelerated or skipped heartbeats. Continue diltiazem May use vagal maneuvers  Aortic valve sclerosis-echocardiogram 4/24 showed trivial aortic valve regurgitation.  Continues to maintain baseline physical activity. Plan for repeat echocardiogram when clinically indicated   Preoperative cardiac evaluation-laparoscopic cholecystectomy, TBD, Dr. Su Grand, [facility] for metabolic and weight loss surgery, fax #[phone]       Primary Cardiologist: Lesleigh Noe, MD (Inactive)  Chart reviewed as part of pre-operative protocol coverage. Given past medical history and time since last visit, based on ACC/AHA guidelines, Veronica Valencia would be at acceptable risk for the planned procedure without further cardiovascular testing.   Her RCRI is low risk, 0.9% risk of major cardiac event.  She is able to complete greater than 4 METS of physical activity.  Patient was advised that if she*** develops new symptoms prior to surgery to contact our office to arrange a follow-up appointment.  He verbalized understanding.  I will route this recommendation to the requesting party via Epic fax function and remove from pre-op pool.    Disposition: Follow-up with Dr. Bethena Roys or me in 12 months.   Thomasene Ripple. Brianda Beitler NP-C     02/07/2023, 11:56 AM [facility] [address] Office [phone] Fax [phone]    I spent***minutes examining this patient, reviewing medications, and using patient centered shared decision making involving her cardiac care.   I spent greater than 20 minutes reviewing her past medical history,  medications, and prior cardiac tests.

## 2023-02-12 ENCOUNTER — Ambulatory Visit: Payer: Medicare Other | Attending: General Practice | Admitting: General Practice

## 2023-02-12 ENCOUNTER — Encounter: Payer: Self-pay | Admitting: General Practice

## 2023-02-12 VITALS — BP 118/74 | HR 67 | Ht 61.0 in | Wt 153.2 lb

## 2023-02-12 DIAGNOSIS — I471 Supraventricular tachycardia, unspecified: Secondary | ICD-10-CM | POA: Insufficient documentation

## 2023-02-12 DIAGNOSIS — I10 Essential (primary) hypertension: Secondary | ICD-10-CM | POA: Insufficient documentation

## 2023-02-12 DIAGNOSIS — I480 Paroxysmal atrial fibrillation: Secondary | ICD-10-CM | POA: Insufficient documentation

## 2023-02-12 DIAGNOSIS — I358 Other nonrheumatic aortic valve disorders: Secondary | ICD-10-CM | POA: Diagnosis present

## 2023-02-12 DIAGNOSIS — Z01810 Encounter for preprocedural cardiovascular examination: Secondary | ICD-10-CM | POA: Diagnosis present

## 2023-02-12 NOTE — Patient Instructions (Addendum)
Medication Instructions:  The current medical regimen is effective;  continue present plan and medications as directed. Please refer to the Current Medication list given to you today.  *If you need a refill on your cardiac medications before your next appointment, please call your pharmacy*  Lab Work: NONE  Other Instructions Ok for upcoming surgery  Follow-Up: At [facility], you and your health needs are our priority.  As part of our continuing mission to provide you with exceptional heart care, we have created designated Provider Care Teams.  These Care Teams include your primary Cardiologist (physician) and Advanced Practice Providers (APPs -  Physician Assistants and Nurse Practitioners) who all work together to provide you with the care you need, when you need it.  Your next appointment:   12 month(s)  Provider:   Edd Fabian, FNP-C-CALL WITH NEW CARDIOLOGIST

## 2023-04-19 ENCOUNTER — Other Ambulatory Visit: Payer: Self-pay | Admitting: Cardiology

## 2023-04-29 MED ORDER — FLECAINIDE ACETATE 100 MG PO TABS: 100.0000 mg | ORAL_TABLET | Freq: Two times a day (BID) | ORAL | 3 refills | Status: DC

## 2023-07-16 ENCOUNTER — Ambulatory Visit
Admission: RE | Admit: 2023-07-16 | Discharge: 2023-07-16 | Disposition: A | Discharge status: Home / Self Care | Payer: Medicare Other | Source: Ambulatory Visit | Attending: Family Medicine

## 2023-07-16 DIAGNOSIS — E2839 Other primary ovarian failure: Secondary | ICD-10-CM

## 2023-09-04 ENCOUNTER — Ambulatory Visit (INDEPENDENT_AMBULATORY_CARE_PROVIDER_SITE_OTHER): Admitting: Podiatry

## 2023-09-04 ENCOUNTER — Encounter: Payer: Self-pay | Admitting: Podiatry

## 2023-09-04 DIAGNOSIS — M79671 Pain in right foot: Secondary | ICD-10-CM | POA: Diagnosis not present

## 2023-09-04 DIAGNOSIS — S90851A Superficial foreign body, right foot, initial encounter: Secondary | ICD-10-CM

## 2023-09-04 NOTE — Progress Notes (Signed)
 Patient presents with a complaint of a thorn stuck in the bottom of the foot.  She was out gardening and got a plantar from a thorn stuck in her foot it been painful.  Has not noticed any redness or drainage from the area.  Small amount of swelling.    Physical exam:  General appearance: Pleasant, and in no acute distress. AOx3.  Vascular: Pedal pulses: DP 2/4 bilaterally, PT 2/4 bilaterally.  Mild edema lower legs bilaterally. Capillary fill time immediate bilaterally.  Neurological: Light touch intact feet bilaterally.  Normal Achilles reflex bilaterally.  No clonus or spasticity noted.   Dermatologic:   Area of focal tenderness plantar aspect first metatarsal phalangeal joint with a small and of the foreign body noted.  Is black in color.  Some swelling around the foreign body but no redness noted.  Skin normal temperature bilaterally.  Skin normal color, tone, and texture bilaterally.   Musculoskeletal: Mild hallux abductovalgus right    Diagnosis: 1.  Pain right foot. 2.  Foreign body into subcutaneous tissue right foot.  Plan: -New patient visit office level 3 for evaluation and management.  Modifier 25. - Discussed with that the foreign body recommended removing the foreign body under local anesthesia.  Discussed this with her. -Removal of foreign body simple and subcutaneous tissue right foot: Anesthetized area with 3.0 cc of 2% lidocaine with epinephrine.  Utilizing a 15 blade made a small stab incision over the area of the foreign body.  Foreign body was removed in toto.  Area was irrigated with some local anesthetic.  No pus or other discharge was noted.  Small 5 mm long and of a thorn was noted.  Applied antibiotic ointment and a light dressing with gauze and Coban Over the area. -Instructed to keep dressing on until tomorrow morning then remove dressing soaked 15 minutes in warm Epsom salt water antibiotic ointment and a light dressing.  Do this for about a week -Any  signs of infection or not healed in 2 weeks she is to call for examination.  Return as needed

## 2023-12-05 ENCOUNTER — Other Ambulatory Visit: Payer: Self-pay | Admitting: Family Medicine

## 2023-12-05 DIAGNOSIS — Z1231 Encounter for screening mammogram for malignant neoplasm of breast: Secondary | ICD-10-CM

## 2023-12-25 ENCOUNTER — Ambulatory Visit: Payer: Medicare Other | Admitting: Hematology and Oncology

## 2023-12-25 ENCOUNTER — Other Ambulatory Visit: Payer: Medicare Other

## 2023-12-26 ENCOUNTER — Ambulatory Visit: Admitting: Hematology and Oncology

## 2023-12-26 ENCOUNTER — Other Ambulatory Visit

## 2024-01-08 ENCOUNTER — Ambulatory Visit
Admission: RE | Admit: 2024-01-08 | Discharge: 2024-01-08 | Disposition: A | Discharge status: Home / Self Care | Source: Ambulatory Visit | Attending: Family Medicine | Admitting: Family Medicine

## 2024-01-08 DIAGNOSIS — Z1231 Encounter for screening mammogram for malignant neoplasm of breast: Secondary | ICD-10-CM

## 2024-01-17 ENCOUNTER — Inpatient Hospital Stay

## 2024-01-17 ENCOUNTER — Inpatient Hospital Stay: Admitting: Hematology and Oncology

## 2024-02-12 ENCOUNTER — Other Ambulatory Visit: Payer: Self-pay | Admitting: Hematology and Oncology

## 2024-02-12 ENCOUNTER — Inpatient Hospital Stay: Admitting: Hematology and Oncology

## 2024-02-12 ENCOUNTER — Inpatient Hospital Stay: Attending: Hematology and Oncology

## 2024-02-12 DIAGNOSIS — Z803 Family history of malignant neoplasm of breast: Secondary | ICD-10-CM | POA: Diagnosis not present

## 2024-02-12 LAB — CMP (CANCER CENTER ONLY)
ALT: 50 U/L — ABNORMAL HIGH (ref 0–44)
AST: 97 U/L — ABNORMAL HIGH (ref 15–41)
Albumin: 4.4 g/dL (ref 3.5–5.0)
Alkaline Phosphatase: 188 U/L — ABNORMAL HIGH (ref 38–126)
Anion gap: 12 (ref 5–15)
BUN: 12 mg/dL (ref 8–23)
CO2: 25 mmol/L (ref 22–32)
Calcium: 9.6 mg/dL (ref 8.9–10.3)
Chloride: 98 mmol/L (ref 98–111)
Creatinine: 1.04 mg/dL — ABNORMAL HIGH (ref 0.44–1.00)
GFR, Estimated: 59 mL/min — ABNORMAL LOW (ref >60)
Glucose, Bld: 113 mg/dL — ABNORMAL HIGH (ref 70–99)
Potassium: 4.6 mmol/L (ref 3.5–5.1)
Sodium: 135 mmol/L (ref 135–145)
Total Bilirubin: 0.7 mg/dL (ref 0.0–1.2)
Total Protein: 7.6 g/dL (ref 6.5–8.1)

## 2024-02-12 LAB — IRON AND IRON BINDING CAPACITY (CC-WL,HP ONLY)
Iron: 123 ug/dL (ref 28–170)
Saturation Ratios: 33 % — ABNORMAL HIGH (ref 10.4–31.8)
TIBC: 377 ug/dL (ref 250–450)
UIBC: 254 ug/dL

## 2024-02-12 LAB — CBC WITH DIFFERENTIAL (CANCER CENTER ONLY)
Abs Immature Granulocytes: 0.03 K/uL (ref 0.00–0.07)
Basophils Absolute: 0.1 K/uL (ref 0.0–0.1)
Basophils Relative: 1 %
Eosinophils Absolute: 0.3 K/uL (ref 0.0–0.5)
Eosinophils Relative: 3 %
HCT: 43 % (ref 36.0–46.0)
Hemoglobin: 15.2 g/dL — ABNORMAL HIGH (ref 12.0–15.0)
Immature Granulocytes: 0 %
Lymphocytes Relative: 12 %
Lymphs Abs: 1.2 K/uL (ref 0.7–4.0)
MCH: 34.5 pg — ABNORMAL HIGH (ref 26.0–34.0)
MCHC: 35.3 g/dL (ref 30.0–36.0)
MCV: 97.7 fL (ref 80.0–100.0)
Monocytes Absolute: 0.9 K/uL (ref 0.1–1.0)
Monocytes Relative: 8 %
Neutro Abs: 7.8 K/uL — ABNORMAL HIGH (ref 1.7–7.7)
Neutrophils Relative %: 76 %
Platelet Count: 252 K/uL (ref 150–400)
RBC: 4.4 MIL/uL (ref 3.87–5.11)
RDW: 11.3 % — ABNORMAL LOW (ref 11.5–15.5)
WBC Count: 10.3 K/uL (ref 4.0–10.5)
nRBC: 0 % (ref 0.0–0.2)

## 2024-02-12 LAB — FERRITIN: Ferritin: 449 ng/mL — ABNORMAL HIGH (ref 11–307)

## 2024-02-12 NOTE — Progress Notes (Unsigned)
 [facility] [phone]   [phone]  PROGRESS NOTE  Patient Care Team: Chrystal Lamarr RAMAN, MD as PCP - General (Family Medicine) Claudene Victory ORN, MD (Inactive) as PCP - Cardiology (Cardiology)  Hematological/Oncological History # Hereditary Hemochromatosis 7/20/20223: Elevated LFTs with AST 68, ALT 43, Alk Phos 126.  11/14/2021: Elevated LFTs with AST 70, ALT 46, Alk Phos 141 12/27/2021: Abdominal US  showed common bile duct is abnormally dilated to 10.8 mm. Sludge and stones in the gallbladder. Diffuse increased echogenicity through the liver is nonspecific. 03/14/2022: Established care with Dr. Aneita at [facility]. 03/24/2022: Iron 196 (H), Saturation 54.9% (H), Ferritin 532.1 (H) 03/27/2022: MRCP: Cholelithiasis, mild biliary ductal dilatation without evidence of choledocholithiasis or stricture, mild hepatic steatosis. 04/13/2022: Hemochromatosis DNA testing showed homozygous mutation of H63D gene.  05/30/2022: Establish care with [facility]   Interval History:  Veronica Valencia 68 y.o. female with medical history significant for homozygous H63D hereditary hemochromatosis who presents for a follow up visit. The patient's last visit was on 12/17/2022. In the interim since the last visit she has had no major changes in her health.  On exam today Mrs. Carothers reports she has had no recent hospitalizations, ER visits, or starting new medications.  She reports that her blood pressure is elevated but she is not having any headache, vision changes, or neck pain.  She reports that she does her best to try to donate blood quarterly.  She notes that her energy levels are good and her appetite is strong.  She has had no recent issues with fevers, chills, sweats, nausea, vomiting or diarrhea.  She reports that she does not currently take any iron supplements and does her best to try to avoid iron rich foods.  Overall she feels well and has no additional questions  concerns or complaints today.  A full 10 point ROS is otherwise negative.  MEDICAL HISTORY:  Past Medical History:  Diagnosis Date   Abnormal exercise tolerance test 11/24/2012   Did not reach 85% maximum heart rate. No ischemic changes noted at 79%. Notably beta blocker was not held; no arrhythmias noted.   Episcleritis of right eye    H/O echocardiogram OS 20/40   EF 60-65% with mild concentric LVH. Normal wall motion. Grade 1 diastolic dysfunction. Trace aortic regurgitation. No clear-cut evidence of murmur source   Hot flashes    Hypertension    Lymphocytic colitis    PAF (paroxysmal atrial fibrillation) [facility]) July 2014   Paroxysmal atrial flutter [facility]) August 2014   Paroxysmal SVT (supraventricular tachycardia) 10/06/2012   This may have simply been misdiagnosed A. fib   Scleritis and episcleritis of right eye 08/28/2012    SURGICAL HISTORY: Past Surgical History:  Procedure Laterality Date   BREAST BIOPSY Left 10/2020   CESAREAN SECTION     VARICOSE VEIN SURGERY  1990   WRIST SURGERY Left     SOCIAL HISTORY: Social History   Socioeconomic History   Marital status: Married    Spouse name: Not on file   Number of children: 3   Years of education: Not on file   Highest education level: Professional school degree (e.g., MD, DDS, DVM, JD)  Occupational History   Occupation: Associate Education Officer, Environmental    Comment: [employer], [place]   Occupation: minister  Tobacco Use   Smoking status: Never   Smokeless tobacco: Never  Vaping Use   Vaping status: Never Used  Substance and Sexual Activity   Alcohol use: Yes  Comment: 2 glasses wine daily   Drug use: No   Sexual activity: Not on file  Other Topics Concern   Not on file  Social History Narrative   She is a married mother of 3.    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   Social Drivers of Health   Tobacco Use: Low Risk (09/04/2023)   Patient History    Smoking Tobacco Use: Never    Smokeless Tobacco Use: Never    Passive Exposure: Not on file  Financial Resource Strain: Low Risk  (03/18/2023)   Received from [facility]   Overall Financial Resource Strain (CARDIA)    Difficulty of Paying Living Expenses: Not hard at all  Food Insecurity: No Food Insecurity (03/18/2023)   Received from [facility]   Epic    Within the past 12 months, you worried that your food would run out before you got the money to buy more.: Never true    Within the past 12 months, the food you bought just didn't last and you didn't have money to get more.: Never true  Transportation Needs: Unknown (03/18/2023)   Received from [facility] - Transportation    In the past 12 months, has lack of transportation kept you from medical appointments or from getting medications?: No    Lack of Transportation (Non-Medical): Not on file  Physical Activity: Not on file  Stress: Not on file  Social Connections: Not on file  Intimate Partner Violence: Not At Risk (05/30/2022)   Humiliation, Afraid, Rape, and Kick questionnaire    Fear of Current or Ex-Partner: No    Emotionally Abused: No    Physically Abused: No    Sexually Abused: No  Depression (PHQ2-9): Low Risk (05/30/2022)   Depression (PHQ2-9)    PHQ-2 Score: 0  Alcohol Screen: Not on file  Housing: Low Risk  (03/18/2023)   Received from [facility]   Epic    In the last 12 months, was there a time when you were not able to pay the mortgage or rent on time?: No    In the past 12 months, how many times have you moved where you were living?: 0    At any time in the past 12 months, were you homeless or living in a shelter (including now)?: No   Utilities: Not At Risk (03/18/2023)   Received from [facility] Utilities    Threatened with loss of utilities: No  Health Literacy: Not on file    FAMILY HISTORY: Family History  Problem Relation Age of Onset   Diabetes Mother    Hypertension Mother    Stroke Father    Hypertension Father    Heart attack Father 17   Cancer Sister 71       breast   Breast cancer Sister    Ulcerative colitis Son    Healthy Maternal Grandmother    Alcohol abuse Maternal Grandfather    Colon cancer Neg Hx    Esophageal cancer Neg Hx    Rectal cancer Neg Hx    Stomach cancer Neg Hx     ALLERGIES:  has no known allergies.  MEDICATIONS:  Current Outpatient Medications  Medication Sig Dispense Refill   Biotin 5000 MCG CAPS Take 1 capsule by mouth daily. (Patient not taking: Reported on 09/04/2023)     diltiazem  (CARDIZEM  CD) 180 MG 24 hr capsule TAKE 1 CAPSULE BY MOUTH EVERY DAY 90 capsule 3   flecainide  (TAMBOCOR ) 100 MG tablet Take 1 tablet (100 mg total) by mouth 2 (two) times daily. 180 tablet 3   losartan  (COZAAR ) 25 MG tablet Take 25 mg by mouth daily. (Patient not taking: Reported on 09/04/2023)     mesalamine  (LIALDA ) 1.2 g EC tablet TAKE 2 TABLETS BY MOUTH DAILY WITH BREAKFAST. (Patient not taking: Reported on 09/04/2023) 180 tablet 0   Multiple Vitamin (MULTIVITAMIN) tablet Take 1 tablet by mouth daily.     Omega 3 1000 MG CAPS Take 1 capsule by mouth daily.     No current facility-administered medications for this visit.    REVIEW OF SYSTEMS:   Constitutional: ( - ) fevers, ( - )  chills , ( - ) night sweats Eyes: ( - ) blurriness of vision, ( - ) double vision, ( - ) watery eyes Ears, nose, mouth, throat, and face: ( - ) mucositis, ( - ) sore throat Respiratory: ( - ) cough, ( - ) dyspnea, ( - ) wheezes Cardiovascular: ( - ) palpitation, ( - ) chest discomfort, ( - ) lower extremity swelling Gastrointestinal:  ( - ) nausea, ( - ) heartburn, ( - ) change in  bowel habits Skin: ( - ) abnormal skin rashes Lymphatics: ( - ) new lymphadenopathy, ( - ) easy bruising Neurological: ( - ) numbness, ( - ) tingling, ( - ) new weaknesses Behavioral/Psych: ( - ) mood change, ( - ) new changes  All other systems were reviewed with the patient and are negative.  PHYSICAL EXAMINATION: Vitals:   02/12/24 1445 02/12/24 1447  BP: (!) 171/79 (!) 157/65  Pulse: 69   Resp: 13   Temp: (!) 97.3 F (36.3 C)   SpO2: 100%     Filed Weights   02/12/24 1445  Weight: 152 lb 12.8 oz (69.3 kg)     GENERAL: Well-appearing middle-aged Caucasian female, alert, no distress and comfortable SKIN: skin color, texture, turgor are normal, no rashes or significant lesions EYES: conjunctiva are pink and non-injected, sclera clear LUNGS: clear to auscultation and percussion with normal breathing effort HEART: regular rate & rhythm and no murmurs and no lower extremity edema Musculoskeletal: no cyanosis of digits and no clubbing  PSYCH: alert & oriented x 3, fluent speech NEURO: no focal motor/sensory deficits  LABORATORY DATA:  I have reviewed the data as listed    Latest Ref Rng & Units 02/12/2024    2:17 PM 12/17/2022    2:50 PM 08/23/2022   11:53 AM  CBC  WBC 4.0 - 10.5 K/uL 10.3  11.7  13.1   Hemoglobin 12.0 - 15.0 g/dL 84.7  86.1  86.9   Hematocrit 36.0 - 46.0 % 43.0  39.3  38.5   Platelets 150 - 400 K/uL 252  323  388.0        Latest Ref Rng & Units 02/12/2024    2:17 PM 12/17/2022    2:50 PM 08/23/2022   11:53 AM  CMP  Glucose 70 - 99 mg/dL 886  890  886   BUN 8 - 23 mg/dL 12  12  12    Creatinine 0.44 - 1.00 mg/dL 8.95  8.92  9.10   Sodium  135 - 145 mmol/L 135  134  135   Potassium 3.5 - 5.1 mmol/L 4.6  4.9  4.8   Chloride 98 - 111 mmol/L 98  98  99   CO2 22 - 32 mmol/L 25  28  26    Calcium 8.9 - 10.3 mg/dL 9.6  9.5  9.2   Total Protein 6.5 - 8.1 g/dL 7.6  7.3  7.4   Total Bilirubin 0.0 - 1.2 mg/dL 0.7  0.8  0.6   Alkaline Phos 38 - 126 U/L 188   184  149   AST 15 - 41 U/L 97  57  38   ALT 0 - 44 U/L 50  33  34     RADIOGRAPHIC STUDIES:  No results found.  ASSESSMENT & PLAN ANTASIA HAIDER is a 68 y.o. female who presents to the clinic for follow up of homozygous H63D hereditary hemochromatosis.   Hereditary hemochromatosis is a hereditary condition caused by mutations in the HFE gene, which regulates iron absorption. The most common genes mutated in this condition are the C282Y and H63D genes. Since Ms. Mcfadyen has the homozygous mutation involving the H63D gene, therapeutic phlebotomy would be indicated if there is evidence of organ involvement.  The patient underwent MRI of the liver in April 2024 which showed no evidence of increased iron deposition.  Therefore I do not believe phlebotomy is required at this time.   #Hereditary hemochromatosis, homozygous mutation of H63D gene: --Confirmed on hemochromatosis genetic testing on 04/13/2022.  --Underwent MRI in April 2024 to assess for iron deposition.  No evidence of iron deposition.  Biopsy would still be definitive but will defer to gastroenterology for further evaluation --If liver biopsy does show evidence of iron deposition of disease we will bring patient back for start of phlebotomies. --Discuss to consume iron rich foods and alcohol in moderation.  --Labs today show white blood cell 10.3, hemoglobin 15.2, MCV 97.7, platelets 252.  LFTs show AST 97, ALT 50, bilirubin 0.7 --RTC in 1 years time to re-evaluate.   No orders of the defined types were placed in this encounter.   All questions were answered. The patient knows to call the clinic with any problems, questions or concerns.  A total of more than 25 minutes were spent on this encounter with face-to-face time and non-face-to-face time, including preparing to see the patient, ordering tests and/or medications, counseling the patient and coordination of care as outlined above.   Norleen IVAR Kidney, MD Department of  Hematology/Oncology [facility] at [facility] Phone: [phone] Pager: [phone] Email: [email or web address]  02/16/2024 7:35 PM

## 2024-03-24 ENCOUNTER — Telehealth (HOSPITAL_BASED_OUTPATIENT_CLINIC_OR_DEPARTMENT_OTHER): Payer: Self-pay | Admitting: *Deleted

## 2024-03-24 NOTE — Telephone Encounter (Signed)
"  ° °  Pre-operative Risk Assessment    Patient Name: Veronica Valencia  DOB: 09-28-1955 MRN: [id number]   Date of last office visit: 02/12/23 JOSEFA BEAUVAIS, FNP Date of next office visit: 04/01/24 DR. SEGAL   PER NOTES ON FORM: NEED RISK STRATIFICATION PRIOR TO SURGERY RELATED TO A-FIB AND SVT .  STRICT BP AND HR PARAMETERS WITH IN 10% OF NORMAL LIMITS FOR SURGERY WILL ALSO NEED TO BE MAINTAINED  Request for Surgical Clearance    Procedure:  NECK LIFT AND BLEPHAROPLASTY   Date of Surgery:  Clearance TBD                                Surgeon:  DR. CATARINO Surgeon's Group or Practice Name:  [facility]  PLASTIC AND CONSTRUCTIVE SURGERY AT [facility] Phone number:  [phone] Fax number:  [phone]   Type of Clearance Requested:   - Medical    Type of Anesthesia:  General    Additional requests/questions:    Bonney Niels Jest   03/24/2024, 2:59 PM   "

## 2024-03-24 NOTE — Telephone Encounter (Signed)
" ° °  Name: Veronica Valencia  DOB: 01-18-1956  MRN: [id number]  Primary Cardiologist: Victory LELON Claudene DOUGLAS, MD (Inactive)  Chart reviewed as part of pre-operative protocol coverage. The patient has an upcoming visit scheduled with Dr. Kriste on 04/01/2024 at which time clearance can be addressed in case there are any issues that would impact surgical recommendations.  Neck lift and blepharoplasty is not scheduled until TBD as below. I added preop FYI to appointment note so that provider is aware to address at time of outpatient visit.  Per office protocol the cardiology provider should forward their finalized clearance decision and recommendations regarding antiplatelet therapy to the requesting party below.    I will route this message as FYI to requesting party and remove this message from the preop box as separate preop APP input not needed at this time.   Please call with any questions.  Lum LITTIE Louis, NP  03/24/2024, 3:17 PM   "

## 2024-03-26 ENCOUNTER — Other Ambulatory Visit

## 2024-03-26 ENCOUNTER — Ambulatory Visit (INDEPENDENT_AMBULATORY_CARE_PROVIDER_SITE_OTHER)
Admission: RE | Admit: 2024-03-26 | Discharge: 2024-03-26 | Disposition: A | Discharge status: Home / Self Care | Source: Ambulatory Visit | Attending: Physician Assistant | Admitting: Physician Assistant

## 2024-03-26 ENCOUNTER — Ambulatory Visit: Admitting: Physician Assistant

## 2024-03-26 ENCOUNTER — Encounter: Payer: Self-pay | Admitting: Physician Assistant

## 2024-03-26 VITALS — BP 110/66 | HR 66 | Ht 61.0 in | Wt 154.0 lb

## 2024-03-26 DIAGNOSIS — K580 Irritable bowel syndrome with diarrhea: Secondary | ICD-10-CM

## 2024-03-26 DIAGNOSIS — Z8719 Personal history of other diseases of the digestive system: Secondary | ICD-10-CM

## 2024-03-26 DIAGNOSIS — K861 Other chronic pancreatitis: Secondary | ICD-10-CM

## 2024-03-26 DIAGNOSIS — K7581 Nonalcoholic steatohepatitis (NASH): Secondary | ICD-10-CM

## 2024-03-26 DIAGNOSIS — K8681 Exocrine pancreatic insufficiency: Secondary | ICD-10-CM

## 2024-03-26 DIAGNOSIS — K52832 Lymphocytic colitis: Secondary | ICD-10-CM

## 2024-03-26 DIAGNOSIS — M9905 Segmental and somatic dysfunction of pelvic region: Secondary | ICD-10-CM

## 2024-03-26 LAB — C-REACTIVE PROTEIN: CRP: 0.6 mg/dL — ABNORMAL LOW (ref 1.0–20.0)

## 2024-03-26 LAB — SEDIMENTATION RATE: Sed Rate: 8 mm/h (ref 0–30)

## 2024-03-26 MED ORDER — RIFAXIMIN 550 MG PO TABS: 550.0000 mg | ORAL_TABLET | Freq: Three times a day (TID) | ORAL | 0 refills | Status: AC

## 2024-03-26 NOTE — Progress Notes (Signed)
 I agree with the assessment and plan as outlined by Ms. Craig.

## 2024-03-26 NOTE — Patient Instructions (Addendum)
 Your provider has requested that you go to the basement level for lab work before leaving today. Press B on the elevator. The lab is located at the first door on the left as you exit the elevator.  Your provider has requested that you have an abdominal x ray before leaving today. Please go to the basement floor to our Radiology department for the test.  - You could try bismuth salicylate (Pepto-Bismol) 30 mL or two tablets every 30 minutes for eight doses. Pepto-Bismol may make your stools black.   VISIT SUMMARY:  During your visit, we discussed your persistent diarrhea and related symptoms. We reviewed your history of lymphocytic colitis, IBS-D, and other health conditions. We have outlined a plan to address your symptoms and improve your quality of life.  YOUR PLAN:  CHRONIC DIARRHEA DUE TO LYMPHOCYTIC COLITIS AND IBS-D: You have persistent diarrhea that has worsened since your gallbladder removal. Previous treatments were either ineffective or caused significant side effects. -Start a trial of bismuth subsalicylate, up to 8 tablets daily as needed, especially during travel. -Monitor for black stools as a benign side effect. -Defer bismuth subsalicylate until after completing SIBO treatment.  SMALL INTESTINAL BACTERIAL OVERGROWTH (SIBO): You have symptoms and a history that suggest SIBO, and we have decided to start empiric therapy. -Start rifaximin  therapy to improve diarrhea and bloating. -Hold bismuth subsalicylate until after completing rifaximin  course.  PELVIC FLOOR DYSFUNCTION WITH CHRONIC CONSTIPATION: Decreased rectal tone and retained stool suggest pelvic floor dysfunction, contributing to constipation and overflow diarrhea. -Get an abdominal radiograph to evaluate retained stool. -Consider pelvic floor physical therapy.  POST-CHOLECYSTECTOMY SYNDROME: Your diarrhea may be related to bile acid malabsorption after gallbladder removal. -Consider bile acid sequestrants if  diarrhea persists.  CHRONIC PANCREATITIS WITH POSSIBLE EXOCRINE PANCREATIC INSUFFICIENCY: You have symptoms suggesting exocrine pancreatic insufficiency, and further evaluation is needed. -Get a stool pancreatic elastase test. -Get a stool fat analysis.  Small intestinal bacterial overgrowth (SIBO) occurs when there is an abnormal increase in the overall bacterial population in the small intestine -- particularly types of bacteria not commonly found in that part of the digestive tract. Small intestinal bacterial overgrowth (SIBO) commonly results when a circumstance -- such as surgery or disease -- slows the passage of food and waste products in the digestive tract, creating a breeding ground for bacteria.  Signs and symptoms of SIBO often include: Loss of appetite Abdominal pain Nausea Bloating An uncomfortable feeling of fullness after eating Diarrhea or constipation, depending on the type of gas produced  What foods trigger SIBO? While foods arent the original cause of SIBO, certain foods do encourage the overgrowth of the wrong bacteria in your small intestine. If youre feeding them their favorite foods, theyre going to grow more, and that will trigger more of your SIBO symptoms. By the same token, you can help reduce the overgrowth by starving the problematic bacteria of their favorite foods. This strategy has led to a number of proposed SIBO eating plans. The plans vary, and so do individual results. But in general, they tend to recommend limiting carbohydrates.  These include: Sugars and sweeteners. Fruits and starchy vegetables. Dairy products. Grains.  There is a test for this we can do called a breath test, if you are positive we will treat you with an antibiotic to see if it helps.  Your symptoms are very suspicious for this condition, as discussed, we will start you on an antibiotic to see if this helps.   We have sent  the following medications to your pharmacy for you to  pick up at your convenience: Xifaxan : Take 3 times a day for 14 days   What Is Microscopic Colitis? Microscopic colitis is a condition that causes chronic, watery diarrhea. Unlike other types of colitis (like ulcerative colitis or Crohns disease), the colon (large intestine) appears normal during a colonoscopy. The inflammation can only be seen under a microscope--hence the name.  There are two main types:  Lymphocytic colitis - increased white blood cells (lymphocytes) in the colon lining Collagenous colitis - thickened layer of collagen (a protein) in the colon lining  Common Symptoms  Ongoing watery diarrhea (often multiple times a day) Urgency to have a bowel movement Abdominal pain or cramping Bloating or gas Fatigue Weight loss (less common)  Causes and Risk Factors The exact cause isnt fully known, but possible contributing factors include:  Immune system reactions  Medications, such as: NSAIDs (e.g., ibuprofen) Proton pump inhibitors (e.g., omeprazole) SSRIs (e.g., sertraline) Smoking Older age (most common in people over 54) Female sex (more common in women)   How Is It Diagnosed?  Colonoscopy: usually appears normal  Biopsy (tiny tissue sample from the colon) is needed to see inflammation under a microscope  Treatment Options Treatment depends on how severe your symptoms are:  Lifestyle & Diet Changes  Avoid trigger foods (e.g., caffeine, dairy, fatty foods) Quit smoking Reduce alcohol Stay hydrated  Medications  Anti-diarrheal meds (like loperamide/Imodium) Budesonide  (a corticosteroid with fewer side effects, often the first choice for moderate-to-severe cases) Bismuth subsalicylate (e.g., Pepto-Bismol) Stopping or switching medications that may be triggering the condition  Go to the ER if any severe abdominal pain, fever, or weakness    Whats the Outlook?  Many people improve with treatment. The condition may come and go. Its not  associated with colon cancer. Regular follow-up helps keep symptoms under control.  Here some information about pelvic floor dysfunction. This may be contributing to some of your symptoms. We will continue with our evaluation but I do want you to consider adding on fiber supplement with low-dose MiraLAX daily. We could also refer to pelvic floor physical therapy.   Pelvic Floor Dysfunction, Female Pelvic floor dysfunction (PFD) is a condition that results when the group of muscles and connective tissues that support the organs in the pelvis (pelvic floor muscles) do not work well. These muscles and their connections form a sling that supports the colon and bladder. In women, they also support the uterus. PFD causes pelvic floor muscles to be too weak, too tight, or both. In PFD, muscle movements are not coordinated. This may cause bowel or bladder problems. It may also cause pain. What are the causes? This condition may be caused by an injury to the pelvic area or by a weakening of pelvic muscles. This often results from pregnancy and childbirth or other types of strain. In many cases, the exact cause is not known. What increases the risk? The following factors may make you more likely to develop this condition: Having chronic bladder tissue inflammation (interstitial cystitis). Being an older person. Being overweight. History of radiation treatment for cancer in the pelvic region. Previous pelvic surgery, such as removal of the uterus (hysterectomy). What are the signs or symptoms? Symptoms of this condition vary and may include: Bladder symptoms, such as: Trouble starting urination and emptying the bladder. Frequent urinary tract infections. Leaking urine when coughing, laughing, or exercising (stress incontinence). Having to pass urine urgently or frequently. Pain when passing urine.  Bowel symptoms, such as: Constipation. Urgent or frequent bowel movements. Incomplete bowel  movements. Painful bowel movements. Leaking stool or gas. Unexplained genital or rectal pain. Genital or rectal muscle spasms. Low back pain. Other symptoms may include: A heavy, full, or aching feeling in the vagina. A bulge that protrudes into the vagina. Pain during or after sex. How is this diagnosed? This condition may be diagnosed based on: Your symptoms and medical history. A physical exam. During the exam, your health care provider may check your pelvic muscles for tightness, spasm, pain, or weakness. This may include a rectal exam and a pelvic exam. In some cases, you may have diagnostic tests, such as: Electrical muscle function tests. Urine flow testing. X-ray tests of bowel function. Ultrasound of the pelvic organs. How is this treated? Treatment for this condition depends on the symptoms. Treatment options include: Physical therapy. This may include Kegel exercises to help relax or strengthen the pelvic floor muscles. Biofeedback. This type of therapy provides feedback on how tight your pelvic floor muscles are so that you can learn to control them. Internal or external massage therapy. A treatment that involves electrical stimulation of the pelvic floor muscles to help control pain (transcutaneous electrical nerve stimulation, or TENS). Sound wave therapy (ultrasound) to reduce muscle spasms. Medicines, such as: Muscle relaxants. Bladder control medicines. Surgery to reconstruct or support pelvic floor muscles may be an option if other treatments do not help. Follow these instructions at home: Activity Do your usual activities as told by your health care provider. Ask your health care provider if you should modify any activities. Do pelvic floor strengthening or relaxing exercises at home as told by your physical therapist. Lifestyle Maintain a healthy weight. Eat foods that are high in fiber, such as beans, whole grains, and fresh fruits and vegetables. Limit foods  that are high in fat and processed sugars, such as fried or sweet foods. Manage stress with relaxation techniques such as yoga or meditation. General instructions If you have problems with leakage: Use absorbable pads or wear padded underwear. Wash frequently with mild soap. Keep your genital and anal area as clean and dry as possible. Ask your health care provider if you should try a barrier cream to prevent skin irritation. Take warm baths to relieve pelvic muscle tension or spasms. Take over-the-counter and prescription medicines only as told by your health care provider. Keep all follow-up visits. How is this prevented? The cause of PFD is not always known, but there are a few things you can do to reduce the risk of developing this condition, including: Staying at a healthy weight. Getting regular exercise. Managing stress. Contact a health care provider if: Your symptoms are not improving with home care. You have signs or symptoms of PFD that get worse at home. You develop new signs or symptoms. You have signs of a urinary tract infection, such as: Fever. Chills. Increased urinary frequency. A burning feeling when urinating. You have not had a bowel movement in 3 days (constipation). Summary Pelvic floor dysfunction results when the muscles and connective tissues in your pelvic floor do not work well. These muscles and their connections form a sling that supports your colon and bladder. In women, they also support the uterus. PFD may be caused by an injury to the pelvic area or by a weakening of pelvic muscles. PFD causes pelvic floor muscles to be too weak, too tight, or a combination of both. Symptoms may vary from person to person. In most  cases, PFD can be treated with physical therapies and medicines. Surgery may be an option if other treatments do not help. This information is not intended to replace advice given to you by your health care provider. Make sure you discuss  any questions you have with your health care provider. Document Revised: 06/29/2020 Document Reviewed: 06/29/2020 Elsevier Patient Education  2022 [employer]  You have been scheduled for a follow up appointment with Alan on 3/2 at 2:30pm. Please arrive 10 minutes early for registration. If you need to reschedule or cancel this appointment please call [phone] as soon as possible. Thank you.  Thank you for entrusting me with your care and for choosing [facility], Alan Coombs, P.A.-C   _______________________________________________________  If your blood pressure at your visit was 140/90 or greater, please contact your primary care physician to follow up on this.  _______________________________________________________  If you are age 14 or older, your body mass index should be between 23-30. Your Body mass index is 29.1 kg/m. If this is out of the aforementioned range listed, please consider follow up with your Primary Care Provider.  If you are age 52 or younger, your body mass index should be between 19-25. Your Body mass index is 29.1 kg/m. If this is out of the aformentioned range listed, please consider follow up with your Primary Care Provider.   ________________________________________________________  The [facility] providers would like to encourage you to use MYCHART to communicate with providers for non-urgent requests or questions.  Due to long hold times on the telephone, sending your provider a message by [facility] may be a faster and more efficient way to get a response.  Please allow 48 business hours for a response.  Please remember that this is for non-urgent requests.  _______________________________________________________  [facility] is using a team-based approach to care.  Your team is made up of your doctor and two to three APPS. Our APPS (Nurse Practitioners and Physician Assistants) work with your physician to ensure care  continuity for you. They are fully qualified to address your health concerns and develop a treatment plan. They communicate directly with your gastroenterologist to care for you. Seeing the Advanced Practice Practitioners on your physician's team can help you by facilitating care more promptly, often allowing for earlier appointments, access to diagnostic testing, procedures, and other specialty referrals.   Due to recent changes in healthcare laws, you may see the results of your imaging and laboratory studies on MyChart before your provider has had a chance to review them.  We understand that in some cases there may be results that are confusing or concerning to you. Not all laboratory results come back in the same time frame and the provider may be waiting for multiple results in order to interpret others.  Please give us  48 hours in order for your provider to thoroughly review all the results before contacting the office for clarification of your results.

## 2024-03-26 NOTE — Progress Notes (Addendum)
 "    03/26/2024 Veronica Valencia [id number] 1955-08-01  Referring provider: Chrystal Lamarr RAMAN, MD Primary GI doctor: Dr. Federico (Dr. Aneita)  ASSESSMENT AND PLAN:  Postprandial diarrhea but can have days without BM, history of lymphocytic colitis, duodenal diverticulosis that can increase SIBO risk, chronic pancreatitis on EUS 12/2017 colonoscopy 7 mm polyp transverse colon, moderate diverticulosis sigmoid descending transverse colon pathology positive for lymphocytic colitis No response to Lialda  or budesonide   (facial swelling eyebrow and eyelash loss) - Trial of bismuth subsalicylate up to 8 tablets daily as needed, especially during travel. Hold bismuth subsalicylate until after rifaximin  course. - Defer bismuth subsalicylate until after SIBO treatment. - Will prescribe rifaximin , Discussed alternatives if rifaximin  not covered. - Discussed bile acid sequestrants as option if diarrhea persists. - Ordered stool pancreatic elastase test. - Ordered stool fat analysis.  Pelvic floor dysfunction possibly contributing to her symptoms Decreased rectal tone and retained stool suggest pelvic floor dysfunction contributing to constipation and overflow diarrhea. Pelvic floor physical therapy may help. - Ordered abdominal radiograph for retained stool evaluation. - Educated on pelvic floor dysfunction and discussed physical therapy.  Cholelithiasis, common bile duct sludge 10//2024 ERCP status post sphincterectomy Status post cholecystectomy 03/2023  Hereditary hemochromatosis/MASH C282Y and H63D positive without iron hepatic deposition on MRI Liver biopsy 03/18/2023  Has seen Dr. Norleen Federico and now following with Dr. Manuelita Novak at [facility]  Chronic pancreatitis seen on EUS at [facility] September 2024 EUS changes of chronic pancreatitis pancreatic duct had regular endoscopic appearance many stones in the gallbladder dilatation of CBD and in common hepatic duct with sludge and a small stone  seen in the distal duct left lobe of the liver no lesion no lymphadenopathy  Personal history of colon polyps Recall colonoscopy 12/2027  Afib/Aflutter Not on blood thinner at this time  Patient Care Team: Chrystal Lamarr RAMAN, MD as PCP - General (Family Medicine) Claudene Victory ORN, MD (Inactive) as PCP - Cardiology (Cardiology)  HISTORY OF PRESENT ILLNESS: 69 y.o. female with a past medical history listed below presents for evaluation of diarrhea.   Last seen in the office 01/02/2023 for elevated LFTs by Dr. Aneita.  Discussed the use of AI scribe software for clinical note transcription with the patient, who gave verbal consent to proceed.  History of Present Illness   Veronica Valencia is a 69 year old female with chronic diarrhea due to lymphocytic colitis and IBS-D who presents for evaluation of persistent diarrhea.  She has a history of lymphocytic colitis, chronic diarrhea, duodenal diverticulum, and had her gallbladder removed in January 2025. She was also diagnosed with hemochromatosis and fatty liver after a liver biopsy. She underwent cholecystectomy in January 2025 at [facility], and during the procedure, a liver biopsy was performed that showed fatty liver, mild to moderate iron deposits, and fibrosis. She also has a history of atrial fibrillation and SVT and currently takes flecainide , diltiazem , and losartan .  Diarrhea is chronic and predominantly occurs in the morning, with stool consistency ranging from liquid to nearly normal. Bowel movements are tracked; absence of a morning bowel movement sometimes leads to nausea and, on occasion, vomiting, especially after several days without a bowel movement. Morning urgency is pronounced, with limited ability to delay defecation. During daily four-mile walks, urgency sometimes necessitates rushing home, and there have been episodes of incontinence, including while traveling. Nocturnal diarrhea is denied. No specific food triggers have been  identified despite analysis. Diarrhea may have worsened since cholecystectomy, though this is uncertain. Pain  and cramping are not significant, but bloating and early satiety are present. No excessive gas, burping, heartburn, or dysphagia. No clear association with fatty foods or meats. Stools are sometimes loose with a shiny or oily appearance and may float, but are not watery.  Previous treatments include budesonide , which caused significant adverse effects (facial and periorbital swelling, eyelash and eyebrow loss, hair loss), and Lialda , which was ineffective. Imodium has been used rarely, resulting in excessive constipation. Pepto Bismol, bile acid sequestrants, Xifaxan , and Flagyl/metronidazole have not been tried.  She is a education officer, environmental, walks approximately four miles daily, and is planning frequent travel, including a three-week cruise and family visits. Occasional dizziness and lower blood pressure are noted, possibly related to dehydration or medication effects.        She  reports that she has never smoked. She has never used smokeless tobacco. She reports current alcohol use. She reports that she does not use drugs.  RELEVANT GI HISTORY, IMAGING AND LABS: Results   Diagnostic Endoscopic ultrasound (EUS): Cholelithiasis and biliary sludge; pancreatic changes consistent with chronic pancreatitis; duodenal diverticulum  Pathology Liver biopsy (03/18/2023): Steatohepatitis; mild to moderate hepatic iron deposition; fibrosis; negative iron stain; hepatic iron index 0.7     03/18/2023 Liver biopsy  Steatohepatitis with mild steatosis (30%), ballooning degeneration, and Mallory Denk bodies. Patchy and moderate iron deposition in hepatocytes, and minimal iron deposition in Kupffer cells. At least periportal fibrosis with pericellular fibrosis (trichrome). Portal tracts are present.  There is a mild mononuclear inflammatory infiltrate in the portal tracts with no plasma cell aggregates.  There is  no spillover of inflammatory cells beyond the limiting plate. Within the portal tracts, interlobular sized bile ducts are intact.  There is no ductular reaction with associated neutrophils.  There is no canalicular cholestasis. There is mild macrovesicular steatosis (30%). There are ballooned hepatocytes with Mallory Denk bodies. Lobular parenchyma shows 2-3 inflammatory foci per 20X field with no acidophil bodies identified. Terminal hepatic venules are present.  Zone 3 sinusoids are not dilated. The iron stain is negative. The PASD stain is negative for intracytoplasmic globules. The reticulin stain highlights an intact reticulin framework. The trichrome stain demonstrates at least periportal with pericellular fibrosis.  Hepatic iron index was 0.7, iron liver tissue 2764  CBC    Component Value Date/Time   WBC 10.3 02/12/2024 1417   WBC 13.1 (H) 08/23/2022 1153   RBC 4.40 02/12/2024 1417   HGB 15.2 (H) 02/12/2024 1417   HCT 43.0 02/12/2024 1417   PLT 252 02/12/2024 1417   MCV 97.7 02/12/2024 1417   MCH 34.5 (H) 02/12/2024 1417   MCHC 35.3 02/12/2024 1417   RDW 11.3 (L) 02/12/2024 1417   LYMPHSABS 1.2 02/12/2024 1417   MONOABS 0.9 02/12/2024 1417   EOSABS 0.3 02/12/2024 1417   BASOSABS 0.1 02/12/2024 1417   Recent Labs    02/12/24 1417  HGB 15.2*    CMP     Component Value Date/Time   NA 135 02/12/2024 1417   K 4.6 02/12/2024 1417   CL 98 02/12/2024 1417   CO2 25 02/12/2024 1417   GLUCOSE 113 (H) 02/12/2024 1417   BUN 12 02/12/2024 1417   CREATININE 1.04 (H) 02/12/2024 1417   CREATININE 1.04 (H) 10/16/2019 1456   CALCIUM 9.6 02/12/2024 1417   PROT 7.6 02/12/2024 1417   ALBUMIN 4.4 02/12/2024 1417   AST 97 (H) 02/12/2024 1417   ALT 50 (H) 02/12/2024 1417   ALKPHOS 188 (H) 02/12/2024 1417   BILITOT  0.7 02/12/2024 1417   GFRNONAA 59 (L) 02/12/2024 1417   GFRAA >60 01/04/2016 1441      Latest Ref Rng & Units 02/12/2024    2:17 PM 12/17/2022    2:50 PM 08/23/2022    11:53 AM  Hepatic Function  Total Protein 6.5 - 8.1 g/dL 7.6  7.3  7.4   Albumin 3.5 - 5.0 g/dL 4.4  4.0  4.0   AST 15 - 41 U/L 97  57  38   ALT 0 - 44 U/L 50  33  34   Alk Phosphatase 38 - 126 U/L 188  184  149   Total Bilirubin 0.0 - 1.2 mg/dL 0.7  0.8  0.6       Current Medications:   Current Outpatient Medications (Cardiovascular):    diltiazem  (CARDIZEM  CD) 180 MG 24 hr capsule, TAKE 1 CAPSULE BY MOUTH EVERY DAY   flecainide  (TAMBOCOR ) 100 MG tablet, Take 1 tablet (100 mg total) by mouth 2 (two) times daily.   losartan  (COZAAR ) 25 MG tablet, Take 25 mg by mouth daily.  Current Outpatient Medications (Other):    Multiple Vitamin (MULTIVITAMIN) tablet, Take 1 tablet by mouth daily.   Omega 3 1000 MG CAPS, Take 1 capsule by mouth daily.   rifaximin  (XIFAXAN ) 550 MG TABS tablet, Take 1 tablet (550 mg total) by mouth 3 (three) times daily for 14 days.   Biotin 5000 MCG CAPS, Take 1 capsule by mouth daily. (Patient not taking: Reported on 03/26/2024)  Medical History:  Past Medical History:  Diagnosis Date   Abnormal exercise tolerance test 11/24/2012   Did not reach 85% maximum heart rate. No ischemic changes noted at 79%. Notably beta blocker was not held; no arrhythmias noted.   Episcleritis of right eye    H/O echocardiogram OS 20/40   EF 60-65% with mild concentric LVH. Normal wall motion. Grade 1 diastolic dysfunction. Trace aortic regurgitation. No clear-cut evidence of murmur source   Hot flashes    Hypertension    Lymphocytic colitis    PAF (paroxysmal atrial fibrillation) [facility]) July 2014   Paroxysmal atrial flutter [facility]) August 2014   Paroxysmal SVT (supraventricular tachycardia) 10/06/2012   This may have simply been misdiagnosed A. fib   Scleritis and episcleritis of right eye 08/28/2012   Allergies: Allergies[1]   Surgical History:  She  has a past surgical history that includes Varicose vein surgery (1990); Cesarean section; Wrist surgery (Left); and Breast  biopsy (Left, 10/2020). Family History:  Her family history includes Alcohol abuse in her maternal grandfather; Breast cancer in her sister; Cancer (age of onset: 57) in her sister; Diabetes in her mother; Healthy in her maternal grandmother; Heart attack (age of onset: 63) in her father; Hypertension in her father and mother; Stroke in her father; Ulcerative colitis in her son.  REVIEW OF SYSTEMS  : All other systems reviewed and negative except where noted in the History of Present Illness.  PHYSICAL EXAM: BP 110/66   Pulse 66   Ht 5' 1 (1.549 m)   Wt 154 lb (69.9 kg)   BMI 29.10 kg/m  Physical Exam   VITALS: BP- 110/66 GENERAL APPEARANCE: Well nourished, in no apparent distress. HEENT: No cervical lymphadenopathy, unremarkable thyroid , sclerae anicteric, conjunctiva pink. RESPIRATORY: Respiratory effort normal, breath sounds equal bilaterally without rales, rhonchi, or wheezing. CARDIO: Regular rate and rhythm with no murmurs, rubs, or gallops, peripheral pulses intact. ABDOMEN: Soft, non-distended, hyperactive bowel sounds in all four quadrants, mild tenderness in left  lower abdomen, no rebound, no mass appreciated. RECTAL: Decreased rectal tone, presence of stool in rectum, anterior hemorrhoidal skin tag, no fissures or masses, hemoccult negative. MUSCULOSKELETAL: Full range of motion, normal gait, without edema. SKIN: Dry, intact without rashes or lesions. No jaundice. NEURO: Alert, oriented, no focal deficits. PSYCH: Cooperative, normal mood and affect.      Alan JONELLE Coombs, PA-C 3:55 PM      [1] No Known Allergies  "

## 2024-03-27 ENCOUNTER — Other Ambulatory Visit

## 2024-03-27 DIAGNOSIS — K580 Irritable bowel syndrome with diarrhea: Secondary | ICD-10-CM

## 2024-03-27 DIAGNOSIS — K52832 Lymphocytic colitis: Secondary | ICD-10-CM

## 2024-03-31 ENCOUNTER — Ambulatory Visit: Payer: Self-pay | Admitting: Physician Assistant

## 2024-03-31 LAB — FECAL FAT, QUALITATIVE
Fat Qual Neutral, Stl: NORMAL
Fat Qual Total, Stl: NORMAL

## 2024-04-01 ENCOUNTER — Other Ambulatory Visit: Payer: Self-pay | Admitting: General Practice

## 2024-04-01 ENCOUNTER — Ambulatory Visit: Admitting: Internal Medicine

## 2024-04-02 LAB — PANCREATIC ELASTASE, FECAL: Pancreatic Elastase-1, Stool: 461 ug/g

## 2024-04-03 NOTE — Addendum Note (Signed)
 Addended by: BLUFORD RAMP D on: 04/03/2024 03:50 PM   Modules accepted: Orders

## 2024-04-07 MED ORDER — FLECAINIDE ACETATE 100 MG PO TABS: 100.0000 mg | ORAL_TABLET | Freq: Two times a day (BID) | ORAL | 0 refills | Status: AC

## 2024-04-07 MED ORDER — DILTIAZEM HCL ER COATED BEADS 180 MG PO CP24: 180.0000 mg | ORAL_CAPSULE | Freq: Every day | ORAL | 0 refills | Status: AC

## 2024-04-07 NOTE — Addendum Note (Signed)
 Addended by: BLUFORD RAMP D on: 04/07/2024 03:02 PM   Modules accepted: Orders

## 2024-04-07 NOTE — Telephone Encounter (Signed)
 Pt's medications were resent to pt's pharmacy as requested. Transmission failed the first time. Confirmation received.

## 2024-05-04 ENCOUNTER — Ambulatory Visit: Admitting: Physician Assistant

## 2024-05-14 ENCOUNTER — Ambulatory Visit: Admitting: Internal Medicine

## 2025-03-17 ENCOUNTER — Inpatient Hospital Stay: Admitting: Hematology and Oncology

## 2025-03-17 ENCOUNTER — Inpatient Hospital Stay
